# Patient Record
Sex: Female | Born: 1975 | Race: White | Hispanic: No | Smoking: Never smoker
Health system: Southern US, Community
[De-identification: ages and names within clinical notes are randomized; demographics above are authoritative.]

## PROBLEM LIST (undated history)

## (undated) DIAGNOSIS — F988 Other specified behavioral and emotional disorders with onset usually occurring in childhood and adolescence: Secondary | ICD-10-CM

## (undated) DIAGNOSIS — H35711 Central serous chorioretinopathy, right eye: Secondary | ICD-10-CM

## (undated) DIAGNOSIS — S92355A Nondisplaced fracture of fifth metatarsal bone, left foot, initial encounter for closed fracture: Secondary | ICD-10-CM

## (undated) DIAGNOSIS — F4310 Post-traumatic stress disorder, unspecified: Secondary | ICD-10-CM

## (undated) DIAGNOSIS — M779 Enthesopathy, unspecified: Secondary | ICD-10-CM

## (undated) DIAGNOSIS — T7840XA Allergy, unspecified, initial encounter: Secondary | ICD-10-CM

## (undated) DIAGNOSIS — E039 Hypothyroidism, unspecified: Secondary | ICD-10-CM

## (undated) DIAGNOSIS — J342 Deviated nasal septum: Secondary | ICD-10-CM

## (undated) DIAGNOSIS — D649 Anemia, unspecified: Secondary | ICD-10-CM

## (undated) DIAGNOSIS — F909 Attention-deficit hyperactivity disorder, unspecified type: Secondary | ICD-10-CM

## (undated) DIAGNOSIS — F329 Major depressive disorder, single episode, unspecified: Secondary | ICD-10-CM

## (undated) DIAGNOSIS — J45909 Unspecified asthma, uncomplicated: Secondary | ICD-10-CM

## (undated) DIAGNOSIS — F32A Depression, unspecified: Secondary | ICD-10-CM

## (undated) DIAGNOSIS — M069 Rheumatoid arthritis, unspecified: Secondary | ICD-10-CM

## (undated) DIAGNOSIS — J329 Chronic sinusitis, unspecified: Secondary | ICD-10-CM

## (undated) HISTORY — DX: Rheumatoid arthritis, unspecified: M06.9

## (undated) HISTORY — DX: Hypothyroidism, unspecified: E03.9

## (undated) HISTORY — DX: Central serous chorioretinopathy, right eye: H35.711

## (undated) HISTORY — PX: TONSILLECTOMY: SUR1361

## (undated) HISTORY — DX: Allergy, unspecified, initial encounter: T78.40XA

## (undated) HISTORY — DX: Post-traumatic stress disorder, unspecified: F43.10

## (undated) HISTORY — DX: Unspecified asthma, uncomplicated: J45.909

## (undated) HISTORY — DX: Deviated nasal septum: J34.2

## (undated) HISTORY — DX: Depression, unspecified: F32.A

## (undated) HISTORY — PX: BACK SURGERY: SHX140

## (undated) HISTORY — DX: Other specified behavioral and emotional disorders with onset usually occurring in childhood and adolescence: F98.8

## (undated) HISTORY — PX: DILATION AND CURETTAGE OF UTERUS: SHX78

## (undated) HISTORY — PX: JOINT REPLACEMENT: SHX530

## (undated) HISTORY — PX: SPINE SURGERY: SHX786

## (undated) HISTORY — DX: Attention-deficit hyperactivity disorder, unspecified type: F90.9

## (undated) HISTORY — DX: Nondisplaced fracture of fifth metatarsal bone, left foot, initial encounter for closed fracture: S92.355A

## (undated) HISTORY — DX: Anemia, unspecified: D64.9

---
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
## 1998-08-12 ENCOUNTER — Other Ambulatory Visit: Admission: RE | Admit: 1998-08-12 | Discharge: 1998-08-12 | Payer: Self-pay | Admitting: Obstetrics and Gynecology

## 1999-08-23 ENCOUNTER — Other Ambulatory Visit: Admission: RE | Admit: 1999-08-23 | Discharge: 1999-08-23 | Payer: Self-pay | Admitting: Obstetrics and Gynecology

## 1999-11-22 ENCOUNTER — Ambulatory Visit (HOSPITAL_COMMUNITY): Admission: RE | Admit: 1999-11-22 | Discharge: 1999-11-22 | Payer: Self-pay | Admitting: Obstetrics and Gynecology

## 1999-11-22 ENCOUNTER — Encounter (INDEPENDENT_AMBULATORY_CARE_PROVIDER_SITE_OTHER): Payer: Self-pay | Admitting: Specialist

## 2000-03-28 ENCOUNTER — Other Ambulatory Visit: Admission: RE | Admit: 2000-03-28 | Discharge: 2000-03-28 | Payer: Self-pay | Admitting: Obstetrics and Gynecology

## 2000-03-28 ENCOUNTER — Encounter (INDEPENDENT_AMBULATORY_CARE_PROVIDER_SITE_OTHER): Payer: Self-pay | Admitting: Specialist

## 2000-07-13 ENCOUNTER — Other Ambulatory Visit: Admission: RE | Admit: 2000-07-13 | Discharge: 2000-07-13 | Payer: Self-pay | Admitting: Obstetrics and Gynecology

## 2000-12-12 DIAGNOSIS — O1413 Severe pre-eclampsia, third trimester: Secondary | ICD-10-CM

## 2001-01-11 ENCOUNTER — Inpatient Hospital Stay (HOSPITAL_COMMUNITY): Admission: AD | Admit: 2001-01-11 | Discharge: 2001-01-14 | Payer: Self-pay | Admitting: Obstetrics and Gynecology

## 2001-03-26 ENCOUNTER — Other Ambulatory Visit: Admission: RE | Admit: 2001-03-26 | Discharge: 2001-03-26 | Payer: Self-pay | Admitting: Obstetrics and Gynecology

## 2003-01-03 ENCOUNTER — Other Ambulatory Visit: Admission: RE | Admit: 2003-01-03 | Discharge: 2003-01-03 | Payer: Self-pay | Admitting: Obstetrics and Gynecology

## 2003-01-12 ENCOUNTER — Inpatient Hospital Stay (HOSPITAL_COMMUNITY): Admission: AD | Admit: 2003-01-12 | Discharge: 2003-01-12 | Payer: Self-pay | Admitting: Obstetrics & Gynecology

## 2003-01-12 ENCOUNTER — Encounter: Payer: Self-pay | Admitting: Obstetrics & Gynecology

## 2003-06-18 ENCOUNTER — Inpatient Hospital Stay (HOSPITAL_COMMUNITY): Admission: AD | Admit: 2003-06-18 | Discharge: 2003-06-18 | Payer: Self-pay | Admitting: Obstetrics and Gynecology

## 2003-06-19 ENCOUNTER — Inpatient Hospital Stay (HOSPITAL_COMMUNITY): Admission: AD | Admit: 2003-06-19 | Discharge: 2003-06-19 | Payer: Self-pay | Admitting: Obstetrics and Gynecology

## 2003-07-09 ENCOUNTER — Encounter (INDEPENDENT_AMBULATORY_CARE_PROVIDER_SITE_OTHER): Payer: Self-pay | Admitting: Specialist

## 2003-07-09 ENCOUNTER — Inpatient Hospital Stay (HOSPITAL_COMMUNITY): Admission: AD | Admit: 2003-07-09 | Discharge: 2003-07-13 | Payer: Self-pay | Admitting: Obstetrics and Gynecology

## 2003-08-07 ENCOUNTER — Other Ambulatory Visit: Admission: RE | Admit: 2003-08-07 | Discharge: 2003-08-07 | Payer: Self-pay | Admitting: Obstetrics and Gynecology

## 2007-10-26 ENCOUNTER — Ambulatory Visit (HOSPITAL_COMMUNITY): Admission: RE | Admit: 2007-10-26 | Discharge: 2007-10-26 | Payer: Self-pay | Admitting: Obstetrics and Gynecology

## 2007-10-26 ENCOUNTER — Encounter (INDEPENDENT_AMBULATORY_CARE_PROVIDER_SITE_OTHER): Payer: Self-pay | Admitting: Obstetrics and Gynecology

## 2009-04-11 ENCOUNTER — Ambulatory Visit: Payer: Self-pay | Admitting: Oncology

## 2009-05-12 ENCOUNTER — Ambulatory Visit: Payer: Self-pay | Admitting: Oncology

## 2011-02-22 ENCOUNTER — Ambulatory Visit: Payer: Self-pay | Admitting: Oncology

## 2011-03-13 ENCOUNTER — Ambulatory Visit: Payer: Self-pay | Admitting: Oncology

## 2011-04-26 NOTE — Op Note (Signed)
[name], [name]               ACCOUNT NO.:  [id number]   MEDICAL RECORD NO.:  [id number]          PATIENT TYPE:  AMB   LOCATION:  SDC                           FACILITY:  [facility]   PHYSICIAN:  Lenoard Aden, M.D.DATE OF BIRTH:  [birth year]   DATE OF PROCEDURE:  10/26/2007  DATE OF DISCHARGE:                               OPERATIVE REPORT   PREOPERATIVE DIAGNOSIS:  Dysfunctional uterine bleeding with structural  lesion.   POSTOPERATIVE DIAGNOSIS:  Endometrial polyp.   PROCEDURE:  Diagnostic hysteroscopy, dilatation and curettage, resection  of endometrial polyp.   SURGEON:  Lenoard Aden, M.D.   ANESTHESIA:  General and local.   ESTIMATED BLOOD LOSS:  Less than 50 cc.   COMPLICATIONS:  None.   DRAINS:  None.   COUNTS:  Correct.   DISPOSITION:  Patient to recovery in good condition.   FLUID DEFICIT:  55 cc   DESCRIPTION OF PROCEDURE:  After being advised of the risks of  anesthesia, infection, bleeding, uterine perforation with possible  injury to other organs with need for repair, the patient was brought to  the operating room.  The patient was administered a general anesthetic  without complications.  Feet were placed in the [place] stirrups,  prepped and draped in the usual sterile fashion.  Catheter placed and  the bladder is emptied.  Exam under anesthesia reveals an anteflexed  size uterus and no adnexal masses.  The cervix is __________.  Paracervical block placed using dilute __________ solution in standard  fashion, 20 cc total, and a block is placed using Pitressin at 3 and 9  o'clock at the cervicovaginal junction, 16 cc total of dilute Pitressin  solution.  At this time the cervix was easily dilated with a #27 Pratt  dilator.  Hysteroscope placed.  Visualization reveals a thickening along  the posterior wall and a posterior wall polyp which is removed easily  with the right angle loop.  Multiple passes are taken to completely  remove the posterior  wall polyp.  D&C is performed in four-quadrant  method without difficulty.  Bilateral normal tubal ostia are noted.  Normal uterine cavity is approached after this.  At this time all  instruments  are removed under direct visualization.  Fluid deficit of 55 cc is  noted.  Minimal bleeding is noted.  All instruments are removed from the  vagina.  The patient tolerated the procedure well and is transferred to  recovery in good condition.      Lenoard Aden, M.D.  Electronically Signed     RJT/MEDQ  D:  10/26/2007  T:  10/27/2007  Job:  [id number]

## 2011-04-26 NOTE — H&P (Signed)
[name], [name]               ACCOUNT NO.:  [id number]   MEDICAL RECORD NO.:  [id number]          PATIENT TYPE:  AMB   LOCATION:  SDC                           FACILITY:  [facility]   PHYSICIAN:  Lenoard Aden, M.D.DATE OF BIRTH:  09/19/1976   DATE OF ADMISSION:  10/26/2007  DATE OF DISCHARGE:                              HISTORY & PHYSICAL   CHIEF COMPLAINT:  Dysfunctional uterine bleeding.   HISTORY:  She is a 35 year old white female G1 with a history of  irregular bleeding and questionable structural abnormality on  sonohysterography who presents for surgical intervention.   MEDICATIONS:  Her medications include Lexapro.   ALLERGIES:  PENICILLIN.   FAMILY HISTORY:  Of cancer, CVA and rheumatoid arthritis.   PERSONAL HISTORY:  Of cesarean section, LEEP for severe dysplasia.   SOCIAL HISTORY:  She is a nonsmoker and nondrinker.  She denies domestic  or physical violence.   PHYSICAL EXAMINATION:  GENERAL:  On physical examination she is a well-  developed, well-nourished white female in no acute distress.  VITAL SIGNS:  Weight of 187, blood pressure 106/82.  HEENT:  Normal.  LUNGS:  Clear.  HEART:  Regular rate and rhythm.  ABDOMEN:  Soft and nontender.  PELVIC:  Pelvic exam reveals a normal size uterus and no adnexal masses.   IMPRESSION:  Dysfunctional uterine bleeding with questionable structural  lesion, questionable endometrial polyp.   PLAN:  Proceed with diagnostic hysteroscopy, D and C, resectoscope,  risks of anesthesia, infection, bleeding, injury to abdominal organs,  need for repair is discussed.  Delayed versus immediate complications to  include bowel and bladder injury noted.  The patient now wishes to  proceed.      Lenoard Aden, M.D.  Electronically Signed     RJT/MEDQ  D:  10/25/2007  T:  10/26/2007  Job:  [id number]

## 2011-04-29 NOTE — Op Note (Signed)
NAMEJACKLIN, [name]                         ACCOUNT NO.:  [id number]   MEDICAL RECORD NO.:  [id number]                   PATIENT TYPE:  INP   LOCATION:  9126                                 FACILITY:  [facility]   PHYSICIAN:  Lenoard Aden, M.D.             DATE OF BIRTH:  [birth year]   DATE OF PROCEDURE:  07/09/2003  DATE OF DISCHARGE:                                 OPERATIVE REPORT   PREOPERATIVE DIAGNOSES:  1. Symptomatic placenta previa with bleeding.  2. A 36-week intrauterine pregnancy with mature L/S:CD ratio.   PROCEDURE:  Emergent low segment transverse cesarean section.   SURGEON:  Lenoard Aden, M.D.   ASSESSMENT:  Maxie Better, M.D.   ANESTHESIA:  Spinal by Octaviano Glow. Pamalee Leyden, M.D.   ESTIMATED BLOOD LOSS:  1500 mL.   COMPLICATIONS:  None.   DRAINS:  Foley.   COUNTS:  Correct.   PATHOLOGY:  Placenta.   Patient to recovery in good condition.   FINDINGS:  A full-term living female, 6 pounds 5 ounces, cord pH of 7.3, to  the newborn nursery.  Anterior complete placenta previa noted, normal tubes  and ovaries noted.   BRIEF OPERATIVE NOTE:  The patient was scheduled for a C-section today at  approximately 1:30.  At approximately 10:15 I received a phone call  regarding heavy bleeding from the patient.  The patient was moved to the  operating room urgently, whereby she was administered a quick spinal  anesthetic after documenting normal fetal heart tones in the 129 beat per  minute range.  After achieving adequate anesthesia, dilute Marcaine placed,  a Pfannenstiel skin incision made with a scalpel, carried down to fascia,  which was nicked and opened transversely using Mayo scissors.  Rectus  muscles dissected sharply off the fascial edge.  At this time the peritoneum  was entered bluntly, the bladder blade placed, visceral peritoneum scored in  a smiling fashion.  No evidence of placenta increta at this time.  The lower  uterine segment appears  well-developed.  The bladder flap is sharply  dissected off the lower uterine segment.  A Kerr hysterotomy incision is  made, revealing active bleeding from transplacental entry.  Transplacental  entry is made off the anterior abdominal wall into amniotic sac, which  reveals clear fluid, transverse positioning is noted.  The baby is converted  to vertex presentation and delivered atraumatically with one pull using a  Mityvac suction.  At this time the pediatricians are in attendance, Apgars  of 9 and 10 are assigned.  Cord blood is collected, cord pH is collected as  previously noted.  The uterus is exteriorized, curetted using a dry lap  pack, and closed in two layers using a 0 Monocryl suture.  Bleeding from the  right lateral aspect of the incision and the middle aspects of the incision  is controlled using interrupted sutures.  Bladder flap is inspected and  found to be hemostatic.  Irrigation is accomplished.  The paracolic gutters  are irrigated, all blood clots are subsequently removed.  Normal tubes and  ovaries are noted.  The uterus has been replaced in the abdominal cavity and  reveals good hemostasis at this time.  Fascia is then closed using a 0  Monocryl in continuous running fashion, skin closed using staples.  The  patient tolerates the procedure well and is transferred to recovery in good  condition.                                                Lenoard Aden, M.D.    RJT/MEDQ  D:  07/09/2003  T:  07/09/2003  Job:  [id number]
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
## 2011-04-29 NOTE — Discharge Summary (Signed)
   [name], [name]                         ACCOUNT NO.:  [id number]   MEDICAL RECORD NO.:  [id number]                   PATIENT TYPE:  INP   LOCATION:  9126                                 FACILITY:  [facility]   PHYSICIAN:  Lenoard Aden, M.D.             DATE OF BIRTH:  09-28-1976   DATE OF ADMISSION:  07/09/2003  DATE OF DISCHARGE:  07/13/2003                                 DISCHARGE SUMMARY   HOSPITAL COURSE:  The patient underwent an uncomplicated primary C-section  for placenta previa on July 09, 2003.  Postoperative anemia noted with a  hemoglobin of 8.1.  The patient was symptomatic, started on iron b.i.d.,  observed.  Bleeding stabilized.  Low-grade temperature normalized.  She was  discharged to home on day #4.  Macrobid, Tylox, iron sulfate b.i.d. given.  Discharge teaching done.  Follow up in the office in four weeks.                                               Lenoard Aden, M.D.    RJT/MEDQ  D:  07/28/2003  T:  07/28/2003  Job:  [id number]

## 2011-04-29 NOTE — H&P (Signed)
[facility] [place]  Patient:    [name]                       MRN: [id number] Adm. Date:  [id number] Attending:  Lenoard Aden                         History and Physical  CHIEF COMPLAINT:              Missed abortion.  HISTORY OF PRESENT ILLNESS:   The patient is a 35 year old white female, G1, P0 at 6 weeks with missed AB.  ALLERGIES:                    Remarkable for allergies to OXACILLINS, PENICILLIN, AMOXICILLIN.  MEDICATIONS:                  Prenatal vitamins.  SOCIAL HISTORY:               Noncontributory.  FAMILY HISTORY:               Noncontributory.  PAST MEDICAL HISTORY:         The patient had had medical hospitalization only or a tonsillectomy.  REVIEW OF SYSTEMS:            GYN review of systems negative.  PHYSICAL EXAMINATION:  GENERAL:                      The patient is a well-developed, well-nourished white female in no apparent distress.  HEENT:                        Normal.  LUNGS:                        Clear.  HEART:                        Regular rate and rhythm.  ABDOMEN:                      Soft, nontender.  PELVIC:                       Examination reveals a 6-8 week size uterus.  No adnexal masses.  EXTREMITIES:                  Reveal no cords.  NEUROLOGICAL:                 Nonfocal.  IMPRESSION:                   A six-week intrauterine pregnancy with fetal demise documented by ultrasound.  PLAN:                          Suction D&E.  Risk of anesthesia, infection, bleeding, uterine perforation, need for repair is discussed and the patient acknowledges and desires to proceed. DD:  11/22/99 TD:  11/22/99 Job: [id number]

## 2011-04-29 NOTE — H&P (Signed)
   [name], [name]                         ACCOUNT NO.:  [id number]   MEDICAL RECORD NO.:  [id number]                   PATIENT TYPE:  INP   LOCATION:  NA                                   [facility]ACILITY:  [facility]   PHYSICIAN:  Lenoard Aden, M.D.             DATE OF BIRTH:  06-04-1976   DATE OF ADMISSION:  07/08/2003  DATE OF DISCHARGE:                                HISTORY & PHYSICAL   CHIEF COMPLAINT:  Placenta previa.   HISTORY OF PRESENT ILLNESS:  A 35 year old white female G3, P1, EDD of  August 02, 2003 at 36 weeks status post amniocentesis with LS PG ratio of  1.4 and PT positive who presents for primary C-section to persistent  complete placenta previa at term.   ALLERGIES:  PENICILLIN.   MEDICATIONS:  Prenatal vitamins.   PAST MEDICAL HISTORY:  History of D&E for missed AB in 2000. History of  spontaneous vaginal delivery in 2002. She has no other medical or surgical  hospitalizations.   FAMILY HISTORY:  History of stroke, emphysema, hypertension, and diabetes.   LABORATORY DATA:  Prenatal lab data reveals a blood type of A positive, Rh  antibody negative, Rubella non-immune, hepatitis and HIV non-reactive. GBS  is negative.   PHYSICAL EXAMINATION:  GENERAL: She is a well developed, well nourished  white female in no acute distress.  HEENT: Normal.  LUNGS: Clear.  HEART: Regular rhythm.  ABDOMEN: Soft, gravid, and nontender.  PELVIC: Cervix is visually closed.  RECTAL: Digital examination is deferred.  EXTREMITIES: Show no cords.  NEUROLOGIC: Examination is non-focal.   IMPRESSION:  1. A 36 week intrauterine pregnancy.  2. Complete placenta previa with mature LS/PG ratio, for primary Cesarean     section.   PLAN:  The plan is to proceed with primary C-section.  The risks of  anesthesia, infection, bleeding, injury to abdominal organs, and need for  repair were discussed and postoperative immediate complications to include  bowel and bladder injury,  possible need for hysterectomy due to persistent  bleeding is discussed. The patient has already  acquired 2 units of blood from donor directed through the [facility] for the  possible need for transfusion. Transfusion risks and benefits are discussed  at this time. Risks of hepatitis and HIV are noted. The patient acknowledges  and wishes to proceed.                                               Lenoard Aden, M.D.    RJT/MEDQ  D:  07/08/2003  T:  07/08/2003  Job:  [id number]   cc:   [facility]

## 2011-09-20 LAB — CBC
HCT: 40.4
Hemoglobin: 14.3
MCHC: 35.3
MCV: 94.4
Platelets: 258
RBC: 4.28
RDW: 12.3
WBC: 7.2

## 2011-09-20 LAB — HCG, SERUM, QUALITATIVE: Preg, Serum: NEGATIVE

## 2011-10-08 ENCOUNTER — Ambulatory Visit: Payer: Self-pay | Admitting: Emergency Medicine

## 2012-01-24 ENCOUNTER — Ambulatory Visit: Payer: Self-pay | Admitting: General Practice

## 2012-05-17 ENCOUNTER — Ambulatory Visit: Payer: Self-pay | Admitting: Oncology

## 2013-12-03 ENCOUNTER — Emergency Department: Payer: Self-pay | Admitting: Emergency Medicine

## 2013-12-06 ENCOUNTER — Emergency Department: Payer: Self-pay | Admitting: Emergency Medicine

## 2014-12-12 DIAGNOSIS — M069 Rheumatoid arthritis, unspecified: Secondary | ICD-10-CM

## 2014-12-12 HISTORY — DX: Rheumatoid arthritis, unspecified: M06.9

## 2015-05-19 ENCOUNTER — Ambulatory Visit
Admission: RE | Admit: 2015-05-19 | Discharge: 2015-05-19 | Disposition: A | Discharge status: Home / Self Care | Payer: No Typology Code available for payment source | Source: Ambulatory Visit | Attending: Family Medicine | Admitting: Family Medicine

## 2015-05-19 ENCOUNTER — Ambulatory Visit
Admission: RE | Admit: 2015-05-19 | Discharge: 2015-05-19 | Disposition: A | Discharge status: Home / Self Care | Payer: No Typology Code available for payment source | Source: Ambulatory Visit | Attending: Physician Assistant | Admitting: Physician Assistant

## 2015-05-19 ENCOUNTER — Other Ambulatory Visit: Payer: Self-pay | Admitting: Physician Assistant

## 2015-05-19 DIAGNOSIS — S8990XA Unspecified injury of unspecified lower leg, initial encounter: Secondary | ICD-10-CM | POA: Insufficient documentation

## 2015-05-19 DIAGNOSIS — R52 Pain, unspecified: Secondary | ICD-10-CM

## 2015-05-19 DIAGNOSIS — M25569 Pain in unspecified knee: Secondary | ICD-10-CM | POA: Diagnosis present

## 2015-05-19 DIAGNOSIS — M25562 Pain in left knee: Secondary | ICD-10-CM | POA: Insufficient documentation

## 2015-06-12 DIAGNOSIS — G959 Disease of spinal cord, unspecified: Secondary | ICD-10-CM | POA: Insufficient documentation

## 2015-06-12 DIAGNOSIS — M4712 Other spondylosis with myelopathy, cervical region: Secondary | ICD-10-CM | POA: Insufficient documentation

## 2015-10-12 ENCOUNTER — Encounter: Payer: Self-pay | Admitting: Physician Assistant

## 2015-10-12 ENCOUNTER — Ambulatory Visit: Payer: Self-pay | Admitting: Physician Assistant

## 2015-10-12 VITALS — BP 120/70 | HR 95 | Temp 98.8°F

## 2015-10-12 DIAGNOSIS — J019 Acute sinusitis, unspecified: Secondary | ICD-10-CM

## 2015-10-12 MED ORDER — CEFDINIR 300 MG PO CAPS: 300.0000 mg | ORAL_CAPSULE | Freq: Two times a day (BID) | ORAL | Status: DC

## 2015-10-12 MED ORDER — FLUCONAZOLE 150 MG PO TABS: 150.0000 mg | ORAL_TABLET | Freq: Once | ORAL | Status: DC

## 2015-10-12 NOTE — Progress Notes (Signed)
S: C/o runny nose and congestion for 8-9 days, no fever, chills, cp/sob, v/d; mucus is green and thick, c/o of facial and dental pain. Scheduled for sinus surgery before xmas  Using otc meds:   O: PE: vitals wnl, nad,  perrl eomi, normocephalic, tms dull, nasal mucosa red and swollen, throat injected, neck supple no lymph, lungs c t a, cv rrr, neuro intact  A:  Acute sinusitis   P: omnicef 300mg  bid x 10d, diflucan 150mg  1 now and 1 in 1 week, repeat if needed drink fluids, continue regular meds , use otc meds of choice, return if not improving in 5 days, return earlier if worsening

## 2015-11-16 ENCOUNTER — Encounter: Payer: Self-pay | Admitting: *Deleted

## 2015-11-18 NOTE — Discharge Instructions (Signed)
[facility] °[facility] °ENDOSCOPIC SINUS SURGERY ° [facility] ° °What is Functional Endoscopic Sinus Surgery? ° The Surgery involves making the natural openings of the sinuses larger by removing the bony partitions that separate the sinuses from the nasal cavity.  The natural sinus lining is preserved as much as possible to allow the sinuses to resume normal function after the surgery.  In some patients nasal polyps (excessively swollen lining of the sinuses) may be removed to relieve obstruction of the sinus openings.  The surgery is performed through the nose using lighted scopes, which eliminates the need for incisions on the face.  A septoplasty is a different procedure which is sometimes performed with sinus surgery.  It involves straightening the boy partition that separates the two sides of your nose.  A crooked or deviated septum may need repair if is obstructing the sinuses or nasal airflow.  Turbinate reduction is also often performed during sinus surgery.  The turbinates are bony proturberances from the side walls of the nose which swell and can obstruct the nose in patients with sinus and allergy problems.  Their size can be surgically reduced to help relieve nasal obstruction. ° °What Can Sinus Surgery Do For Me? ° Sinus surgery can reduce the frequency of sinus infections requiring antibiotic treatment.  This can provide improvement in nasal congestion, post-nasal drainage, facial pressure and nasal obstruction.  Surgery will NOT prevent you from ever having an infection again, so it usually only for patients who get infections 4 or more times yearly requiring antibiotics, or for infections that do not clear with antibiotics.  It will not cure nasal allergies, so patients with allergies may still require medication to treat their allergies after surgery. Surgery may improve headaches related to sinusitis, however, some people will continue to  require medication to control sinus headaches related to allergies.  Surgery will do nothing for other forms of headache (migraine, tension or cluster). ° °What Are the Risks of Endoscopic Sinus Surgery? ° Current techniques allow surgery to be performed safely with little risk, however, there are rare complications that patients should be aware of.  Because the sinuses are located around the eyes, there is risk of eye injury, including blindness, though again, this would be quite rare. This is usually a result of bleeding behind the eye during surgery, which puts the vision oat risk, though there are treatments to protect the vision and prevent permanent disrupted by surgery causing a leak of the spinal fluid that surrounds the brain.  More serious complications would include bleeding inside the brain cavity or damage to the brain.  Again, all of these complications are uncommon, and spinal fluid leaks can be safely managed surgically if they occur.  The most common complication of sinus surgery is bleeding from the nose, which may require packing or cauterization of the nose.  Continued sinus have polyps may experience recurrence of the polyps requiring revision surgery.  Alterations of sense of smell or injury to the tear ducts are also rare complications.  ° °What is the Surgery Like, and what is the Recovery? ° The Surgery usually takes a couple of hours to perform, and is usually performed under a general anesthetic (completely asleep).  Patients are usually discharged home after a couple of hours.  Sometimes during surgery it is necessary to pack the nose to control bleeding, and the packing is left in place for 24 - 48 hours, and removed by your surgeon.    If a septoplasty was performed during the procedure, there is often a splint placed which must be removed after 5-7 days.   °Discomfort: Pain is usually mild to moderate, and can be controlled by prescription pain medication or acetaminophen (Tylenol).   Aspirin, Ibuprofen (Advil, Motrin), or Naprosyn (Aleve) should be avoided, as they can cause increased bleeding.  Most patients feel sinus pressure like they have a bad head cold for several days.  Sleeping with your head elevated can help reduce swelling and facial pressure, as can ice packs over the face.  A humidifier may be helpful to keep the mucous and blood from drying in the nose.  ° °Diet: There are no specific diet restrictions, however, you should generally start with clear liquids and a light diet of bland foods because the anesthetic can cause some nausea.  Advance your diet depending on how your stomach feels.  Taking your pain medication with food will often help reduce stomach upset which pain medications can cause. ° °Nasal Saline Irrigation: It is important to remove blood clots and dried mucous from the nose as it is healing.  This is done by having you irrigate the nose at least 3 - 4 times daily with a salt water solution.  We recommend using NeilMed Sinus Rinse (available at the drug store).  Fill the squeeze bottle with the solution, bend over a sink, and insert the tip of the squeeze bottle into the nose ½ of an inch.  Point the tip of the squeeze bottle towards the inside corner of the eye on the same side your irrigating.  Squeeze the bottle and gently irrigate the nose.  If you bend forward as you do this, most of the fluid will flow back out of the nose, instead of down your throat.   The solution should be warm, near body temperature, when you irrigate.   Each time you irrigate, you should use a full squeeze bottle.  ° °Note that if you are instructed to use Nasal Steroid Sprays at any time after your surgery, irrigate with saline BEFORE using the steroid spray, so you do not wash it all out of the nose. °Another product, Nasal Saline Gel (such as AYR Nasal Saline Gel) can be applied in each nostril 3 - 4 times daily to moisture the nose and reduce scabbing or crusting. ° °Bleeding:   Bloody drainage from the nose can be expected for several days, and patients are instructed to irrigate their nose frequently with salt water to help remove mucous and blood clots.  The drainage may be dark red or brown, though some fresh blood may be seen intermittently, especially after irrigation.  Do not blow you nose, as bleeding may occur. If you must sneeze, keep your mouth open to allow air to escape through your mouth. ° °If heavy bleeding occurs: Irrigate the nose with saline to rinse out clots, then spray the nose 3 - 4 times with Afrin Nasal Decongestant Spray.  The spray will constrict the blood vessels to slow bleeding.  Pinch the lower half of your nose shut to apply pressure, and lay down with your head elevated.  Ice packs over the nose may help as well. If bleeding persists despite these measures, you should notify your doctor.  Do not use the Afrin routinely to control nasal congestion after surgery, as it can result in worsening congestion and may affect healing.  ° ° ° °Activity: Return to work varies among patients. Most patients will be   out of work at least 5 - 7 days to recover.  Patient may return to work after they are off of narcotic pain medication, and feeling well enough to perform the functions of their job.  Patients must avoid heavy lifting (over 10 pounds) or strenuous physical for 2 weeks after surgery, so your employer may need to assign you to light duty, or keep you out of work longer if light duty is not possible.  NOTE: you should not drive, operate dangerous machinery, do any mentally demanding tasks or make any important legal or financial decisions while on narcotic pain medication and recovering from the general anesthetic.  °  °Call Your Doctor Immediately if You Have Any of the Following: °1. Bleeding that you cannot control with the above measures °2. Loss of vision, double vision, bulging of the eye or black eyes. °3. Fever over 101 degrees °4. Neck stiffness with  severe headache, fever, nausea and change in mental state. °You are always encourage to call anytime with concerns, however, please call with requests for pain medication refills during office hours. ° °Office Endoscopy: During follow-up visits your doctor will remove any packing or splints that may have been placed and evaluate and clean your sinuses endoscopically.  Topical anesthetic will be used to make this as comfortable as possible, though you may want to take your pain medication prior to the visit.  How often this will need to be done varies from patient to patient.  After complete recovery from the surgery, you may need follow-up endoscopy from time to time, particularly if there is concern of recurrent infection or nasal polyps. ° °General Anesthesia, Adult, Care After °Refer to this sheet in the next few weeks. These instructions provide you with information on caring for yourself after your procedure. Your health care provider may also give you more specific instructions. Your treatment has been planned according to current medical practices, but problems sometimes occur. Call your health care provider if you have any problems or questions after your procedure. °WHAT TO EXPECT AFTER THE PROCEDURE °After the procedure, it is typical to experience: °· Sleepiness. °· Nausea and vomiting. °HOME CARE INSTRUCTIONS °· For the first 24 hours after general anesthesia: °¨ Have a responsible person with you. °¨ Do not drive a car. If you are alone, do not take public transportation. °¨ Do not drink alcohol. °¨ Do not take medicine that has not been prescribed by your health care provider. °¨ Do not sign important papers or make important decisions. °¨ You may resume a normal diet and activities as directed by your health care provider. °· Change bandages (dressings) as directed. °· If you have questions or problems that seem related to general anesthesia, call the hospital and ask for the anesthetist or  anesthesiologist on call. °SEEK MEDICAL CARE IF: °· You have nausea and vomiting that continue the day after anesthesia. °· You develop a rash. °SEEK IMMEDIATE MEDICAL CARE IF:  °· You have difficulty breathing. °· You have chest pain. °· You have any allergic problems. °  °This information is not intended to replace advice given to you by your health care provider. Make sure you discuss any questions you have with your health care provider. °  °Document Released: 03/06/2001 Document Revised: 12/19/2014 Document Reviewed: 03/28/2012 °Elsevier Interactive Patient Education ©2016 [employer] ° °

## 2015-11-19 ENCOUNTER — Ambulatory Visit: Payer: PRIVATE HEALTH INSURANCE | Admitting: Anesthesiology

## 2015-11-19 ENCOUNTER — Ambulatory Visit
Admission: RE | Admit: 2015-11-19 | Discharge: 2015-11-19 | Disposition: A | Discharge status: Home / Self Care | Payer: PRIVATE HEALTH INSURANCE | Source: Ambulatory Visit | Attending: Otolaryngology | Admitting: Otolaryngology

## 2015-11-19 ENCOUNTER — Encounter
Admission: RE | Disposition: A | Discharge status: Home / Self Care | Payer: Self-pay | Source: Ambulatory Visit | Attending: Otolaryngology

## 2015-11-19 ENCOUNTER — Encounter: Payer: Self-pay | Admitting: *Deleted

## 2015-11-19 DIAGNOSIS — J342 Deviated nasal septum: Secondary | ICD-10-CM | POA: Diagnosis not present

## 2015-11-19 DIAGNOSIS — J320 Chronic maxillary sinusitis: Secondary | ICD-10-CM | POA: Diagnosis not present

## 2015-11-19 DIAGNOSIS — J323 Chronic sphenoidal sinusitis: Secondary | ICD-10-CM | POA: Insufficient documentation

## 2015-11-19 DIAGNOSIS — Z880 Allergy status to penicillin: Secondary | ICD-10-CM | POA: Insufficient documentation

## 2015-11-19 DIAGNOSIS — J321 Chronic frontal sinusitis: Secondary | ICD-10-CM | POA: Insufficient documentation

## 2015-11-19 DIAGNOSIS — J82 Pulmonary eosinophilia, not elsewhere classified: Secondary | ICD-10-CM | POA: Insufficient documentation

## 2015-11-19 HISTORY — PX: FRONTAL SINUS EXPLORATION: SHX6591

## 2015-11-19 HISTORY — PX: MAXILLARY ANTROSTOMY: SHX2003

## 2015-11-19 HISTORY — DX: Chronic sinusitis, unspecified: J32.9

## 2015-11-19 HISTORY — PX: IMAGE GUIDED SINUS SURGERY: SHX6570

## 2015-11-19 HISTORY — DX: Enthesopathy, unspecified: M77.9

## 2015-11-19 HISTORY — PX: SEPTOPLASTY: SHX2393

## 2015-11-19 HISTORY — PX: SPHENOIDECTOMY: SHX2421

## 2015-11-19 SURGERY — SINUS SURGERY, WITH IMAGING GUIDANCE
Anesthesia: General | Site: Nose | Wound class: Clean Contaminated

## 2015-11-19 MED ORDER — FENTANYL CITRATE (PF) 100 MCG/2ML IJ SOLN
INTRAMUSCULAR | Status: DC | PRN
  Administered 2015-11-19: 100 ug via INTRAVENOUS
  Administered 2015-11-19 (×3): 50 ug via INTRAVENOUS

## 2015-11-19 MED ORDER — MIDAZOLAM HCL 5 MG/5ML IJ SOLN
INTRAMUSCULAR | Status: DC | PRN
  Administered 2015-11-19: 2 mg via INTRAVENOUS

## 2015-11-19 MED ORDER — LACTATED RINGERS IV SOLN
INTRAVENOUS | Status: DC
  Administered 2015-11-19: 09:00:00 via INTRAVENOUS

## 2015-11-19 MED ORDER — ACETAMINOPHEN 10 MG/ML IV SOLN
1000.0000 mg | Freq: Once | INTRAVENOUS | Status: AC
  Administered 2015-11-19: 1000 mg via INTRAVENOUS

## 2015-11-19 MED ORDER — OXYMETAZOLINE HCL 0.05 % NA SOLN
2.0000 | Freq: Once | NASAL | Status: AC
  Administered 2015-11-19: 2 via NASAL

## 2015-11-19 MED ORDER — LIDOCAINE HCL (CARDIAC) 20 MG/ML IV SOLN
INTRAVENOUS | Status: DC | PRN
  Administered 2015-11-19: 40 mg via INTRAVENOUS

## 2015-11-19 MED ORDER — PROPOFOL 10 MG/ML IV BOLUS
INTRAVENOUS | Status: DC | PRN
  Administered 2015-11-19: 150 mg via INTRAVENOUS

## 2015-11-19 MED ORDER — OXYCODONE HCL 5 MG PO TABS
5.0000 mg | ORAL_TABLET | ORAL | Status: DC | PRN
  Administered 2015-11-19: 5 mg via ORAL

## 2015-11-19 MED ORDER — CEFAZOLIN SODIUM 1-5 GM-% IV SOLN
1.0000 g | Freq: Once | INTRAVENOUS | Status: AC
  Administered 2015-11-19: 1 g via INTRAVENOUS

## 2015-11-19 MED ORDER — ONDANSETRON HCL 4 MG/2ML IJ SOLN: 4.0000 mg | Freq: Once | INTRAMUSCULAR | Status: DC | PRN

## 2015-11-19 MED ORDER — ROCURONIUM BROMIDE 100 MG/10ML IV SOLN
INTRAVENOUS | Status: DC | PRN
  Administered 2015-11-19: 25 mg via INTRAVENOUS

## 2015-11-19 MED ORDER — PHENYLEPHRINE HCL 0.5 % NA SOLN
NASAL | Status: DC | PRN
  Administered 2015-11-19: 30 mL via TOPICAL

## 2015-11-19 MED ORDER — GLYCOPYRROLATE 0.2 MG/ML IJ SOLN
INTRAMUSCULAR | Status: DC | PRN
  Administered 2015-11-19: 0.1 mg via INTRAVENOUS

## 2015-11-19 MED ORDER — FENTANYL CITRATE (PF) 100 MCG/2ML IJ SOLN: 25.0000 ug | INTRAMUSCULAR | Status: DC | PRN

## 2015-11-19 MED ORDER — DEXAMETHASONE SODIUM PHOSPHATE 4 MG/ML IJ SOLN
INTRAMUSCULAR | Status: DC | PRN
  Administered 2015-11-19: 10 mg via INTRAVENOUS

## 2015-11-19 MED ORDER — LIDOCAINE-EPINEPHRINE 1 %-1:100000 IJ SOLN
INTRAMUSCULAR | Status: DC | PRN
  Administered 2015-11-19: 6 mL
  Administered 2015-11-19: 5 mL

## 2015-11-19 SURGICAL SUPPLY — 47 items
BALLOON SINUPLASTY SYSTEM (BALLOONS) ×1 IMPLANT
BATTERY INSTRU NAVIGATION (MISCELLANEOUS) ×16 IMPLANT
BLADE SURG 15 STRL LF DISP TIS (BLADE) IMPLANT
BLADE SURG 15 STRL SS (BLADE)
BTRY SRG DRVR LF (MISCELLANEOUS) ×12
CANISTER SUCT 1200ML W/VALVE (MISCELLANEOUS) ×4 IMPLANT
CATH IV 18X1 1/4 SAFELET (CATHETERS) ×4 IMPLANT
COAG SUCT 10F 3.5MM HAND CTRL (MISCELLANEOUS) ×3 IMPLANT
COAGULATOR SUCT 8FR VV (MISCELLANEOUS) ×1 IMPLANT
DEVICE INFLATION SEID (MISCELLANEOUS) ×1 IMPLANT
DRAPE HEAD BAR (DRAPES) ×4 IMPLANT
DRESSING NASL FOAM PST OP SINU (MISCELLANEOUS) IMPLANT
DRSG NASAL 4CM NASOPORE (MISCELLANEOUS) IMPLANT
DRSG NASAL FOAM POST OP SINU (MISCELLANEOUS)
GLOVE PI ULTRA LF STRL 7.5 (GLOVE) ×6 IMPLANT
GLOVE PI ULTRA NON LATEX 7.5 (GLOVE) ×3
IRRIGATOR 4MM STR (IRRIGATION / IRRIGATOR) ×4 IMPLANT
IV CATH 18X1 1/4 SAFELET (CATHETERS) ×3
IV NS 500ML (IV SOLUTION) ×4
IV NS 500ML BAXH (IV SOLUTION) ×3 IMPLANT
KIT ROOM TURNOVER OR (KITS) ×4 IMPLANT
NAVIGATION MASK REG  ST (MISCELLANEOUS) ×4 IMPLANT
NDL HYPO 25GX1X1/2 BEV (NEEDLE) ×3 IMPLANT
NDL SPNL 25GX3.5 QUINCKE BL (NEEDLE) IMPLANT
NEEDLE HYPO 25GX1X1/2 BEV (NEEDLE) ×4 IMPLANT
NEEDLE SPNL 25GX3.5 QUINCKE BL (NEEDLE) IMPLANT
NS IRRIG 500ML POUR BTL (IV SOLUTION) ×4 IMPLANT
PACK DRAPE NASAL/ENT (PACKS) ×4 IMPLANT
PACKING NASAL EPIS 4X2.4 XEROG (MISCELLANEOUS) ×2 IMPLANT
PAD GROUND ADULT SPLIT (MISCELLANEOUS) ×4 IMPLANT
PATTIES SURGICAL .5 X3 (DISPOSABLE) ×4 IMPLANT
SET HANDPIECE IRR DIEGO (MISCELLANEOUS) ×4 IMPLANT
SOL ANTI-FOG 6CC FOG-OUT (MISCELLANEOUS) ×3 IMPLANT
SOL FOG-OUT ANTI-FOG 6CC (MISCELLANEOUS) ×1
SPLINT NASAL SEPTAL BLV .50 ST (MISCELLANEOUS) ×3 IMPLANT
STRAP BODY AND KNEE 60X3 (MISCELLANEOUS) ×5 IMPLANT
SUT CHROMIC 3-0 (SUTURE) ×4
SUT CHROMIC 3-0 KS 27XMFL CR (SUTURE) ×3
SUT ETHILON 3-0 KS 30 BLK (SUTURE) ×4 IMPLANT
SUT ETHILON 4-0 (SUTURE)
SUT ETHILON 4-0 FS2 18XMFL BLK (SUTURE)
SUT PLAIN GUT 4-0 (SUTURE) ×4 IMPLANT
SUTURE CHRMC 3-0 KS 27XMFL CR (SUTURE) ×3 IMPLANT
SUTURE ETHLN 4-0 FS2 18XMF BLK (SUTURE) IMPLANT
SYR 3ML LL SCALE MARK (SYRINGE) ×4 IMPLANT
TOWEL OR 17X26 4PK STRL BLUE (TOWEL DISPOSABLE) ×4 IMPLANT
WATER STERILE IRR 500ML POUR (IV SOLUTION) ×4 IMPLANT

## 2015-11-19 NOTE — H&P (Signed)
  H&P has been reviewed and no changes necessary. To be downloaded later. 

## 2015-11-19 NOTE — Op Note (Signed)
11/19/2015  12:51 PM    Harper, Theresa Bamberg  YN:[id number]   Pre-Op Dx:  Septal deviation, chronic bilateral frontal sinusitis, bilateral maxillary sinusitis, bilateral sphenoid sinusitis.  Post-op Dx: Same  Proc: Septoplasty, bilateral endoscopic frontal sinusotomies, bilateral endoscopic sphenoid sinusotomies, bilateral endoscopic maxillary antrostomies, outfracture inferior turbinates, use of image guided system   Surg:  , H  Anes:  GOT  EBL:  100 mL  Comp:  None  Findings:  Inflamed tissues in the middle meatus and around the opening as the maxillary frontals and sphenoid sinuses. Extremely deviated septum to the right side were was buckled upon itself.  Procedure: Patient was given general anesthesia by oral endotracheal intubation. Her nose was prepped using 4-1/2 mL 1% Xylocaine with epi 100,000 for infiltration of the septum and lateral nasal walls. Adenoid pledgets were then placed in the nostrils on both sides after soaking them with 1% Xylocaine with epi 1:100,000. The image guided system was then brought in and the CT scan was downloaded from the disc. The template was applied the face and registered to the system as well. There is 0.6 mm of variance. The suction attachments were then registered and checked there was good alignment bilaterally. She was prepped and draped sterile fashion.  The 0 scope was used first to visualize the nose and passages on both sides septum was markedly deviated to the right side with narrowed airway there. There is big for the maxillary crest into the left side. The septum was addressed first. A left Killian incision was created with elevation of mucoperichondrium on the left side of the quadrangular plate cartilage was buckled over on itself in a J-shaped. The mucoperiosteum was then elevated over the maxillary crest and vomer. The bony cartilaginous junction was split and the mucoperiosteum was elevated over the right side of the vomer and  ethmoid plate. Some of the crooked ethmoid plate was removed to allow the regular plate to swing back toward towards normal position. Some of the posterior inferior quadrangular plate was trimmed as well so was overhanging the maxillary crest. The mucoperiosteum was elevated over the maxillary crest so that some the crooked crest was fractured and removed.  the very deviated vomer was removed as well. The mucosal flaps are then placed back in anatomic position and were sitting in the midline. There is a tear along the spur posteriorly and inferiorly on both sides.  The septum was sutured used a 4 plain gut suture in a through and through whip stitch fashion. This was used to close the [place] incision as well. This also was used to close the tears mucosa inferiorly and posteriorly. The 0 scope was then used to visualize both sides the nose in the airway was clear to the back of the turbinates were slightly enlarged. The inferior turbinates were outfractured to create more visualization room in the nose.  With the 0 scope and the left side the middle turbinate was infractured to evaluate the middle meatus better. The uncinate process was incised with a side biter and then removed with through biting forceps. The 30 scope was used to visualize the axillary antrum and this was widened using through biting forceps and the [facility] microdebrider. While was opening this the bulla of the ethmoid sinus was opened into and this was widened to make sure it would not scar over. The 30 scope was used to visualize anteriorly where the anterior portion of the uncinate process was removed. The a Clarence balloon system was used for  tracking into the frontal sinus duct. The balloon was threaded over the thin lighted wire and then dilated to 12 cm of pressure. When the balloon was removed all forceps were used to remove some tissue around the opening to the frontal sinus duct make sure that this remained clear. The middle  turbinate was then infractured to visualize the ureter turbinate. Inferior and posterior to the edge to the and superior turbinate the opening to the sphenoid sinus was found. This was extremely small. The a Clarence balloon system wire was placed into it and the balloon was then threaded over it dilated to 8 cm of pressure. This opened up the sinus opening further and opening was widened with a small through cutting forceps. The middle turbinate was then infractured again and a cottonoid pledget was placed on the left side to help with vasoconstriction.  The 0 scope was then used to visualize the right side and the procedure was repeated. The middle turbinate was infractured and the uncinate process was incised. The uncinate process was removed with through biting forceps and the [facility] microdebrider. The maxillary antrum was widened with the debrider as well as through biting forceps. The frontal sinus duct was located with the a Clarence balloon system and the balloon was threaded over the wire. It was dilated to 12 cm pressure and then the infant was so widened and bone chips removed using through biting forceps. Again the ethmoid bulla was opened up and cleaned out but the rest of the ethmoid sinuses were not addressed. The middle turbinate was infractured to again visualize the superior turbinate and the opening to the sphenoid sinus. He'll be was hidden but the wire was able to thread through this and then the balloon was threaded over it to widen the opening to the sphenoid sinus. It was dilated to 8 cm. The opening was then widened with through biting forceps was well. The middle turbinate was infractured and a cottonoid pledget was placed in the meatus for vasoconstriction.  The left side was revisited and is good opening into the maxillary antrum, frontal sinus, and the sphenoid sinus. The ethmoid was only partially opened at the ethmoid bulla but the rest was not addressed. Xerogel was placed at the  opening to the frontal sinus duct as well as the maxillary sinus area and help keep the middle turbinate medialized. This was liquefied. The right side was revisualized and again the maxillary frontal and sphenoid sinus openings were clear. The middle turbinate was only partially opened at the ethmoid bulla but the rest was not addressed. Xerogel was again placed the opening to the frontal maxillary sinuses.  Xomed 0.5 mm regular sized splints were then trimmed and placed on both sides nasal septum to help hold it in the midline. These were sewn together with a through and through suture of 3-0 nylon. The patient was awakened and taken to the recovery room in satisfactory condition. There were no operative complications. The 0 scope was placed through the nose both sides there is good airway inferiorly bilaterally.  Dispo:   To PACU to be discharged home  Plan:  To be followed up in the office in 6 days for splint removal. She'll rest at home with her head elevated. She'll use antibodies prednisone and help keep swelling down and prevent infection. She'll use pain medication as necessary. She'll start saline flushes tomorrow and use those frequently until I see her back in 6 days. Call if problems  , H  11/19/2015 12:51 PM

## 2015-11-19 NOTE — Anesthesia Preprocedure Evaluation (Addendum)
Anesthesia Evaluation  Patient identified by MRN, date of birth, ID band  Reviewed: Allergy & Precautions, H&P , NPO status , Patient's Chart, lab work & pertinent test results  Airway Mallampati: II  TM Distance: >3 FB Neck ROM: full    Dental no notable dental hx.    Pulmonary    Pulmonary exam normal        Cardiovascular  Rhythm:regular Rate:Normal     Neuro/Psych    GI/Hepatic   Endo/Other    Renal/GU      Musculoskeletal   Abdominal   Peds  Hematology   Anesthesia Other Findings   Reproductive/Obstetrics                             Anesthesia Physical Anesthesia Plan  ASA: II  Anesthesia Plan: General ETT   Post-op Pain Management:    Induction:   Airway Management Planned:   Additional Equipment:   Intra-op Plan:   Post-operative Plan:   Informed Consent: I have reviewed the patients History and Physical, chart, labs and discussed the procedure including the risks, benefits and alternatives for the proposed anesthesia with the patient or authorized representative who has indicated his/her understanding and acceptance.     Plan Discussed with: CRNA  Anesthesia Plan Comments:         Anesthesia Quick Evaluation  

## 2015-11-19 NOTE — Anesthesia Procedure Notes (Signed)
Procedure Name: Intubation Date/Time: 11/19/2015 10:00 AM Performed by: Mayme Genta Pre-anesthesia Checklist: Patient identified, Emergency Drugs available, Suction available, Patient being monitored and Timeout performed Patient Re-evaluated:Patient Re-evaluated prior to inductionOxygen Delivery Method: Circle system utilized Preoxygenation: Pre-oxygenation with 100% oxygen Intubation Type: IV induction Ventilation: Mask ventilation without difficulty Laryngoscope Size: Miller and 2 Grade View: Grade I Tube type: Oral Rae Tube size: 7.0 mm Number of attempts: 1 Placement Confirmation: ETT inserted through vocal cords under direct vision,  positive ETCO2 and breath sounds checked- equal and bilateral Tube secured with: Tape Dental Injury: Teeth and Oropharynx as per pre-operative assessment

## 2015-11-19 NOTE — Transfer of Care (Signed)
Immediate Anesthesia Transfer of Care Note  Patient: [name]  Procedure(s) Performed: Procedure(s) with comments: IMAGE GUIDED SINUS SURGERY (N/A) - GAVE DISK TO CE CE 09/22 SEPTOPLASTY (N/A) MAXILLARY ANTROSTOMY (Bilateral) FRONTAL SINUS EXPLORATION (Bilateral) SPHENOIDECTOMY (Bilateral)  Patient Location: PACU  Anesthesia Type: General ETT  Level of Consciousness: awake, alert  and patient cooperative  Airway and Oxygen Therapy: Patient Spontanous Breathing and Patient connected to supplemental oxygen  Post-op Assessment: Post-op Vital signs reviewed, Patient's Cardiovascular Status Stable, Respiratory Function Stable, Patent Airway and No signs of Nausea or vomiting  Post-op Vital Signs: Reviewed and stable  Complications: No apparent anesthesia complications

## 2015-11-19 NOTE — Anesthesia Postprocedure Evaluation (Signed)
Anesthesia Post Note  Patient: Maudena Bontemps Bressi  Procedure(s) Performed: Procedure(s) (LRB): IMAGE GUIDED SINUS SURGERY (N/A) SEPTOPLASTY (N/A) MAXILLARY ANTROSTOMY (Bilateral) FRONTAL SINUS EXPLORATION (Bilateral) SPHENOIDECTOMY (Bilateral)  Patient location during evaluation: PACU Anesthesia Type: General Level of consciousness: awake and alert and oriented Pain management: satisfactory to patient Vital Signs Assessment: post-procedure vital signs reviewed and stable Respiratory status: spontaneous breathing, nonlabored ventilation and respiratory function stable Cardiovascular status: blood pressure returned to baseline and stable Postop Assessment: Adequate PO intake and No signs of nausea or vomiting Anesthetic complications: no    Raliegh Ip

## 2015-11-20 ENCOUNTER — Encounter: Payer: Self-pay | Admitting: Otolaryngology

## 2015-11-23 LAB — SURGICAL PATHOLOGY

## 2015-12-31 ENCOUNTER — Ambulatory Visit: Payer: Self-pay | Admitting: Physician Assistant

## 2015-12-31 ENCOUNTER — Encounter: Payer: Self-pay | Admitting: Physician Assistant

## 2015-12-31 VITALS — BP 110/62 | HR 72 | Temp 97.7°F

## 2015-12-31 DIAGNOSIS — J209 Acute bronchitis, unspecified: Secondary | ICD-10-CM

## 2015-12-31 DIAGNOSIS — G4700 Insomnia, unspecified: Secondary | ICD-10-CM

## 2015-12-31 MED ORDER — FLUCONAZOLE 150 MG PO TABS: 150.0000 mg | ORAL_TABLET | Freq: Once | ORAL | Status: DC

## 2015-12-31 MED ORDER — IPRATROPIUM-ALBUTEROL 0.5-2.5 (3) MG/3ML IN SOLN: 3.0000 mL | RESPIRATORY_TRACT | Status: DC | PRN

## 2015-12-31 MED ORDER — AZITHROMYCIN 250 MG PO TABS: ORAL_TABLET | ORAL | Status: DC

## 2015-12-31 MED ORDER — IPRATROPIUM-ALBUTEROL 0.5-2.5 (3) MG/3ML IN SOLN
3.0000 mL | Freq: Once | RESPIRATORY_TRACT | Status: AC
  Administered 2015-12-31: 3 mL via RESPIRATORY_TRACT

## 2015-12-31 MED ORDER — METHYLPREDNISOLONE 4 MG PO TBPK
ORAL_TABLET | ORAL | Status: DC
Start: 2015-12-31 — End: 2016-02-19

## 2015-12-31 MED ORDER — TRAZODONE HCL 150 MG PO TABS: 150.0000 mg | ORAL_TABLET | Freq: Every day | ORAL | Status: DC

## 2015-12-31 NOTE — Progress Notes (Signed)
S: C/o cough and congestion with wheezing and chest pain, chest is sore from coughing, denies fever, chills: mucus is green, cough is  keeping pt awake at night;  denies cardiac type chest pain or sob, v/d, abd pain Remainder ros neg  O: vitals wnl, nad, tms clear, throat injected, neck supple no lymph, lungs with decreased bs, cv rrr, neuro intact, duoneb given, pt states feels better, lungs c t a,   A:  Acute bronchitis   P:  rx medication:  Zpack, medrol dose pack, duoneb nebules (pt has machine at home); refill on trazadone;  use otc meds, tylenol or motrin as needed for fever/chills, return if not better in 3 -5 days, return earlier if worsening

## 2016-01-11 ENCOUNTER — Encounter: Payer: Self-pay | Admitting: Physician Assistant

## 2016-01-11 ENCOUNTER — Ambulatory Visit: Payer: Self-pay | Admitting: Physician Assistant

## 2016-01-11 VITALS — BP 110/70 | HR 80 | Temp 98.4°F

## 2016-01-11 DIAGNOSIS — J069 Acute upper respiratory infection, unspecified: Secondary | ICD-10-CM

## 2016-01-11 MED ORDER — FLUCONAZOLE 150 MG PO TABS: 150.0000 mg | ORAL_TABLET | Freq: Once | ORAL | Status: DC

## 2016-01-11 MED ORDER — LEVOFLOXACIN 500 MG PO TABS: 500.0000 mg | ORAL_TABLET | Freq: Every day | ORAL | Status: DC

## 2016-01-11 NOTE — Progress Notes (Signed)
S: not feeling better, got just a little better while on zpack and steroid pack but 2 days later the sx returned, no fever/chills, cough is dry, has some wheezing, using nebulizer at home  O: vitals wnl, nad, lungs c t a, cv rrr  A: acute uri  P: levaquin, diflucan

## 2016-02-19 ENCOUNTER — Encounter: Payer: Self-pay | Admitting: Physician Assistant

## 2016-02-19 ENCOUNTER — Ambulatory Visit: Payer: Self-pay | Admitting: Physician Assistant

## 2016-02-19 VITALS — BP 123/83 | HR 96 | Temp 98.3°F

## 2016-02-19 DIAGNOSIS — Z299 Encounter for prophylactic measures, unspecified: Secondary | ICD-10-CM

## 2016-02-19 DIAGNOSIS — M79643 Pain in unspecified hand: Secondary | ICD-10-CM

## 2016-02-19 DIAGNOSIS — J018 Other acute sinusitis: Secondary | ICD-10-CM

## 2016-02-19 DIAGNOSIS — J069 Acute upper respiratory infection, unspecified: Secondary | ICD-10-CM

## 2016-02-19 MED ORDER — CEFDINIR 300 MG PO CAPS: 300.0000 mg | ORAL_CAPSULE | Freq: Two times a day (BID) | ORAL | Status: DC

## 2016-02-19 MED ORDER — FLUCONAZOLE 150 MG PO TABS: ORAL_TABLET | ORAL | Status: DC

## 2016-02-19 NOTE — Progress Notes (Signed)
S: C/o runny nose and congestion for 3 days, no fever, chills, cp/sob, v/d; mucus is green and thick, cough is sporadic, c/o of facial and dental pain at left max sinus, also joint pain in hands at thumbs, mother and grandmother both have RA, ? If she has the same, pain when pressure applied to both areas, also ?if could get yearly labs today  O: PE: vitals wnl, nad, perrl eomi, normocephalic, tms dull, nasal mucosa red and swollen, throat injected, neck supple no lymph, lungs c t a, cv rrr, neuro intact, both hands with swollen joints at pip of thumbs, no redness noted  A:  Acute sinusitis, joint pain with family hx of RA   P: omnicef 300mg  bid, diflucan, drink fluids, continue regular meds , use otc meds of choice, return if not improving in 5 days, return earlier if worsening , llabs drawn today, exec panel, vit d, sed rate, RA, ANA, b12 and folate, will refer to rheumatology due to family hx and sx

## 2016-02-20 LAB — VITAMIN D 25 HYDROXY (VIT D DEFICIENCY, FRACTURES): Vit D, 25-Hydroxy: 40.5 ng/mL (ref 30.0–100.0)

## 2016-02-20 LAB — CMP12+LP+TP+TSH+6AC+CBC/D/PLT
ALT: 20 IU/L (ref 0–32)
AST: 24 IU/L (ref 0–40)
Albumin/Globulin Ratio: 2 (ref 1.1–2.5)
Albumin: 4.5 g/dL (ref 3.5–5.5)
Alkaline Phosphatase: 38 IU/L — ABNORMAL LOW (ref 39–117)
BUN/Creatinine Ratio: 11 (ref 9–23)
BUN: 13 mg/dL (ref 6–24)
Basophils Absolute: 0 10*3/uL (ref 0.0–0.2)
Basos: 1 %
Bilirubin Total: 0.7 mg/dL (ref 0.0–1.2)
Calcium: 9.3 mg/dL (ref 8.7–10.2)
Chloride: 101 mmol/L (ref 96–106)
Chol/HDL Ratio: 2.8 ratio units (ref 0.0–4.4)
Cholesterol, Total: 205 mg/dL — ABNORMAL HIGH (ref 100–199)
Creatinine, Ser: 1.19 mg/dL — ABNORMAL HIGH (ref 0.57–1.00)
EOS (ABSOLUTE): 0 10*3/uL (ref 0.0–0.4)
Eos: 0 %
Estimated CHD Risk: <0.5 times avg. (ref 0.0–1.0)
Free Thyroxine Index: 3 (ref 1.2–4.9)
GFR calc Af Amer: 66 mL/min/{1.73_m2} (ref >59)
GFR calc non Af Amer: 57 mL/min/{1.73_m2} — ABNORMAL LOW (ref >59)
GGT: 13 IU/L (ref 0–60)
Globulin, Total: 2.2 g/dL (ref 1.5–4.5)
Glucose: 87 mg/dL (ref 65–99)
HDL: 72 mg/dL (ref >39)
Hematocrit: 38.7 % (ref 34.0–46.6)
Hemoglobin: 12.7 g/dL (ref 11.1–15.9)
Immature Grans (Abs): 0 10*3/uL (ref 0.0–0.1)
Immature Granulocytes: 0 %
Iron: 137 ug/dL (ref 27–159)
LDH: 153 IU/L (ref 119–226)
LDL Calculated: 122 mg/dL — ABNORMAL HIGH (ref 0–99)
Lymphocytes Absolute: 2 10*3/uL (ref 0.7–3.1)
Lymphs: 44 %
MCH: 32 pg (ref 26.6–33.0)
MCHC: 32.8 g/dL (ref 31.5–35.7)
MCV: 98 fL — ABNORMAL HIGH (ref 79–97)
Monocytes Absolute: 0.2 10*3/uL (ref 0.1–0.9)
Monocytes: 5 %
Neutrophils Absolute: 2.3 10*3/uL (ref 1.4–7.0)
Neutrophils: 50 %
Phosphorus: 2.9 mg/dL (ref 2.5–4.5)
Platelets: 290 10*3/uL (ref 150–379)
Potassium: 4 mmol/L (ref 3.5–5.2)
RBC: 3.97 x10E6/uL (ref 3.77–5.28)
RDW: 12.5 % (ref 12.3–15.4)
Sodium: 139 mmol/L (ref 134–144)
T3 Uptake Ratio: 26 % (ref 24–39)
T4, Total: 11.4 ug/dL (ref 4.5–12.0)
TSH: 1.5 u[IU]/mL (ref 0.450–4.500)
Total Protein: 6.7 g/dL (ref 6.0–8.5)
Triglycerides: 57 mg/dL (ref 0–149)
Uric Acid: 4.5 mg/dL (ref 2.5–7.1)
VLDL Cholesterol Cal: 11 mg/dL (ref 5–40)
WBC: 4.6 10*3/uL (ref 3.4–10.8)

## 2016-02-20 LAB — ANA W/REFLEX IF POSITIVE: Anti Nuclear Antibody(ANA): NEGATIVE

## 2016-02-20 LAB — SEDIMENTATION RATE: Sed Rate: 2 mm/hr (ref 0–32)

## 2016-02-20 LAB — B12 AND FOLATE PANEL
Folate: >20 ng/mL (ref >3.0)
Vitamin B-12: 904 pg/mL (ref 211–946)

## 2016-02-20 LAB — RHEUMATOID FACTOR: Rhuematoid fact SerPl-aCnc: <10 IU/mL (ref 0.0–13.9)

## 2016-04-06 ENCOUNTER — Encounter: Payer: Self-pay | Admitting: Physician Assistant

## 2016-04-06 ENCOUNTER — Other Ambulatory Visit: Payer: Self-pay | Admitting: Physician Assistant

## 2016-04-06 ENCOUNTER — Ambulatory Visit: Payer: Self-pay | Admitting: Physician Assistant

## 2016-04-06 VITALS — BP 110/79 | HR 82 | Temp 97.9°F

## 2016-04-06 DIAGNOSIS — J069 Acute upper respiratory infection, unspecified: Secondary | ICD-10-CM

## 2016-04-06 MED ORDER — DOXYCYCLINE HYCLATE 100 MG PO TABS: 100.0000 mg | ORAL_TABLET | Freq: Two times a day (BID) | ORAL | Status: DC

## 2016-04-06 MED ORDER — FLUCONAZOLE 150 MG PO TABS: ORAL_TABLET | ORAL | Status: DC

## 2016-04-06 NOTE — Progress Notes (Signed)
S: c/o hoarse voice and bronchial irritation, cough is dry and harsh, friend has same sx and was eating from same salsa bowl, no fever/chills body aches, did have some night sweats last 2 nights and has been really tired  O: vitals wnl, nad, tms clear, nasal mucosa pink , throat wnl, neck supple no lymph, voice is hoarse, cough is dry, lungs c t a, cv rrr  A: acute uri  P: doxy 100mg  bid, diflucan

## 2016-04-29 ENCOUNTER — Other Ambulatory Visit: Payer: Self-pay | Admitting: Physician Assistant

## 2016-05-02 NOTE — Telephone Encounter (Signed)
Med refill approved, pt uses medication for insomnia

## 2016-05-25 ENCOUNTER — Encounter: Payer: Self-pay | Admitting: Physician Assistant

## 2016-05-25 ENCOUNTER — Ambulatory Visit: Payer: Self-pay | Admitting: Physician Assistant

## 2016-05-25 VITALS — BP 120/70 | HR 81 | Temp 97.6°F

## 2016-05-25 DIAGNOSIS — J4520 Mild intermittent asthma, uncomplicated: Secondary | ICD-10-CM

## 2016-05-25 MED ORDER — MONTELUKAST SODIUM 10 MG PO TABS: 10.0000 mg | ORAL_TABLET | Freq: Every day | ORAL | Status: DC

## 2016-05-25 MED ORDER — FLUTICASONE-SALMETEROL 115-21 MCG/ACT IN AERO: 2.0000 | INHALATION_SPRAY | Freq: Two times a day (BID) | RESPIRATORY_TRACT | Status: DC

## 2016-05-25 MED ORDER — ALBUTEROL SULFATE HFA 108 (90 BASE) MCG/ACT IN AERS: 2.0000 | INHALATION_SPRAY | Freq: Four times a day (QID) | RESPIRATORY_TRACT | Status: DC | PRN

## 2016-05-25 NOTE — Progress Notes (Signed)
S: c/o increased asthma type problems, more wheezing and chest tightness while working out, starts coughing when she laughs, having to use inhaler more, ? If she should be on a longer acting inhaler  O: vitals wnl, nad, ent wnl, neck supple no lymph, lungs c t a, cv rrr, when pt laughs she starts coughing  A: asthma symptoms  P: advair hfa 115/21 bid, singulair 10mg  , albuterol inhaler, f/u with pulmonology

## 2016-05-26 ENCOUNTER — Ambulatory Visit: Payer: Self-pay | Admitting: Physician Assistant

## 2016-06-07 NOTE — Progress Notes (Signed)
Patient ID: [name], female   DOB: [birth year], 40 y.o.   MRN: [id number] Patient has been referred to Dr. Stevenson Clinch at [facility].  Appt is 7/12 @ 9:30.  Patient has been notified and has accepted the appointment.

## 2016-06-07 NOTE — Addendum Note (Signed)
Addended by: Rudene Anda T on: 06/07/2016 10:17 AM   Modules accepted: Orders

## 2016-06-09 ENCOUNTER — Encounter: Payer: Self-pay | Admitting: Physician Assistant

## 2016-06-09 ENCOUNTER — Ambulatory Visit: Payer: Self-pay | Admitting: Physician Assistant

## 2016-06-09 VITALS — BP 120/70 | HR 75 | Temp 98.2°F

## 2016-06-09 DIAGNOSIS — J069 Acute upper respiratory infection, unspecified: Secondary | ICD-10-CM

## 2016-06-09 DIAGNOSIS — J0181 Other acute recurrent sinusitis: Secondary | ICD-10-CM

## 2016-06-09 MED ORDER — LEVOFLOXACIN 500 MG PO TABS: 500.0000 mg | ORAL_TABLET | Freq: Every day | ORAL | Status: DC

## 2016-06-09 MED ORDER — FLUCONAZOLE 150 MG PO TABS: ORAL_TABLET | ORAL | Status: DC

## 2016-06-09 NOTE — Progress Notes (Signed)
S: C/o runny nose and congestion for 5  days, no fever, chills, cp/sob, v/d; mucus is green and thick, cough is sporadic, c/o of facial and dental pain. statesis having a lot of drainage down her throat and its making her voice hoarse  Using otc meds:   O: PE: vitals wnl, nad, perrl eomi, normocephalic, tms dull, nasal mucosa red and swollen, throat injected, neck supple no lymph, lungs c t a, cv rrr, neuro intact  A:  Acute sinusitis   P: levaquin 500mg , diflucan, saline nasal rinse bid, drink fluids, continue regular meds , use otc meds of choice, return if not improving in 5 days, return earlier if worsening

## 2016-06-22 ENCOUNTER — Institutional Professional Consult (permissible substitution): Payer: Self-pay | Admitting: Internal Medicine

## 2016-06-22 ENCOUNTER — Encounter: Payer: Self-pay | Admitting: *Deleted

## 2016-07-08 ENCOUNTER — Ambulatory Visit: Payer: Self-pay | Admitting: Physician Assistant

## 2016-07-08 ENCOUNTER — Encounter: Payer: Self-pay | Admitting: Physician Assistant

## 2016-07-08 DIAGNOSIS — J0181 Other acute recurrent sinusitis: Secondary | ICD-10-CM

## 2016-07-08 MED ORDER — FLUCONAZOLE 150 MG PO TABS: ORAL_TABLET | ORAL | 0 refills | Status: DC

## 2016-07-08 MED ORDER — CEFDINIR 300 MG PO CAPS: 300.0000 mg | ORAL_CAPSULE | Freq: Two times a day (BID) | ORAL | 0 refills | Status: DC

## 2016-07-08 NOTE — Progress Notes (Signed)
S: C/o continued sinus pain and congestion, sx have been ongoing for about a month, had septum fixed few months ago and its not helping, no fever, chills, cp/sob, v/d; mucus is green and thick, c/o of facial  Pain at forehead mainly  Also could have rx for xanax for air flight, has never flown before and is really nervous   Using otc meds:   O: PE: perrl eomi, normocephalic, tms dull, nasal mucosa red and swollen, throat injected, neck supple no lymph, lungs c t a, cv rrr, neuro intact  A:  Acute sinusitis   P: omnicef 300mg  diflucan, xanax 0.5mg  #4 nr; drink fluids, continue regular meds , use otc meds of choice, return if not improving in 5 days, return earlier if worsening , explained to patient that it is imperative that she see ENT, has been on several antibiotics since  Her surgery and feel that she should see them at this time

## 2016-07-13 NOTE — Addendum Note (Signed)
Addended by: Rudene Anda T on: 07/13/2016 03:10 PM   Modules accepted: Orders

## 2016-07-22 ENCOUNTER — Telehealth: Payer: Self-pay | Admitting: Emergency Medicine

## 2016-07-22 NOTE — Telephone Encounter (Signed)
I called the patient to follow-up on the referral that was sent to [facility] clinic on her behave.  Patient expressed that the clinic has called her to set up an appointment. She wasn't able to set up the appointment at the time but will call them back and schedule it.

## 2016-08-19 ENCOUNTER — Encounter: Payer: Self-pay | Admitting: Physician Assistant

## 2016-08-19 ENCOUNTER — Ambulatory Visit: Payer: Self-pay | Admitting: Physician Assistant

## 2016-08-19 VITALS — BP 119/64 | HR 79 | Temp 97.8°F

## 2016-08-19 DIAGNOSIS — M25511 Pain in right shoulder: Secondary | ICD-10-CM

## 2016-08-19 DIAGNOSIS — N926 Irregular menstruation, unspecified: Secondary | ICD-10-CM

## 2016-08-19 DIAGNOSIS — L689 Hypertrichosis, unspecified: Secondary | ICD-10-CM

## 2016-08-19 DIAGNOSIS — R635 Abnormal weight gain: Secondary | ICD-10-CM

## 2016-08-19 MED ORDER — DICLOFENAC SODIUM 1 % TD GEL: 2.0000 g | Freq: Four times a day (QID) | TRANSDERMAL | 6 refills | Status: DC

## 2016-08-19 MED ORDER — HYDROCODONE-ACETAMINOPHEN 5-325 MG PO TABS: 1.0000 | ORAL_TABLET | Freq: Four times a day (QID) | ORAL | 0 refills | Status: DC | PRN

## 2016-08-19 MED ORDER — MELOXICAM 15 MG PO TABS: 15.0000 mg | ORAL_TABLET | Freq: Every day | ORAL | 6 refills | Status: DC

## 2016-08-19 NOTE — Progress Notes (Signed)
S: c/o r clavicular pain, old injury where it was dislocated, having a lot of pain and swelling, feels like its out of place, hurts to do her hair, put on shirt, reach to side etc, also ? What to do , ?endocrinology or other doctor to check her hormones, states she has weight gain, facial hair, irregular periods, states her tsh was borderline last time  O: vitals wnl, nad, r clavicle is swollen and medial aspect is protruding, pt becomes tearful with movement of shoulder, neck supple , lungs c t a, cv rrr  A: clavicular pain, weight gain and facial hair  P: mobic, voltaren gel, vicodin 5/325 #20 nr, pt to f/u with [facility] to have hormones checked

## 2016-09-15 ENCOUNTER — Encounter: Payer: Self-pay | Admitting: Physician Assistant

## 2016-09-15 ENCOUNTER — Ambulatory Visit: Payer: Self-pay | Admitting: Physician Assistant

## 2016-09-15 VITALS — BP 136/75 | HR 80 | Temp 98.3°F

## 2016-09-15 DIAGNOSIS — R531 Weakness: Secondary | ICD-10-CM

## 2016-09-15 LAB — POCT INFLUENZA A/B
Influenza A, POC: NEGATIVE
Influenza B, POC: NEGATIVE

## 2016-09-15 NOTE — Progress Notes (Signed)
   Subjective: Flu symptoms    Patient ID: [name], female    DOB: 1975/12/13, 40 y.o.   MRN: [id number]  HPI Patient c/o 2 days of nasal congestion, ear pressure, sore throat, cough, and Body ache. States intermitting fever//chills. Denies vomiting or diarrhea. Increasing myalgia and arthralgia. Exposed to Flu via husband. 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   Review of Systems Hyperthyroidism, Arthritis and Asthma.    Objective:   Physical Exam HEENT for bilateral maxillary guarding, edematous nasal turbinates, clear rhinorrhea, and post nasal drainage. Neck supple without adenopathy. Lungs CTA with non-productive cough. Heart RRR.  Rapid Flu negative.       Assessment & Plan:Viral illness  Tamiflu, Allergra-D, Tessalon, and Naproxen. Follow up with PCP.

## 2016-11-21 ENCOUNTER — Encounter: Payer: Self-pay | Admitting: Physician Assistant

## 2016-11-21 ENCOUNTER — Ambulatory Visit: Payer: Self-pay | Admitting: Physician Assistant

## 2016-11-21 VITALS — BP 120/70 | HR 79 | Temp 97.8°F

## 2016-11-21 DIAGNOSIS — J0120 Acute ethmoidal sinusitis, unspecified: Secondary | ICD-10-CM

## 2016-11-21 MED ORDER — METHYLPREDNISOLONE 4 MG PO TBPK: ORAL_TABLET | ORAL | 0 refills | Status: DC

## 2016-11-21 MED ORDER — LEVOFLOXACIN 500 MG PO TABS: 500.0000 mg | ORAL_TABLET | Freq: Every day | ORAL | 0 refills | Status: DC

## 2016-11-21 MED ORDER — FLUCONAZOLE 150 MG PO TABS: ORAL_TABLET | ORAL | 0 refills | Status: DC

## 2016-11-21 NOTE — Progress Notes (Signed)
S: C/o runny nose and congestion with a lot of sinus pain /pressure for 5-6 days, no fever, chills, cp/sob, v/d; mucus is green and thick, cough is sporadic, c/o of facial and dental pain. More pain in t zone  Using otc meds:   O: PE: vitals wnl, nad, perrl eomi, normocephalic, tms dull, nasal mucosa red and swollen, throat injected, neck supple no lymph, lungs c t a, cv rrr, neuro intact  A:  Acute sinusitis   P: drink fluids, continue regular meds , use otc meds of choice, return if not improving in 5 days, return earlier if worsening, levaquin, medrol dose, diflucan

## 2016-12-09 ENCOUNTER — Ambulatory Visit (INDEPENDENT_AMBULATORY_CARE_PROVIDER_SITE_OTHER): Payer: Managed Care, Other (non HMO) | Admitting: Certified Nurse Midwife

## 2016-12-09 ENCOUNTER — Encounter: Payer: Self-pay | Admitting: Certified Nurse Midwife

## 2016-12-09 VITALS — BP 110/77 | HR 87 | Ht 67.0 in | Wt 159.4 lb

## 2016-12-09 DIAGNOSIS — M069 Rheumatoid arthritis, unspecified: Secondary | ICD-10-CM

## 2016-12-09 DIAGNOSIS — Z01419 Encounter for gynecological examination (general) (routine) without abnormal findings: Secondary | ICD-10-CM | POA: Diagnosis not present

## 2016-12-09 DIAGNOSIS — M2550 Pain in unspecified joint: Secondary | ICD-10-CM | POA: Insufficient documentation

## 2016-12-09 HISTORY — DX: Rheumatoid arthritis, unspecified: M06.9

## 2016-12-09 MED ORDER — NORETHINDRONE ACET-ETHINYL EST 1-20 MG-MCG PO TABS: 1.0000 | ORAL_TABLET | Freq: Every day | ORAL | 11 refills | Status: DC

## 2016-12-09 NOTE — Progress Notes (Signed)
Pt is here for a regular checkup. States she has a PCP that does her labs. Was recently dx with hypothyroidism. Meds are still being adjusted. Her last pap smear was 10/2015. Last mammogram was 12/2015 and both had neg results.

## 2016-12-09 NOTE — Progress Notes (Signed)
ANNUAL PREVENTATIVE CARE GYN  ENCOUNTER NOTE  Subjective:       [name] is a 40 y.o. G64P1101 female here for a routine annual gynecologic exam.    Pt was previously seen at [facility] in [place].   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 Lives with her 5 y.o. Son on and 61 y.o. Daughter. She has been a single parent since they were [age] and [age] years of age.   She has been in a relationship with the same boyfriend for the last 2 (two) years. He resides in [place], so she travels frequently to see him.    [name] was recently diagnosed with hypothyroidism, which is being managed by her PCP. She is starting to feel better and is in the process of "getting back to her normal self'.   Gynecologic History Patient's last menstrual period was 12/07/2016 (exact date). Contraception: OCP (estrogen/progesterone) Last Pap: 10/2015. Results were: normal Last mammogram: 12/2015. Results were: normal  Obstetric History OB History  Gravida Para Term Preterm AB Living  3 2 1 1   1   SAB TAB Ectopic Multiple Live Births          1    # Outcome Date GA Lbr Len/2nd Weight Sex Delivery Anes PTL Lv  3 Preterm 2004   6 lb 5 oz (2.863 kg) F CS-Unspec  Y LIV     Complications: Placenta Previa  2 Term 2002   7 lb 9 oz (3.43 kg) M Vag-Spont  N      Complications: Preeclampsia, severe, third trimester  1 Gravida               Past Medical History:  Diagnosis Date  . Bone spur    "front of spine" - effects right shoulder (sometimes)  . Deviated nasal septum   . RA (rheumatoid arthritis) ([place]) 2016  . Sinusitis   . Thyroid disease     Past Surgical History:  Procedure Laterality Date  . CESAREAN SECTION    . DILATION AND CURETTAGE OF UTERUS     (x2)  . FRONTAL SINUS EXPLORATION Bilateral 11/19/2015   Procedure: FRONTAL SINUS EXPLORATION;  Surgeon: Margaretha Sheffield, MD;  Location: [place];  Service: ENT;  Laterality: Bilateral;  . IMAGE GUIDED SINUS SURGERY N/A 11/19/2015    Procedure: IMAGE GUIDED SINUS SURGERY;  Surgeon: Margaretha Sheffield, MD;  Location: [place];  Service: ENT;  Laterality: N/A;  GAVE DISK TO CE CE 09/22  . MAXILLARY ANTROSTOMY Bilateral 11/19/2015   Procedure: MAXILLARY ANTROSTOMY;  Surgeon: Margaretha Sheffield, MD;  Location: [place];  Service: ENT;  Laterality: Bilateral;  . SEPTOPLASTY N/A 11/19/2015   Procedure: SEPTOPLASTY;  Surgeon: Margaretha Sheffield, MD;  Location: [place];  Service: ENT;  Laterality: N/A;  . SPHENOIDECTOMY Bilateral 11/19/2015   Procedure: [name];  Surgeon: Margaretha Sheffield, MD;  Location: [place];  Service: ENT;  Laterality: Bilateral;  . TONSILLECTOMY      Current Outpatient Prescriptions on File Prior to Visit  Medication Sig Dispense Refill  . albuterol (PROVENTIL HFA;VENTOLIN HFA) 108 (90 Base) MCG/ACT inhaler Inhale 2 puffs into the lungs every 6 (six) hours as needed for wheezing or shortness of breath. 1 Inhaler 12  . diclofenac sodium (VOLTAREN) 1 % GEL Apply 2 g topically 4 (four) times daily. 100 g 6  . fluconazole (DIFLUCAN) 150 MG tablet Take one now and one in a week 2 tablet 0  . ipratropium-albuterol (DUONEB) 0.5-2.5 (3)  MG/3ML SOLN Take 3 mLs by nebulization every 4 (four) hours as needed. 360 mL 3  . liothyronine (CYTOMEL) 5 MCG tablet Take 20 mcg by mouth daily.     Marland Kitchen MAGNESIUM PO Take by mouth.    . Multiple Vitamin (MULTIVITAMIN) tablet Take 1 tablet by mouth daily.    . Omega-3 Fatty Acids (FISH OIL PO) Take by mouth.    Marland Kitchen POTASSIUM PO Take by mouth.    . traZODone (DESYREL) 150 MG tablet TAKE 1/2 TO 1 TABLET AT BEDTIME 30 tablet 6   No current facility-administered medications on file prior to visit.     Allergies  Allergen Reactions  . Penicillins Swelling    Pt states caused throat to swell   . Cefdinir Palpitations  . Sulfa Antibiotics Other (See Comments)    Lips tingled    Social History   Social History  . Marital status: Divorced    Spouse  name: N/A  . Number of children: N/A  . Years of education: N/A   Occupational History  . Not on file.   Social History Main Topics  . Smoking status: Never Smoker  . Smokeless tobacco: Never Used  . Alcohol use 0.0 oz/week     Comment: rare  . Drug use: No  . Sexual activity: Yes    Birth control/ protection: Pill   Other Topics Concern  . Not on file   Social History Narrative  . No narrative on file    Family History  Problem Relation Age of Onset  . Rheum arthritis Mother     The following portions of the patient's history were reviewed and updated as appropriate: allergies, current medications, past family history, past medical history, past social history, past surgical history and problem list.  Review of Systems ROS Review of Systems -   General ROS: negative for - chills, fatigue, fever, hot flashes, night sweats, weight gain or weight loss  Psychological ROS: negative for - anxiety, decreased libido, depression, mood swings, physical abuse or sexual abuse  ENT ROS: negative for - headaches, hearing change, visual changes or vocal changes  Endocrine ROS: negative for - galactorrhea, hair pattern changes, hot flashes, malaise/lethargy, mood swings, palpitations, polydipsia/polyuria, skin changes, temperature intolerance or unexpected weight changes  Breast ROS: negative for - new or changing breast lumps or nipple discharge  Respiratory ROS: negative for - cough or shortness of breath  Cardiovascular ROS: negative for - chest pain, irregular heartbeat, palpitations or shortness of breath  Gastrointestinal ROS: no abdominal pain, change in bowel habits, or black or bloody stools  Genito-Urinary ROS: no dysuria, trouble voiding, or hematuria   Dermatological ROS: negative for rash and skin lesion changes   Objective:   BP 110/77   Pulse 87   Ht 5\' 7"  (1.702 m)   Wt 159 lb 6 oz (72.3 kg)   LMP 12/07/2016 (Exact Date)   BMI 24.96 kg/m   CONSTITUTIONAL: Well groomed female in no acute distress.   PSYCHIATRIC: Normal mood and affect. Normal behavior.   [place]: Alert and oriented to person, place, and time. Normal muscle tone coordination.  HENT:  Normocephalic, atraumatic   NECK: Normal range of motion, supple, no masses.  Normal thyroid.   SKIN: Skin is warm and dry. No rash noted. Not diaphoretic. No erythema. No pallor. Seven professional tattoos.   CARDIOVASCULAR: Normal heart rate noted, regular rhythm, no murmur.  RESPIRATORY: Clear to auscultation bilaterally.   BREASTS: Symmetric in size. No masses, skin changes,  nipple drainage, or lymphadenopathy.  ABDOMEN: Soft, normal bowel sounds, no distention noted.  No tenderness, rebound or guarding.   PELVIC:  External Genitalia: Normal  Vagina: Normal  Cervix: Normal  Uterus: Normal  Adnexa: Normal  RV: Not examined   MUSCULOSKELETAL: Normal range of motion. No tenderness.     Assessment:   Annual gynecologic examination 40 y.o. Contraception: OCP (estrogen/progesterone) Normal BMI Problem List Items Addressed This Visit    None      Plan:  Pap: Not needed  Mammogram: 1-2 years   Birth control refill: Microgestin 1/20 per orders  Routine preventative health maintenance measures emphasized: skin exams and use of birth control  Return to [facility] or sooner if needed.   Diona Fanti, CNM

## 2016-12-09 NOTE — Patient Instructions (Signed)
Mammogram A mammogram is an X-ray of the breasts that is done to check for abnormal changes. This procedure can screen for and detect any changes that may suggest breast cancer. A mammogram can also identify other changes and variations in the breast, such as:  Inflammation of the breast tissue (mastitis).  An infected area that contains a collection of pus (abscess).  A fluid-filled sac (cyst).  Fibrocystic changes. This is when breast tissue becomes denser, which can make the tissue feel rope-like or uneven under the skin.  Tumors that are not cancerous (benign).  Tell a health care provider about:  Any allergies you have.  If you have breast implants.  If you have had previous breast disease, biopsy, or surgery.  If you are breastfeeding.  Any possibility that you could be pregnant, if this applies.  If you are younger than age 25.  If you have a family history of breast cancer. What are the risks? Generally, this is a safe procedure. However, problems may occur, including:  Exposure to radiation. Radiation levels are very low with this test.  The results being misinterpreted.  The need for further tests.  The inability of the mammogram to detect certain cancers.  What happens before the procedure?  Schedule your test about 1-2 weeks after your menstrual period. This is usually when your breasts are the least tender.  If you have had a mammogram done at a different facility in the past, get the mammogram X-rays or have them sent to your current exam facility in order to compare them.  Wash your breasts and under your arms the day of the test.  Do not wear deodorants, perfumes, lotions, or powders anywhere on your body on the day of the test.  Remove any jewelry from your neck.  Wear clothes that you can change into and out of easily. What happens during the procedure?  You will undress from the waist up and put on a gown.  You will stand in front of the  X-ray machine.  Each breast will be placed between two plastic or glass plates. The plates will compress your breast for a few seconds. Try to stay as relaxed as possible during the procedure. This does not cause any harm to your breasts and any discomfort you feel will be very brief.  X-rays will be taken from different angles of each breast. The procedure may vary among health care providers and hospitals. What happens after the procedure?  The mammogram will be examined by a specialist (radiologist).  You may need to repeat certain parts of the test, depending on the quality of the images. This is commonly done if the radiologist needs a better view of the breast tissue.  Ask when your test results will be ready. Make sure you get your test results.  You may resume your normal activities. This information is not intended to replace advice given to you by your health care provider. Make sure you discuss any questions you have with your health care provider. Document Released: 11/25/2000 Document Revised: 05/02/2016 Document Reviewed: 02/06/2015 Elsevier Interactive Patient Education  2017 Elsevier Inc.  

## 2017-01-02 ENCOUNTER — Ambulatory Visit: Payer: Self-pay | Admitting: Family

## 2017-01-02 VITALS — BP 110/70 | HR 89 | Temp 98.2°F

## 2017-01-02 DIAGNOSIS — J069 Acute upper respiratory infection, unspecified: Secondary | ICD-10-CM

## 2017-01-03 ENCOUNTER — Encounter: Payer: Self-pay | Admitting: Family

## 2017-01-03 NOTE — Progress Notes (Signed)
S/  2 d hx of  persistant dry cough, hoarseness, scratchy throat , generalised malaise , little nasal sxs  Has had to use her inhaler  Regularly but no more than directed.   O VSS. NAD  ENT unremarkable,  Neck supple , heart rsr lungs are clear A/ viral resp  Illness P we discussed  pred pulse - declined due to side effects .  .  viral course discussed and supportive measures encouraged. F/u prn not improving per viral timeline.

## 2017-01-05 ENCOUNTER — Encounter: Payer: Self-pay | Admitting: Certified Nurse Midwife

## 2017-02-07 ENCOUNTER — Ambulatory Visit: Payer: Self-pay | Admitting: Physician Assistant

## 2017-02-07 ENCOUNTER — Encounter: Payer: Self-pay | Admitting: Physician Assistant

## 2017-02-07 VITALS — BP 110/80 | HR 91 | Temp 98.7°F

## 2017-02-07 DIAGNOSIS — Z7689 Persons encountering health services in other specified circumstances: Secondary | ICD-10-CM

## 2017-02-07 NOTE — Progress Notes (Signed)
S: pt has been going to [facility] in [place], been treated for abnormal reverse t3; states they said the 56mcg of her medication was the highest they felt comfortable doing, states she still doesn't feel well, would like to be referred to someone at [facility], her RA doctor is at [facility] and her orthopedic doctor is there also, pt has copy of her labs showing abnormal high levels of reverse t3; states she is still tired and still has bags under her eyes  O: vitals wnl nad, skin with "shiners" under both eyes, n/v intact  A: endocrinology referral  P: refer to endocrinology at [facility]

## 2017-02-13 ENCOUNTER — Encounter: Payer: Self-pay | Admitting: Physician Assistant

## 2017-02-13 ENCOUNTER — Ambulatory Visit: Payer: Self-pay | Admitting: Physician Assistant

## 2017-02-13 VITALS — BP 100/65 | HR 82 | Temp 98.5°F

## 2017-02-13 DIAGNOSIS — J209 Acute bronchitis, unspecified: Secondary | ICD-10-CM

## 2017-02-13 NOTE — Progress Notes (Signed)
Per Randel Pigg, PA authorization I called in a Diflucan 150mg  in to [place].

## 2017-02-13 NOTE — Progress Notes (Signed)
   Subjective:cough    Patient ID: [name], female    DOB: 1976/12/09, 41 y.o.   MRN: [id number]  HPI Patient states 4 days of non-productive cough/wheezing, fever, and fatigue. Decrease voice volume. No palliataive measures for compliant.   Review of Systems Negative except for compliant.    Objective:   Physical Exam HEENT unremarkable. Neck supple w/o adenopathy. Lungs with bilateral Rales. Heart RRR.       Assessment & Plan:Bronchitis  Cedinir, Tessalon, Medrol Dose pack, and Tussion to take at night. Work excuse given for 2 days. Follow up PRN.

## 2017-02-24 NOTE — Progress Notes (Signed)
Patient called and expressed that she has developed a yeast infection since Randel Pigg prescribed her an antibiotic.  Per Susan's authorization I called in a Diflucan 150 in to [place].

## 2017-03-16 ENCOUNTER — Ambulatory Visit: Payer: Self-pay | Admitting: Physician Assistant

## 2017-03-16 ENCOUNTER — Encounter: Payer: Self-pay | Admitting: Physician Assistant

## 2017-03-16 VITALS — BP 124/60 | HR 85 | Temp 98.7°F

## 2017-03-16 DIAGNOSIS — L03019 Cellulitis of unspecified finger: Secondary | ICD-10-CM

## 2017-03-16 MED ORDER — MUPIROCIN 2 % EX OINT: 1.0000 "application " | TOPICAL_OINTMENT | Freq: Two times a day (BID) | CUTANEOUS | 0 refills | Status: DC

## 2017-03-16 NOTE — Progress Notes (Signed)
S: c/o swelling at left middle finger along cuticle, been soaking it in warm water and epsom salt, now area is a little itchy, no drainage, no fever/chillls  O: vitals wnl, nad, skin with slight red area at cuticle, no pus noted, area is not fluctuant, full rom of finger, n/v intact  A: paronychia  P: bactroban ointment, if worsening rx for doxy bid

## 2017-03-30 DIAGNOSIS — E039 Hypothyroidism, unspecified: Secondary | ICD-10-CM | POA: Insufficient documentation

## 2017-04-10 ENCOUNTER — Ambulatory Visit: Payer: Managed Care, Other (non HMO) | Attending: Orthopedic Surgery

## 2017-04-10 ENCOUNTER — Ambulatory Visit: Payer: Self-pay | Admitting: Physician Assistant

## 2017-04-10 ENCOUNTER — Encounter: Payer: Self-pay | Admitting: Physician Assistant

## 2017-04-10 VITALS — BP 120/70 | HR 87 | Temp 98.2°F

## 2017-04-10 DIAGNOSIS — R5383 Other fatigue: Secondary | ICD-10-CM

## 2017-04-10 DIAGNOSIS — M25511 Pain in right shoulder: Secondary | ICD-10-CM | POA: Insufficient documentation

## 2017-04-10 DIAGNOSIS — M25611 Stiffness of right shoulder, not elsewhere classified: Secondary | ICD-10-CM | POA: Diagnosis present

## 2017-04-10 DIAGNOSIS — G8929 Other chronic pain: Secondary | ICD-10-CM | POA: Insufficient documentation

## 2017-04-10 NOTE — Progress Notes (Signed)
S: pt states she was seen at [facility] for rheumatology, was told she might have pernicious anemia and should be checked by her pcp, also states they took 17 vials of blood just 2 weeks ago, did about 37 tests, also gets a fluttering in her chest, only last for a second or 2 and corrects itself, no sob, cp  O: vitals wnl, nad, lungs c t a, cv rrr  A: fatigue, abnormal lab values  P: return in 2 weeks to recheck cbc, b12, thyroid panel

## 2017-04-10 NOTE — Therapy (Signed)
[facility] [address], [place], [address] Phone: [phone]   Fax:  [phone]  Physical Therapy Treatment  Patient Details  Name: [name] MRN: [id number] Date of Birth: 07-13-1976 Referring Provider: Stark Jock MD  Encounter Date: 04/10/2017      PT End of Session - 04/10/17 1638    Visit Number 1   Number of Visits 12   Date for PT Re-Evaluation 05/22/17   PT Start Time 1525   PT Stop Time 1620   PT Time Calculation (min) 55 min   Activity Tolerance Patient tolerated treatment well   Behavior During Therapy [facility] for tasks assessed/performed      Past Medical History:  Diagnosis Date  . Bone spur    "front of spine" - effects right shoulder (sometimes)  . Deviated nasal septum   . RA (rheumatoid arthritis) ([place]) 2016  . Rheumatoid arthritis ([place]) 12/09/2016  . Sinusitis   . Thyroid disease     Past Surgical History:  Procedure Laterality Date  . CESAREAN SECTION    . DILATION AND CURETTAGE OF UTERUS     (x2)  . FRONTAL SINUS EXPLORATION Bilateral 11/19/2015   Procedure: FRONTAL SINUS EXPLORATION;  Surgeon: Margaretha Sheffield, MD;  Location: [place];  Service: ENT;  Laterality: Bilateral;  . IMAGE GUIDED SINUS SURGERY N/A 11/19/2015   Procedure: IMAGE GUIDED SINUS SURGERY;  Surgeon: Margaretha Sheffield, MD;  Location: [place];  Service: ENT;  Laterality: N/A;  GAVE DISK TO CE CE 09/22  . MAXILLARY ANTROSTOMY Bilateral 11/19/2015   Procedure: MAXILLARY ANTROSTOMY;  Surgeon: Margaretha Sheffield, MD;  Location: [place];  Service: ENT;  Laterality: Bilateral;  . SEPTOPLASTY N/A 11/19/2015   Procedure: SEPTOPLASTY;  Surgeon: Margaretha Sheffield, MD;  Location: [place];  Service: ENT;  Laterality: N/A;  . SPHENOIDECTOMY Bilateral 11/19/2015   Procedure: Coralee Pesa;  Surgeon: Margaretha Sheffield, MD;  Location: [place];  Service: ENT;  Laterality: Bilateral;   . TONSILLECTOMY      There were no vitals filed for this visit.      Subjective Assessment - 04/10/17 1530    Subjective Patient presents with increased R shoulder pain with reaching across body, reaching behind the back, lat pulldown, pull ups, sleeping, reaching over head. Patient reports she has increased neck pain on the R side patient reports C6-7 disc degeneration and radicular pain. Patient reports increased pain in the mid back. Patient reports alleviation of pain with stretching and cortizone shots. Patient reports the worst pain is a 7/10 and the best it's been is a 3/10.    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 Yes   Pain Score 3    Pain Location Shoulder   Pain Orientation Right   Pain Descriptors / Indicators Aching;Nagging   Pain Type Chronic pain   Pain Onset More than a month ago   Pain Frequency Constant   Multiple Pain Sites No            [facility] PT Assessment - 04/10/17 1531      Assessment   Medical Diagnosis R shoulder and scapular    Referring Provider Stark Jock MD   Onset Date/Surgical Date 08/12/16  Hand Dominance Right   Next MD Visit unknown   Prior Therapy none      Balance Screen   Has the patient fallen in the past 6 months No   Has the patient had a decrease in activity level because of a fear of falling?  No   Is the patient reluctant to leave their home because of a fear of falling?  No     Prior Function   Level of Independence Independent   Vocation Full time employment   Vocation Requirements Pushing, pulling, lift, wearing heavy vest    Leisure lifting, workout, travel, eat out      Cognition   Overall Cognitive Status Within Functional Limits for tasks assessed     Observation/Other Assessments   Other Surveys   Other Surveys   Neck Disability Index  36   Quick DASH  38     Sensation   Additional Comments Decreased light touch around C4 dermatome on the R side     ROM / Strength   AROM / PROM / Strength AROM;Strength     AROM   AROM Assessment Site Shoulder;Cervical;Thoracic   Right/Left Shoulder Right  Left Shoulder WNL for all movements   Right Shoulder Flexion 180 Degrees  Pain at end range   Right Shoulder ABduction 160 Degrees  Pain at 95, and 135deg   Right Shoulder Internal Rotation --  Behind the back: to T8; on L: T4   Right Shoulder External Rotation --  Behind the head: to T4; L: T8   Cervical Flexion 35   Cervical Extension 70   Cervical - Right Side Bend 35   Cervical - Left Side Bend 25   Cervical - Right Rotation 65   Cervical - Left Rotation 70   Thoracic Flexion WNL   Thoracic Extension 50% limited   Thoracic - Right Side Bend WNL   Thoracic - Left Side Bend WNL   Thoracic - Right Rotation 10% decreased to the Right   Thoracic - Left Rotation 10% decreased to the left     Strength   Strength Assessment Site Shoulder;Elbow;Cervical;Thoracic   Right/Left Shoulder Right  L: WNL   Right Shoulder Flexion 5/5  pain   Right Shoulder Extension 5/5   Right Shoulder ABduction 4/5  pain   Right Shoulder Internal Rotation 4+/5  pain   Right Shoulder External Rotation 5/5   Right/Left Elbow Right;Left   Right Elbow Flexion 5/5   Right Elbow Extension 5/5   Left Elbow Flexion 5/5   Left Elbow Extension 5/5   Cervical Flexion 4/5  pain   Cervical Extension 5/5  pain   Cervical - Right Side Bend 5/5   Cervical - Left Side Bend 5/5  pain   Thoracic Flexion 5/5   Thoracic Extension 5/5     Palpation   Palpation comment TTP: R UT, infraspinatus, pec major, subscap, rhomboids, mid traps, teres minor, major, lat      Special Tests    Special Tests Rotator Cuff Impingement  Repeated cerv retraction: x10 decreased pain   Rotator Cuff Impingment tests other      other   Scapular Assist Test Positive on R       Observation: Hypomobility on R shoulder inferior and A->P glides, Decreased motion in all assecory clavicular motions  TREATMENT:  Straight arm push downs at [employer] -- x20 with 20#s Push up PLUS -- x20          PT Education - 04/10/17  1639    Education provided Yes   Education Details HEP: Straight arm push downs   Person(s) Educated Patient   Methods Explanation;Demonstration   Comprehension Verbalized understanding;Returned demonstration             PT Long Term Goals - 04/10/17 1706      PT LONG TERM GOAL #1   Title Patient will improve QuickDASH to under 12% to indicate functional improvement with shoulder function and overhead movement   Baseline quickdash: 39%   Time 6   Period Weeks   Status New     PT LONG TERM GOAL #2   Title Patient will improve NDI to <10% to demonstrate significant improvement in neck function  and greater ability to sleep.    Baseline NDI: 36%   Time 6   Period Weeks   Status New     PT LONG TERM GOAL #3   Title Patient will be independent with HEP to demonstrate significant improvement in neck/shoulder function and continue benefits of therapy after D/C   Baseline Dependent with form/technique   Time 6   Period Weeks   Status New     PT LONG TERM GOAL #4   Title Patient will be able to perform a full UE workout without increase in pain to indicate improvement in shoulder function and ADLs   Baseline pain with UE exercises   Time 6   Period Weeks   Status New               Plan - 04/10/17 1640    Clinical Impression Statement Pt is 41 yo right hand dominant female experiencing increased R sided shoulder/scapular pain secondary to R clavicular distal dislocation. Patient demonstrates increased L shoulder dysfunction as indicated by increased scores on the QuickDASH and LEFS. Patient demonstrates increased muscular guarding throughout the periscapular, scapular and  trapezius musculature and patient will benefit from further skilled therapy focused on improving limitations to return to prior level of function.     Rehab Potential Good   Clinical Impairments Affecting Rehab Potential (-) Chronicity (+) Previous strength and highly motivated   PT Frequency 2x / week   PT Duration 6 weeks   PT Treatment/Interventions ADLs/Self Care Home Management;Cryotherapy;Electrical Stimulation;Ultrasound;Moist Heat;Iontophoresis 4mg /ml Dexamethasone;Gait training;Stair training;Therapeutic activities;Therapeutic exercise;Patient/family education;Neuromuscular re-education;Manual techniques;Dry needling;Passive range of motion;Scar mobilization   PT Next Visit Plan Mobility exercises, coordination and manual therapy to decrease pain   Consulted and Agree with Plan of Care Patient      Patient will benefit from skilled therapeutic intervention in order to improve the following deficits and impairments:  Pain, Increased fascial restricitons, Impaired sensation, Decreased mobility, Decreased coordination, Increased muscle spasms, Postural dysfunction, Decreased endurance, Decreased activity tolerance, Decreased range of motion, Decreased strength, Decreased balance  Visit Diagnosis: Chronic right shoulder pain  Stiffness of right shoulder, not elsewhere classified     Problem List Patient Active Problem List   Diagnosis Date Noted  . Rheumatoid arthritis ([place]) 12/09/2016    Blythe Stanford, PT DPT 04/10/2017, 5:10 PM  [facility] [address] [place], [address] Phone: [phone]   Fax:  [phone]  Name: [name] MRN: [id number] Date of Birth: 01-Mar-1976

## 2017-04-12 ENCOUNTER — Ambulatory Visit: Payer: Managed Care, Other (non HMO) | Attending: Orthopedic Surgery

## 2017-04-12 DIAGNOSIS — M25511 Pain in right shoulder: Secondary | ICD-10-CM | POA: Diagnosis present

## 2017-04-12 DIAGNOSIS — G8929 Other chronic pain: Secondary | ICD-10-CM | POA: Insufficient documentation

## 2017-04-12 DIAGNOSIS — M25611 Stiffness of right shoulder, not elsewhere classified: Secondary | ICD-10-CM | POA: Diagnosis present

## 2017-04-12 NOTE — Therapy (Signed)
[facility] [address] [place], [address] Phone: [phone]   Fax:  [phone]  Physical Therapy Treatment  Patient Details  Name: [name] MRN: [id number] Date of Birth: [birth year] Referring Provider: Stark Jock MD  Encounter Date: 04/12/2017      PT End of Session - 04/12/17 1613    Visit Number 2   Number of Visits 12   Date for PT Re-Evaluation 05/22/17   PT Start Time 1520   PT Stop Time 1600   PT Time Calculation (min) 40 min   Activity Tolerance Patient tolerated treatment well   Behavior During Therapy [facility] for tasks assessed/performed      Past Medical History:  Diagnosis Date  . Bone spur    "front of spine" - effects right shoulder (sometimes)  . Deviated nasal septum   . RA (rheumatoid arthritis) ([place]) 2016  . Rheumatoid arthritis ([place]) 12/09/2016  . Sinusitis   . Thyroid disease     Past Surgical History:  Procedure Laterality Date  . CESAREAN SECTION    . DILATION AND CURETTAGE OF UTERUS     (x2)  . FRONTAL SINUS EXPLORATION Bilateral 11/19/2015   Procedure: FRONTAL SINUS EXPLORATION;  Surgeon: Margaretha Sheffield, MD;  Location: [place];  Service: ENT;  Laterality: Bilateral;  . IMAGE GUIDED SINUS SURGERY N/A 11/19/2015   Procedure: IMAGE GUIDED SINUS SURGERY;  Surgeon: Margaretha Sheffield, MD;  Location: [place];  Service: ENT;  Laterality: N/A;  GAVE DISK TO CE CE 09/22  . MAXILLARY ANTROSTOMY Bilateral 11/19/2015   Procedure: MAXILLARY ANTROSTOMY;  Surgeon: Margaretha Sheffield, MD;  Location: [place];  Service: ENT;  Laterality: Bilateral;  . SEPTOPLASTY N/A 11/19/2015   Procedure: SEPTOPLASTY;  Surgeon: Margaretha Sheffield, MD;  Location: [place];  Service: ENT;  Laterality: N/A;  . SPHENOIDECTOMY Bilateral 11/19/2015   Procedure: Coralee Pesa;  Surgeon: Margaretha Sheffield, MD;  Location: [place];  Service: ENT;  Laterality: Bilateral;   . TONSILLECTOMY      There were no vitals filed for this visit.      Subjective Assessment - 04/12/17 1612    Subjective Patient reports she continues to have pain when lifting the arm up at her side and reaching behind her back.    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 Yes   Pain Score 3    Pain Location Shoulder   Pain Orientation Right   Pain Descriptors / Indicators Aching;Nagging   Pain Type Chronic pain   Pain Onset More than a month ago      TREATMENT: Therapeutic Exercise: Serratus punches in standing with GTB -- 2 x 20  Standing B Shoulder ER -- 2 x 20  Wall angels while facing the wall overhead -- 2 x 15  R arm overhead ball bounces for lower trap activation -- x40 4# ball Sitting upper trap stretch on the R side -- 15sec x 3   Manual Therapy: STM to patient UT and levator scapulae in sitting to decrease increased pain and spasms; Performed STM to latissimus dorsi, biceps tendon, delt and pec major to decrease pain and spasms in the shoulder. Mobilizations to R shoulder inferior/A->P with arm progressing ranged into  shoulder ER -- 3 x 30  Observation: Serratus muscle on the R -- 4/5; on the L -- 5/5           PT Education - 04/12/17 1613    Education provided Yes   Education Details Form/technique with exercise performance   Person(s) Educated Patient   Methods Explanation;Demonstration   Comprehension Verbalized understanding;Returned demonstration             PT Long Term Goals - 04/10/17 1706      PT LONG TERM GOAL #1   Title Patient will improve QuickDASH to under 12% to indicate functional improvement with shoulder function and overhead movement   Baseline quickdash: 39%   Time 6   Period Weeks   Status New     PT LONG TERM  GOAL #2   Title Patient will improve NDI to <10% to demonstrate significant improvement in neck function  and greater ability to sleep.    Baseline NDI: 36%   Time 6   Period Weeks   Status New     PT LONG TERM GOAL #3   Title Patient will be independent with HEP to demonstrate significant improvement in neck/shoulder function and continue benefits of therapy after D/C   Baseline Dependent with form/technique   Time 6   Period Weeks   Status New     PT LONG TERM GOAL #4   Title Patient will be able to perform a full UE workout without increase in pain to indicate improvement in shoulder function and ADLs   Baseline pain with UE exercises   Time 6   Period Weeks   Status New               Plan - 04/12/17 1614    Clinical Impression Statement Patient demonstrates decreased serratus activation and strength on the R UE. Patient demonstrates decreased muscular endurance and coordination with performing overhead activity indicated by increased fatigue with exercise performance. Patient will benefit from further skilled therapy to return to prior level of function.    Rehab Potential Good   Clinical Impairments Affecting Rehab Potential (-) Chronicity (+) Previous strength and highly motivated   PT Frequency 2x / week   PT Duration 6 weeks   PT Treatment/Interventions ADLs/Self Care Home Management;Cryotherapy;Electrical Stimulation;Ultrasound;Moist Heat;Iontophoresis 4mg /ml Dexamethasone;Gait training;Stair training;Therapeutic activities;Therapeutic exercise;Patient/family education;Neuromuscular re-education;Manual techniques;Dry needling;Passive range of motion;Scar mobilization   PT Next Visit Plan Mobility exercises, coordination and manual therapy to decrease pain   Consulted and Agree with Plan of Care Patient      Patient will benefit from skilled therapeutic intervention in order to improve the following deficits and impairments:  Pain, Increased fascial restricitons,  Impaired sensation, Decreased mobility, Decreased coordination, Increased muscle spasms, Postural dysfunction, Decreased endurance, Decreased activity tolerance, Decreased range of motion, Decreased strength, Decreased balance  Visit Diagnosis: Chronic right shoulder pain  Stiffness of right shoulder, not elsewhere classified     Problem List Patient Active Problem List   Diagnosis Date Noted  . Rheumatoid arthritis ([place]) 12/09/2016    Blythe Stanford, PT DPT 04/12/2017, 4:18 PM  [facility] [address], [place], [address] Phone: [phone]   Fax:  [phone]  Name: ITZEL MCKIBBIN MRN: [id number] Date of Birth: 03-06-1976

## 2017-04-17 ENCOUNTER — Ambulatory Visit: Payer: Managed Care, Other (non HMO)

## 2017-04-17 DIAGNOSIS — G8929 Other chronic pain: Secondary | ICD-10-CM

## 2017-04-17 DIAGNOSIS — M25611 Stiffness of right shoulder, not elsewhere classified: Secondary | ICD-10-CM

## 2017-04-17 DIAGNOSIS — M25511 Pain in right shoulder: Secondary | ICD-10-CM | POA: Diagnosis not present

## 2017-04-17 NOTE — Therapy (Signed)
[facility] [address], [place], [address] Phone: [phone]   Fax:  [phone]  Physical Therapy Treatment  Patient Details  Name: [name] MRN: [id number] Date of Birth: August 08, 1976 Referring Provider: Stark Jock MD  Encounter Date: 04/17/2017      PT End of Session - 04/17/17 1537    Visit Number 3   Number of Visits 12   Date for PT Re-Evaluation 05/22/17   PT Start Time 1430   PT Stop Time 1515   PT Time Calculation (min) 45 min   Activity Tolerance Patient tolerated treatment well   Behavior During Therapy [facility] for tasks assessed/performed      Past Medical History:  Diagnosis Date  . Bone spur    "front of spine" - effects right shoulder (sometimes)  . Deviated nasal septum   . RA (rheumatoid arthritis) ([place]) 2016  . Rheumatoid arthritis ([place]) 12/09/2016  . Sinusitis   . Thyroid disease     Past Surgical History:  Procedure Laterality Date  . CESAREAN SECTION    . DILATION AND CURETTAGE OF UTERUS     (x2)  . FRONTAL SINUS EXPLORATION Bilateral 11/19/2015   Procedure: FRONTAL SINUS EXPLORATION;  Surgeon: Margaretha Sheffield, MD;  Location: [place];  Service: ENT;  Laterality: Bilateral;  . IMAGE GUIDED SINUS SURGERY N/A 11/19/2015   Procedure: IMAGE GUIDED SINUS SURGERY;  Surgeon: Margaretha Sheffield, MD;  Location: [place];  Service: ENT;  Laterality: N/A;  GAVE DISK TO CE CE 09/22  . MAXILLARY ANTROSTOMY Bilateral 11/19/2015   Procedure: MAXILLARY ANTROSTOMY;  Surgeon: Margaretha Sheffield, MD;  Location: [place];  Service: ENT;  Laterality: Bilateral;  . SEPTOPLASTY N/A 11/19/2015   Procedure: SEPTOPLASTY;  Surgeon: Margaretha Sheffield, MD;  Location: [place];  Service: ENT;  Laterality: N/A;  . SPHENOIDECTOMY Bilateral 11/19/2015   Procedure: Coralee Pesa;  Surgeon: Margaretha Sheffield, MD;  Location: [place];  Service: ENT;  Laterality: Bilateral;   . TONSILLECTOMY      There were no vitals filed for this visit.      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 Patient states she has increased pain today versus previous visit.    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 Yes   Pain Score 6    Pain Location Shoulder   Pain Orientation Right   Pain Type Chronic pain   Pain Onset More than a month ago   Pain Frequency Constant        TREATMENT: Therapeutic Exercise: Shoulder IR against 5# weight at [facility] - x25 Reaching behind the back with therapist support at the scapula with towel assist - x20    Manual Therapy: STM to patient UT and levator scapulae in sitting to decrease increased pain and spasms; Performed STM to latissimus dorsi, subscap triceps, serratus and pec major to decrease pain and spasms in the shoulder. Subscapular trigger points - 3 x 30sec to decrease pain and spasms Supine/sitting 1st rib mobilization to decrease pain and spasms/ improve shoulder AROM - x30sec x 3 B Prone Cervical mobilizations - C5-T2 central P->A's grade IV -- 2  x 20sec    Observation: Patient responds well to treatment with frequent referral pain radiating into her arm with trigger point therapy       PT Education - 04/17/17 1536    Education provided Yes   Education Details Form/technique with exercise   Person(s) Educated Patient   Methods Explanation;Demonstration   Comprehension Verbalized understanding;Returned demonstration             PT Long Term Goals - 04/10/17 1706      PT LONG TERM GOAL #1   Title Patient will improve QuickDASH to under 12% to indicate functional improvement with shoulder function and  overhead movement   Baseline quickdash: 39%   Time 6   Period Weeks   Status New     PT LONG TERM GOAL #2   Title Patient will improve NDI to <10% to demonstrate significant improvement in neck function  and greater ability to sleep.    Baseline NDI: 36%   Time 6   Period Weeks   Status New     PT LONG TERM GOAL #3   Title Patient will be independent with HEP to demonstrate significant improvement in neck/shoulder function and continue benefits of therapy after D/C   Baseline Dependent with form/technique   Time 6   Period Weeks   Status New     PT LONG TERM GOAL #4   Title Patient will be able to perform a full UE workout without increase in pain to indicate improvement in shoulder function and ADLs   Baseline pain with UE exercises   Time 6   Period Weeks   Status New               Plan - 04/17/17 1557    Clinical Impression Statement Performed more manual therapy today focused on decreasing muscular guarding and pain. Patient's pain decreased after performing manual therapy indicating decreased pain and spasms. Patient demonstrates increased dysfunction within the subscapularis and latissimus dorsi. Patient will benefit from further skilled therapy focused on improving shoulder stabilization function to return to prior level of function.    Rehab Potential Good   Clinical Impairments Affecting Rehab Potential (-) Chronicity (+) Previous strength and highly motivated   PT Frequency 2x / week   PT Duration 6 weeks   PT Treatment/Interventions ADLs/Self Care Home Management;Cryotherapy;Electrical Stimulation;Ultrasound;Moist Heat;Iontophoresis 4mg /ml Dexamethasone;Gait training;Stair training;Therapeutic activities;Therapeutic exercise;Patient/family education;Neuromuscular re-education;Manual techniques;Dry needling;Passive range of motion;Scar mobilization   PT Next Visit Plan Mobility exercises, coordination and manual therapy to decrease pain   Consulted and Agree with  Plan of Care Patient      Patient will benefit from skilled therapeutic intervention in order to improve the following deficits and impairments:  Pain, Increased fascial restricitons, Impaired sensation, Decreased mobility, Decreased coordination, Increased muscle spasms, Postural dysfunction, Decreased endurance, Decreased activity tolerance, Decreased range of motion, Decreased strength, Decreased balance  Visit Diagnosis: Chronic right shoulder pain  Stiffness of right shoulder, not elsewhere classified     Problem List Patient Active Problem List   Diagnosis Date Noted  . Rheumatoid arthritis ([place]) 12/09/2016    Blythe Stanford, PT DPT 04/17/2017, 4:14 PM  [facility] [address] [place], [address] Phone: [phone]   Fax:  [phone]  Name: KAYLIANNA DETERT MRN: [id number] Date of Birth: 1976/09/11

## 2017-04-19 ENCOUNTER — Ambulatory Visit: Payer: Managed Care, Other (non HMO)

## 2017-04-21 ENCOUNTER — Ambulatory Visit: Payer: Self-pay | Admitting: Physician Assistant

## 2017-04-24 ENCOUNTER — Ambulatory Visit: Payer: Managed Care, Other (non HMO)

## 2017-05-11 ENCOUNTER — Ambulatory Visit: Payer: Managed Care, Other (non HMO)

## 2017-05-18 ENCOUNTER — Encounter: Payer: Self-pay | Admitting: Physician Assistant

## 2017-05-18 ENCOUNTER — Ambulatory Visit: Payer: Self-pay | Admitting: Physician Assistant

## 2017-05-18 VITALS — BP 100/80 | HR 84 | Temp 98.5°F | Resp 16 | Ht 67.0 in | Wt 150.0 lb

## 2017-05-18 DIAGNOSIS — Z0000 Encounter for general adult medical examination without abnormal findings: Secondary | ICD-10-CM

## 2017-05-18 DIAGNOSIS — R790 Abnormal level of blood mineral: Secondary | ICD-10-CM

## 2017-05-18 NOTE — Addendum Note (Signed)
Addended by: Rudene Anda T on: 05/18/2017 03:21 PM   Modules accepted: Orders

## 2017-05-18 NOTE — Progress Notes (Signed)
   Subjective: Physical Exam    Patient ID: [name], female    DOB: 22-Aug-1976, 41 y.o.   MRN: [id number]  HPI Patient present for annual physical. Voices no compliant.   Review of Systems    Negative Objective:   Physical Exam HEENT unremarkable. Neck supple, w/o adenopathy or burits.  Wearing of armour vest hinders exam. No obvious deformity of upper and lower extremities.       Assessment & Plan:Well exam.  Follow PRN.

## 2017-05-19 LAB — IRON: Iron: 175 ug/dL — ABNORMAL HIGH (ref 27–159)

## 2017-05-19 LAB — B12 AND FOLATE PANEL
Folate: >20 ng/mL (ref >3.0)
Vitamin B-12: 1179 pg/mL (ref 232–1245)

## 2017-05-31 NOTE — Addendum Note (Signed)
Addended by: Rudene Anda T on: 05/31/2017 04:15 PM   Modules accepted: Orders

## 2017-06-13 ENCOUNTER — Encounter: Payer: Self-pay | Admitting: Oncology

## 2017-06-13 ENCOUNTER — Inpatient Hospital Stay: Payer: Managed Care, Other (non HMO) | Attending: Oncology | Admitting: Oncology

## 2017-06-13 ENCOUNTER — Inpatient Hospital Stay: Payer: Managed Care, Other (non HMO)

## 2017-06-13 VITALS — BP 113/80 | HR 79 | Temp 96.9°F | Resp 20 | Ht 67.0 in | Wt 152.0 lb

## 2017-06-13 DIAGNOSIS — Z79899 Other long term (current) drug therapy: Secondary | ICD-10-CM | POA: Insufficient documentation

## 2017-06-13 DIAGNOSIS — M069 Rheumatoid arthritis, unspecified: Secondary | ICD-10-CM | POA: Diagnosis not present

## 2017-06-13 DIAGNOSIS — E039 Hypothyroidism, unspecified: Secondary | ICD-10-CM | POA: Diagnosis not present

## 2017-06-13 DIAGNOSIS — R790 Abnormal level of blood mineral: Secondary | ICD-10-CM | POA: Diagnosis not present

## 2017-06-13 DIAGNOSIS — R7989 Other specified abnormal findings of blood chemistry: Secondary | ICD-10-CM | POA: Diagnosis not present

## 2017-06-13 DIAGNOSIS — R945 Abnormal results of liver function studies: Secondary | ICD-10-CM

## 2017-06-13 DIAGNOSIS — M1990 Unspecified osteoarthritis, unspecified site: Secondary | ICD-10-CM

## 2017-06-13 DIAGNOSIS — R899 Unspecified abnormal finding in specimens from other organs, systems and tissues: Secondary | ICD-10-CM

## 2017-06-13 DIAGNOSIS — D7589 Other specified diseases of blood and blood-forming organs: Secondary | ICD-10-CM | POA: Diagnosis not present

## 2017-06-13 LAB — COMPREHENSIVE METABOLIC PANEL
ALT: 21 U/L (ref 14–54)
AST: 23 U/L (ref 15–41)
Albumin: 4.6 g/dL (ref 3.5–5.0)
Alkaline Phosphatase: 41 U/L (ref 38–126)
Anion gap: 9 (ref 5–15)
BUN: 20 mg/dL (ref 6–20)
CO2: 24 mmol/L (ref 22–32)
Calcium: 9.2 mg/dL (ref 8.9–10.3)
Chloride: 103 mmol/L (ref 101–111)
Creatinine, Ser: 1.15 mg/dL — ABNORMAL HIGH (ref 0.44–1.00)
GFR calc Af Amer: >60 mL/min (ref >60)
GFR calc non Af Amer: 58 mL/min — ABNORMAL LOW (ref >60)
Glucose, Bld: 95 mg/dL (ref 65–99)
Potassium: 3.5 mmol/L (ref 3.5–5.1)
Sodium: 136 mmol/L (ref 135–145)
Total Bilirubin: 1.2 mg/dL (ref 0.3–1.2)
Total Protein: 7.3 g/dL (ref 6.5–8.1)

## 2017-06-13 LAB — CBC WITH DIFFERENTIAL/PLATELET
Basophils Absolute: 0.1 10*3/uL (ref 0–0.1)
Basophils Relative: 2 %
Eosinophils Absolute: 0 10*3/uL (ref 0–0.7)
Eosinophils Relative: 1 %
HCT: 39.7 % (ref 35.0–47.0)
Hemoglobin: 13.9 g/dL (ref 12.0–16.0)
Lymphocytes Relative: 51 %
Lymphs Abs: 2 10*3/uL (ref 1.0–3.6)
MCH: 34.1 pg — ABNORMAL HIGH (ref 26.0–34.0)
MCHC: 35.2 g/dL (ref 32.0–36.0)
MCV: 97 fL (ref 80.0–100.0)
Monocytes Absolute: 0.2 10*3/uL (ref 0.2–0.9)
Monocytes Relative: 6 %
Neutro Abs: 1.6 10*3/uL (ref 1.4–6.5)
Neutrophils Relative %: 40 %
Platelets: 235 10*3/uL (ref 150–440)
RBC: 4.09 MIL/uL (ref 3.80–5.20)
RDW: 11.9 % (ref 11.5–14.5)
WBC: 4 10*3/uL (ref 3.6–11.0)

## 2017-06-13 LAB — IRON AND TIBC
Iron: 148 ug/dL (ref 28–170)
Saturation Ratios: 41 % — ABNORMAL HIGH (ref 10.4–31.8)
TIBC: 365 ug/dL (ref 250–450)
UIBC: 217 ug/dL

## 2017-06-13 LAB — PATHOLOGIST SMEAR REVIEW

## 2017-06-13 LAB — FERRITIN: Ferritin: 40 ng/mL (ref 11–307)

## 2017-06-13 NOTE — Progress Notes (Signed)
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 Her recent bloodwork was as follows: CBC from 03/30/2017 white count of 4.1, H&H of 13.6/41.9 with an MCV of 103.2 and platelet count of 230. Differential on the CBC was normal. CMP showed elevated AST and ALT of 52 and 54 respectively. Her rheumatologist closer all the workup was essentially negative except for a positive ANA. She has a history of rheumatoid arthritis and her mother and grandfather but based on her blood work she has not been found to have any sero- positive arthritis or vasculitis. She also has a history of hypothyroidism and has been slowly increasing her levothyroxine dose and is currently on 25 g. Patient does complain of diffuse joint pain mainly hurts small joints of her hands as well as knee and ankle. It waxes and wanes and patient is currently taking meloxicam every day for the same. Also reports occasional palpitations and fatigue. She has noticed some yellowish discoloration along her nail edges. Her recent blood work showed B12 and folate was within normal limits and serum iron was mildly high at 175 and hence has been referred to [place]. No family history of any blood disorder. She is not a known diabetic  ECOG PS- 0  Pain scale- 3   Review of systems- Review of Systems  Constitutional: Positive for malaise/fatigue. Negative for chills, fever and weight loss.  HENT: Negative for congestion, ear discharge and nosebleeds.   Eyes: Negative for blurred vision.    Respiratory: Negative for cough, hemoptysis, sputum production, shortness of breath and wheezing.   Cardiovascular: Positive for palpitations. Negative for chest pain, orthopnea and claudication.  Gastrointestinal: Negative for abdominal pain, blood in stool, constipation, diarrhea, heartburn, melena, nausea and vomiting.  Genitourinary: Negative for dysuria, flank pain, frequency, hematuria and urgency.  Musculoskeletal: Positive for joint pain. Negative for back pain and myalgias.  Skin: Negative for rash.       Yellowish discoloration of nails  Neurological: Negative for dizziness, tingling, focal weakness, seizures, weakness and headaches.  Endo/Heme/Allergies: Does not bruise/bleed easily.  Psychiatric/Behavioral: Negative for depression and suicidal ideas. The patient does not have insomnia.     Allergies  Allergen Reactions  . Penicillins Swelling    Pt states caused throat to swell   . Cefdinir Palpitations  . Sulfa Antibiotics Other (See Comments)    Lips tingled    Patient Active Problem List   Diagnosis Date Noted  . Rheumatoid arthritis ([place]) 12/09/2016     Past Medical History:  Diagnosis Date  . Bone spur    "front of spine" - effects right shoulder (sometimes)  . Deviated nasal septum   . RA (rheumatoid arthritis) ([place]) 2016  . Rheumatoid arthritis ([place]) 12/09/2016  . Sinusitis   . Thyroid disease      Past Surgical History:  Procedure Laterality Date  . CESAREAN SECTION    . DILATION AND CURETTAGE OF UTERUS     (x2)  . FRONTAL SINUS EXPLORATION Bilateral 11/19/2015   Procedure: FRONTAL SINUS EXPLORATION;  Surgeon: Margaretha Sheffield, MD;  Location: [place];  Service: ENT;  Laterality: Bilateral;  . IMAGE GUIDED SINUS SURGERY N/A 11/19/2015   Procedure: IMAGE GUIDED SINUS SURGERY;  Surgeon: Margaretha Sheffield, MD;  Location: [place];  Service: ENT;  Laterality: N/A;  GAVE DISK TO CE CE 09/22  . MAXILLARY ANTROSTOMY Bilateral 11/19/2015    Procedure: MAXILLARY ANTROSTOMY;  Surgeon: Margaretha Sheffield, MD;  Location: [place];  Service: ENT;  Laterality: Bilateral;  . SEPTOPLASTY N/A 11/19/2015   Procedure: SEPTOPLASTY;  Surgeon: Margaretha Sheffield, MD;  Location: [place];  Service: ENT;  Laterality: N/A;  . SPHENOIDECTOMY Bilateral 11/19/2015   Procedure: Coralee Pesa;  Surgeon: Margaretha Sheffield, MD;  Location: [place];  Service: ENT;  Laterality: Bilateral;  . TONSILLECTOMY      Social History   Social History  . Marital status: Divorced    Spouse name: N/A  . Number of children: N/A  . Years of education: N/A   Occupational History  . Not on file.   Social History Main Topics  . Smoking status: Never Smoker  . Smokeless tobacco: Never Used  . Alcohol use 0.0 oz/week     Comment: rare  . Drug use: No  . Sexual activity: Yes    Birth control/ protection: Pill   Other Topics Concern  . Not on file   Social History Narrative  . No narrative on file     Family History  Problem Relation Age of Onset  . Rheum arthritis Mother   . Cancer Paternal Grandfather   . Cancer Cousin      Current Outpatient Prescriptions:  .  liothyronine (CYTOMEL) 5 MCG tablet, Take 20 mcg by mouth daily. , Disp: , Rfl:  .  meloxicam (MOBIC) 15 MG tablet, Take 15 mg by mouth daily., Disp: , Rfl:  .  norethindrone-ethinyl estradiol (MICROGESTIN) 1-20 MG-MCG tablet, Take 1 tablet by mouth daily., Disp: 1 Package, Rfl: 11 .  traZODone (DESYREL) 150 MG tablet, TAKE 1/2 TO 1 TABLET AT BEDTIME, Disp: 30 tablet, Rfl: 6 .  albuterol (PROVENTIL HFA;VENTOLIN HFA) 108 (90 Base) MCG/ACT inhaler, Inhale 2 puffs into the lungs every 6 (six) hours as needed for wheezing or shortness of breath. (Patient not taking: Reported on 06/13/2017), Disp: 1 Inhaler, Rfl: 12 .  diclofenac sodium (VOLTAREN) 1 % GEL, Apply 2 g topically 4 (four) times daily. (Patient not taking: Reported on 06/13/2017), Disp: 100 g, Rfl: 6 .   ipratropium-albuterol (DUONEB) 0.5-2.5 (3) MG/3ML SOLN, Take 3 mLs by nebulization every 4 (four) hours as needed. (Patient not taking: Reported on 06/13/2017), Disp: 360 mL, Rfl: 3 .  MAGNESIUM PO, Take by mouth., Disp: , Rfl:  .  Multiple Vitamin (MULTIVITAMIN) tablet, Take 1 tablet by mouth daily., Disp: , Rfl:  .  mupirocin ointment (BACTROBAN) 2 %, Apply 1 application topically 2 (two) times daily. (Patient not taking: Reported on 05/18/2017), Disp: 22 g, Rfl: 0 .  Omega-3 Fatty Acids (FISH OIL PO), Take by mouth., Disp: , Rfl:  .  POTASSIUM PO, Take by mouth., Disp: , Rfl:    Physical exam:  Vitals:   06/13/17 1121  BP: 113/80  Pulse: 79  Resp: 20  Temp: (!) 96.9 F (36.1 C)  TempSrc: Tympanic  Weight: 152 lb (68.9 kg)  Height: _0  (1.702 m)   Physical Exam  Constitutional: She is oriented to person, place, and time and well-developed, well-nourished, and in no distress.  HENT:  Head: Normocephalic and atraumatic.  Eyes: EOM are  normal. Pupils are equal, round, and reactive to light.  No icterus  Neck: Normal range of motion.  Cardiovascular: Normal rate, regular rhythm and normal heart sounds.   Pulmonary/Chest: Effort normal and breath sounds normal.  Abdominal: Soft. Bowel sounds are normal.  No palpable splenomegaly  Musculoskeletal:  No evidence of overt joint swelling or inflammation  Lymphadenopathy:  No palpable cervical, axillary or supraclavicular adenopathy. Isolated 1 cm right inguinal LN stable per prior history  Neurological: She is alert and oriented to person, place, and time.  Skin: Skin is warm and dry.       CMP Latest Ref Rng & Units 06/13/2017  Glucose 65 - 99 mg/dL 95  BUN 6 - 20 mg/dL 20  Creatinine 0.44 - 1.00 mg/dL 1.15(H)  Sodium 135 - 145 mmol/L 136  Potassium 3.5 - 5.1 mmol/L 3.5  Chloride 101 - 111 mmol/L 103  CO2 22 - 32 mmol/L 24  Calcium 8.9 - 10.3 mg/dL 9.2  Total Protein 6.5 - 8.1 g/dL 7.3  Total Bilirubin 0.3 - 1.2 mg/dL 1.2    Alkaline Phos 38 - 126 U/L 41  AST 15 - 41 U/L 23  ALT 14 - 54 U/L 21   CBC Latest Ref Rng & Units 06/13/2017  WBC 3.6 - 11.0 K/uL 4.0  Hemoglobin 12.0 - 16.0 g/dL 13.9  Hematocrit 35.0 - 47.0 % 39.7  Platelets 150 - 440 K/uL 235     Assessment and plan- Patient is a 41 y.o. female referred to [place] for high iron  I have reviewed the patient's outside blood work and the most notable findings were mildly elevated AST and ALT, mildly elevated serum iron and macrocytosis without overt anemia  With regards to macrocytosis: B12 and folate is within normal limits. She has been adjusting her thyroid medication and hypothyroidism can sometimes cause macrocytosis and I will therefore check her repeat CBC with differential today as well as pathology review of her smear. We'll also check multiple myeloma panel. She is not on any medications which can cause macrocytosis  With regards to your abnormal LFTs: I will repeat her CMP today and if they are persistently abnormal will consider referral to GI  High iron- this was mildly elevated. I will repeat her efrritin and iron studies today. If her iron stauration and or ferritin is high, I will consider testing her for hemochromatosis given symptoms of fatigue, joint pain and leveated LFT's.   I will see her back in 2 weeks to discuss her bloodwork and further management. I will also obtain prior notes from Dr. Oliva Bustard when she had seen him for mild inguinal adenopathy which did not require any further workup  Thank you for this kind referral and the opportunity to participate in the care of this patient   Visit Diagnosis 1. Arthritis   2. Abnormal laboratory test   3. Elevated liver function tests   4. Macrocytosis without anemia     Dr. Randa Evens, MD, MPH [place] at [facility] Pager- [phone] 06/13/2017

## 2017-06-13 NOTE — Progress Notes (Signed)
Patient here today as new evaluation regarding elevated iron and liver tests.   Patient states she has had extensive lab tests.  She has brought copies of her labs.  She states she noticed about 3 weeks ago that she is having yellowing around her nail beds. She also c/o palpitations.  States she is having joint pain, hair is thinning, and memory fog.  Sometimes her vision is blurred.  She bruises easily and they remain for awhile.  She also states her sex drive is down and even though she is on birth control pills her periods are irregular.  This has been going on for past several months.

## 2017-06-15 LAB — MULTIPLE MYELOMA PANEL, SERUM
Albumin SerPl Elph-Mcnc: 3.8 g/dL (ref 2.9–4.4)
Albumin/Glob SerPl: 1.5 (ref 0.7–1.7)
Alpha 1: 0.3 g/dL (ref 0.0–0.4)
Alpha2 Glob SerPl Elph-Mcnc: 0.6 g/dL (ref 0.4–1.0)
B-Globulin SerPl Elph-Mcnc: 0.9 g/dL (ref 0.7–1.3)
Gamma Glob SerPl Elph-Mcnc: 0.9 g/dL (ref 0.4–1.8)
Globulin, Total: 2.7 g/dL (ref 2.2–3.9)
IgA: 146 mg/dL (ref 87–352)
IgG (Immunoglobin G), Serum: 813 mg/dL (ref 700–1600)
IgM, Serum: 98 mg/dL (ref 26–217)
Total Protein ELP: 6.5 g/dL (ref 6.0–8.5)

## 2017-06-27 ENCOUNTER — Inpatient Hospital Stay: Payer: Managed Care, Other (non HMO) | Admitting: Oncology

## 2017-06-30 ENCOUNTER — Inpatient Hospital Stay: Payer: Managed Care, Other (non HMO) | Admitting: Oncology

## 2017-06-30 NOTE — Progress Notes (Deleted)
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 Her recent bloodwork was as follows: CBC from 03/30/2017 white count of 4.1, H&H of 13.6/41.9 with an MCV of 103.2 and platelet count of 230. Differential on the CBC was normal. CMP showed elevated AST and ALT of 52 and 54 respectively. Her rheumatologist closer all the workup was essentially negative except for a positive ANA. She has a history of rheumatoid arthritis and her mother and grandfather but based on her blood work she has not been found to have any sero- positive arthritis or vasculitis. She also has a history of hypothyroidism and has been slowly increasing her levothyroxine dose and is currently on 25 g. Patient does complain of diffuse joint pain mainly hurts small joints of her hands as well as knee and ankle. It waxes and wanes and patient is currently taking meloxicam every day for the same. Also reports occasional palpitations and fatigue. She has noticed some yellowish discoloration along her nail edges. Her recent blood work showed B12 and folate was within normal limits and serum iron was mildly high at 175 and hence has been referred to [place]. No family history of any blood disorder. She is not a known diabetic. She has been seen by Dr. Alonza Smoker C in the past when she came in with some symptoms of night sweats. This was back in 2012 and further workup at that time was unremarkable   Interval history- ***  ECOG PS- *** Pain scale- *** Opioid associated constipation-  ***  Review of systems- ROS   Allergies  Allergen Reactions  . Penicillins Swelling    Pt states caused throat to swell   . Cefdinir Palpitations  . Sulfa Antibiotics Other (See Comments)    Lips tingled     Past Medical History:  Diagnosis Date  . Bone spur    "front of spine" - effects right shoulder (sometimes)  . Deviated nasal septum   . RA (rheumatoid arthritis) ([place]) 2016  . Rheumatoid arthritis ([place]) 12/09/2016  . Sinusitis   . Thyroid disease      Past Surgical History:  Procedure Laterality Date  . CESAREAN SECTION    . DILATION AND CURETTAGE OF UTERUS     (x2)  . FRONTAL SINUS EXPLORATION Bilateral 11/19/2015   Procedure: FRONTAL SINUS EXPLORATION;  Surgeon: Margaretha Sheffield, MD;  Location: [place];  Service: ENT;  Laterality: Bilateral;  . IMAGE GUIDED SINUS SURGERY N/A 11/19/2015   Procedure: IMAGE GUIDED SINUS SURGERY;  Surgeon: Margaretha Sheffield, MD;  Location: [place];  Service: ENT;  Laterality: N/A;  GAVE DISK TO CE CE 09/22  . MAXILLARY ANTROSTOMY Bilateral 11/19/2015   Procedure: MAXILLARY ANTROSTOMY;  Surgeon: Margaretha Sheffield, MD;  Location: [place];  Service: ENT;  Laterality: Bilateral;  . SEPTOPLASTY N/A 11/19/2015   Procedure: SEPTOPLASTY;  Surgeon: Margaretha Sheffield, MD;  Location: [place];  Service: ENT;  Laterality: N/A;  . SPHENOIDECTOMY Bilateral 11/19/2015   Procedure: Coralee Pesa;  Surgeon: Margaretha Sheffield, MD;  Location: [place];  Service: ENT;  Laterality: Bilateral;  . TONSILLECTOMY      Social History   Social History  . Marital status: Divorced    Spouse name: N/A  . Number of children: N/A  . Years of education: N/A   Occupational History  . Not on file.   Social History Main Topics  . Smoking status: Never Smoker  . Smokeless tobacco: Never Used  . Alcohol use 0.0 oz/week     Comment: rare  . Drug use: No  . Sexual activity: Yes    Birth control/ protection: Pill   Other  Topics Concern  . Not on file   Social History Narrative  . No narrative on file    Family History  Problem Relation Age of Onset  . Rheum arthritis Mother   . Cancer Paternal Grandfather   . Cancer Cousin      Current Outpatient Prescriptions:  .  albuterol (PROVENTIL HFA;VENTOLIN HFA) 108 (90 Base) MCG/ACT inhaler, Inhale 2 puffs into the lungs every 6 (six) hours as needed for wheezing or shortness of breath. (Patient not taking: Reported on 06/13/2017), Disp: 1 Inhaler, Rfl: 12 .  diclofenac sodium (VOLTAREN) 1 % GEL, Apply 2 g topically 4 (four) times daily. (Patient not taking: Reported on 06/13/2017), Disp: 100 g, Rfl: 6 .  ipratropium-albuterol (DUONEB) 0.5-2.5 (3) MG/3ML SOLN, Take 3 mLs by nebulization every 4 (four) hours as needed. (Patient not taking: Reported on 06/13/2017), Disp: 360 mL, Rfl: 3 .  liothyronine (CYTOMEL) 5 MCG tablet, Take 20 mcg by mouth daily. , Disp: , Rfl:  .  MAGNESIUM PO, Take by mouth., Disp: , Rfl:  .  meloxicam (MOBIC) 15 MG tablet, Take 15 mg by mouth daily., Disp: , Rfl:  .  Multiple Vitamin (MULTIVITAMIN) tablet, Take 1 tablet by mouth daily., Disp: , Rfl:  .  mupirocin ointment (BACTROBAN) 2 %, Apply 1 application topically 2 (two) times daily. (Patient not taking: Reported on 05/18/2017), Disp: 22 g, Rfl: 0 .  norethindrone-ethinyl estradiol (MICROGESTIN) 1-20 MG-MCG tablet, Take 1 tablet by mouth daily., Disp: 1 Package, Rfl: 11 .  Omega-3 Fatty Acids (FISH OIL PO), Take by mouth., Disp: , Rfl:  .  POTASSIUM PO, Take by mouth., Disp: , Rfl:  .  traZODone (DESYREL) 150 MG tablet, TAKE 1/2 TO 1 TABLET AT BEDTIME, Disp: 30 tablet, Rfl: 6  Physical exam: There were no vitals filed for this visit. Physical Exam   CMP Latest Ref Rng & Units 06/13/2017  Glucose 65 - 99 mg/dL 95  BUN 6 - 20 mg/dL 20  Creatinine 0.44 - 1.00 mg/dL 1.15(H)  Sodium 135 - 145 mmol/L 136  Potassium 3.5 - 5.1 mmol/L 3.5  Chloride 101 - 111 mmol/L 103  CO2 22 - 32 mmol/L 24   Calcium 8.9 - 10.3 mg/dL 9.2  Total Protein 6.5 - 8.1 g/dL 7.3  Total Bilirubin 0.3 - 1.2 mg/dL 1.2  Alkaline Phos 38 - 126 U/L 41  AST 15 - 41 U/L 23  ALT 14 - 54 U/L 21   CBC Latest Ref Rng & Units 06/13/2017  WBC 3.6 - 11.0 K/uL 4.0  Hemoglobin 12.0 - 16.0 g/dL 13.9  Hematocrit 35.0 - 47.0 % 39.7  Platelets 150 - 440 K/uL 235    No images are attached to the encounter.  No results found.   Assessment and plan- Patient is a 41 y.o. female ***   Visit Diagnosis No diagnosis found.   Dr. Randa Evens, MD, MPH [place] at [facility]  [facility] Center Pager- [phone] 06/30/2017 1:57 PM

## 2017-07-11 ENCOUNTER — Inpatient Hospital Stay: Payer: Managed Care, Other (non HMO) | Admitting: Oncology

## 2017-07-24 ENCOUNTER — Other Ambulatory Visit: Payer: Self-pay | Admitting: Physician Assistant

## 2017-07-24 NOTE — Telephone Encounter (Signed)
Refill for trazadone approved, pt has alternating shifts and will have insomnia, been taking medication without any problems

## 2017-07-25 ENCOUNTER — Telehealth: Payer: Self-pay | Admitting: Physician Assistant

## 2017-07-25 MED ORDER — FLUCONAZOLE 150 MG PO TABS: 150.0000 mg | ORAL_TABLET | Freq: Once | ORAL | 0 refills | Status: DC

## 2017-07-25 NOTE — Telephone Encounter (Signed)
Pt requesting diflucan, [facility]

## 2017-08-11 ENCOUNTER — Telehealth: Payer: Self-pay | Admitting: Oncology

## 2017-08-11 ENCOUNTER — Inpatient Hospital Stay: Payer: Managed Care, Other (non HMO) | Admitting: Oncology

## 2017-08-11 NOTE — Telephone Encounter (Signed)
4th No Show appointment. Sherry/Dr Janese Banks advised. MF

## 2017-08-16 ENCOUNTER — Encounter: Payer: Self-pay | Admitting: *Deleted

## 2017-08-17 ENCOUNTER — Telehealth: Payer: Self-pay | Admitting: *Deleted

## 2017-08-17 NOTE — Telephone Encounter (Signed)
Sent letter certified d/c pt from our practice due to no show x 3.  Called PCP office Ashok Cordia at [facility] and spoke to [place] and let her know that we were d/c from practice due to 3 no show in a row and Beckie Busing will let Manuela Schwartz know about pt no show

## 2017-08-21 ENCOUNTER — Encounter: Payer: Managed Care, Other (non HMO) | Admitting: Certified Nurse Midwife

## 2017-09-07 ENCOUNTER — Encounter: Payer: Self-pay | Admitting: Certified Nurse Midwife

## 2017-09-07 ENCOUNTER — Ambulatory Visit (INDEPENDENT_AMBULATORY_CARE_PROVIDER_SITE_OTHER): Payer: Managed Care, Other (non HMO) | Admitting: Certified Nurse Midwife

## 2017-09-07 VITALS — BP 118/73 | HR 66 | Wt 157.9 lb

## 2017-09-07 DIAGNOSIS — R61 Generalized hyperhidrosis: Secondary | ICD-10-CM | POA: Diagnosis not present

## 2017-09-07 DIAGNOSIS — L680 Hirsutism: Secondary | ICD-10-CM

## 2017-09-07 DIAGNOSIS — N941 Unspecified dyspareunia: Secondary | ICD-10-CM | POA: Diagnosis not present

## 2017-09-07 DIAGNOSIS — R631 Polydipsia: Secondary | ICD-10-CM

## 2017-09-07 DIAGNOSIS — R635 Abnormal weight gain: Secondary | ICD-10-CM | POA: Diagnosis not present

## 2017-09-07 DIAGNOSIS — L709 Acne, unspecified: Secondary | ICD-10-CM | POA: Diagnosis not present

## 2017-09-07 DIAGNOSIS — N926 Irregular menstruation, unspecified: Secondary | ICD-10-CM

## 2017-09-07 DIAGNOSIS — F489 Nonpsychotic mental disorder, unspecified: Secondary | ICD-10-CM | POA: Diagnosis not present

## 2017-09-07 DIAGNOSIS — R4589 Other symptoms and signs involving emotional state: Secondary | ICD-10-CM

## 2017-09-07 DIAGNOSIS — L658 Other specified nonscarring hair loss: Secondary | ICD-10-CM

## 2017-09-07 NOTE — Progress Notes (Signed)
GYN ENCOUNTER NOTE  Subjective:       [name] is a 41 y.o. [phone] female here for gynecologic evaluation of irregular menses, acne, unwanted body hair, abdominal cramping, dyspareunia with insertion and thursting, increased thirst and sweating, moodiness, and weight gain over the last 10 months.   After conducting research online, patient questions PCOS.   Denies difficulty breathing or respiratory distress, chest pain, abdominal pain, excessive vaginal bleeding, dysuria, and leg pain or swelling.   History significant for hypothyroidism recently diagnosed and currently treated with cytomel 25 mcg.    Gynecologic History  Patient's last menstrual period was 08/22/2017 (exact date).  Contraception: OCP (estrogen/progesterone)  Last Pap: 10/2015. Results were: normal  Obstetric History  OB History  Gravida Para Term Preterm AB Living  3 2 1 1 1 1   SAB TAB Ectopic Multiple Live Births  1       1    # Outcome Date GA Lbr Len/2nd Weight Sex Delivery Anes PTL Lv  3 Preterm 2004   6 lb 5 oz (2.863 kg) F CS-Unspec  Y LIV     Complications: Placenta Previa  2 Term 2002   7 lb 9 oz (3.43 kg) M Vag-Spont  N      Complications: Preeclampsia, severe, third trimester  1 SAB 2000        ND      Past Medical History:  Diagnosis Date  . Bone spur    "front of spine" - effects right shoulder (sometimes)  . Deviated nasal septum   . RA (rheumatoid arthritis) ([place]) 2016  . Rheumatoid arthritis ([place]) 12/09/2016  . Sinusitis   . Thyroid disease     Past Surgical History:  Procedure Laterality Date  . CESAREAN SECTION    . DILATION AND CURETTAGE OF UTERUS     (x2)  . FRONTAL SINUS EXPLORATION Bilateral 11/19/2015   Procedure: FRONTAL SINUS EXPLORATION;  Surgeon: Margaretha Sheffield, MD;  Location: [facility];  Service: ENT;  Laterality: Bilateral;  . IMAGE GUIDED SINUS SURGERY N/A 11/19/2015   Procedure: IMAGE GUIDED SINUS SURGERY;  Surgeon: Margaretha Sheffield, MD;  Location:  [facility];  Service: ENT;  Laterality: N/A;  GAVE DISK TO CE CE 09/22  . MAXILLARY ANTROSTOMY Bilateral 11/19/2015   Procedure: MAXILLARY ANTROSTOMY;  Surgeon: Margaretha Sheffield, MD;  Location: [facility];  Service: ENT;  Laterality: Bilateral;  . SEPTOPLASTY N/A 11/19/2015   Procedure: SEPTOPLASTY;  Surgeon: Margaretha Sheffield, MD;  Location: [facility];  Service: ENT;  Laterality: N/A;  . SPHENOIDECTOMY Bilateral 11/19/2015   Procedure: Coralee Pesa;  Surgeon: Margaretha Sheffield, MD;  Location: [facility];  Service: ENT;  Laterality: Bilateral;  . TONSILLECTOMY      Current Outpatient Prescriptions on File Prior to Visit  Medication Sig Dispense Refill  . albuterol (PROVENTIL HFA;VENTOLIN HFA) 108 (90 Base) MCG/ACT inhaler Inhale 2 puffs into the lungs every 6 (six) hours as needed for wheezing or shortness of breath. 1 Inhaler 12  . diclofenac sodium (VOLTAREN) 1 % GEL Apply 2 g topically 4 (four) times daily. 100 g 6  . liothyronine (CYTOMEL) 5 MCG tablet Take 25 mcg by mouth daily.     Marland Kitchen MAGNESIUM PO Take by mouth.     No current facility-administered medications on file prior to visit.     Allergies  Allergen Reactions  . Penicillins Swelling    Pt states caused throat to swell   . Cefdinir Palpitations  . Sulfa Antibiotics Other (  See Comments)    Lips tingled    Social History   Social History  . Marital status: Divorced    Spouse name: N/A  . Number of children: N/A  . Years of education: N/A   Occupational History  . Not on file.   Social History Main Topics  . Smoking status: Never Smoker  . Smokeless tobacco: Never Used  . Alcohol use 0.0 oz/week     Comment: rare  . Drug use: No  . Sexual activity: Yes    Birth control/ protection: Pill   Other Topics Concern  . Not on file   Social History Narrative  . No narrative on file    Family History  Problem Relation Age of Onset  . Rheum arthritis Mother   . Cancer Paternal  Grandfather   . Cancer Cousin     The following portions of the patient's history were reviewed and updated as appropriate: allergies, current medications, past family history, past medical history, past social history, past surgical history and problem list.  Review of Systems  Review of Systems - Negative except as noted above.  History obtained from the patient.   Objective:   BP 118/73   Pulse 66   Wt 157 lb 14.4 oz (71.6 kg)   LMP 08/22/2017 (Exact Date)   BMI 24.73 kg/m   Alert and oriented, no apparent distress.   Physical exam: not indicated.   Assessment:   1. Irregular menses  - US PELVIS TRANSVANGINAL NON-OB (TV ONLY); Future - DHEA-sulfate; Future - Insulin, random; Future - Progesterone; Future - Prolactin; Future - Testosterone, Free, Total, SHBG; Future - FSH/LH; Future - Estradiol; Future - CBC; Future - Comprehensive metabolic panel; Future - Lipid panel; Future  2. Increased thirst  - DHEA-sulfate; Future - Insulin, random; Future - Progesterone; Future - Prolactin; Future - Testosterone, Free, Total, SHBG; Future - FSH/LH; Future - Estradiol; Future - CBC; Future - Comprehensive metabolic panel; Future - Lipid panel; Future  3. Sweating profusely  - DHEA-sulfate; Future - Insulin, random; Future - Progesterone; Future - Prolactin; Future - Testosterone, Free, Total, SHBG; Future - FSH/LH; Future - Estradiol; Future - CBC; Future - Comprehensive metabolic panel; Future - Lipid panel; Future  4. 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 Hirsutism  - US PELVIS TRANSVANGINAL NON-OB (TV ONLY); Future - DHEA-sulfate; Future - Insulin, random; Future - Progesterone; Future - Prolactin; Future - Testosterone, Free, Total, SHBG; Future - FSH/LH; Future - Estradiol; Future - CBC; Future - Comprehensive metabolic panel; Future - Lipid panel; Future  7. Acne, unspecified acne type  - US PELVIS TRANSVANGINAL NON-OB (TV ONLY); Future - DHEA-sulfate; Future - Insulin, random; Future - Progesterone; Future - Prolactin; Future - Testosterone, Free, Total, SHBG; Future - FSH/LH; Future - Estradiol; Future - CBC; Future - Comprehensive metabolic panel; Future - Lipid panel; Future  8. Dyspareunia in female  - US PELVIS TRANSVANGINAL NON-OB (TV ONLY); Future - DHEA-sulfate; Future - Insulin, random; Future - Progesterone; Future - Prolactin; Future - Testosterone, Free,  Total, SHBG; Future - FSH/LH; Future - Estradiol; Future - CBC; Future - Comprehensive metabolic panel; Future - Lipid panel; Future  9. Moodiness  - US PELVIS TRANSVANGINAL NON-OB (TV ONLY); Future - DHEA-sulfate; Future - Insulin, random; Future - Progesterone; Future - Prolactin; Future - Testosterone, Free, Total, SHBG; Future - FSH/LH; Future - Estradiol; Future - CBC; Future - Comprehensive metabolic panel; Future - Lipid panel; Future     Plan:   Discussed diagnostic and treatment options.   Reviewed red flag symptoms and when to call.   RTC x 1-2 weeks for labs and [place].   RTC x 2-3 weeks for results review and POC discussion.   Diona Fanti, CNM

## 2017-09-07 NOTE — Patient Instructions (Signed)
Polycystic Ovarian Syndrome Polycystic ovarian syndrome (PCOS) is a common hormonal disorder among women of reproductive age. In most women with PCOS, many small fluid-filled sacs (cysts) grow on the ovaries, and the cysts are not part of a normal menstrual cycle. PCOS can cause problems with your menstrual periods and make it difficult to get pregnant. It can also cause an increased risk of miscarriage with pregnancy. If it is not treated, PCOS can lead to serious health problems, such as diabetes and heart disease. What are the causes? The cause of PCOS is not known, but it may be the result of a combination of certain factors, such as:  Irregular menstrual cycle.  High levels of certain hormones (androgens).  Problems with the hormone that helps to control blood sugar (insulin resistance).  Certain genes.  What increases the risk? This condition is more likely to develop in women who have a family history of PCOS. What are the signs or symptoms? Symptoms of PCOS may include:  Multiple ovarian cysts.  Infrequent periods or no periods.  Periods that are too frequent or too heavy.  Unpredictable periods.  Inability to get pregnant (infertility) because of not ovulating.  Increased growth of hair on the face, chest, stomach, back, thumbs, thighs, or toes.  Acne or oily skin. Acne may develop during adulthood, and it may not respond to treatment.  Pelvic pain.  Weight gain or obesity.  Patches of thickened and dark brown or black skin on the neck, arms, breasts, or thighs (acanthosis nigricans).  Excess hair growth on the face, chest, abdomen, or upper thighs (hirsutism).  How is this diagnosed? This condition is diagnosed based on:  Your medical history.  A physical exam, including a pelvic exam. Your health care provider may look for areas of increased hair growth on your skin.  Tests, such as: ? Ultrasound. This may be used to examine the ovaries and the lining of the  uterus (endometrium) for cysts. ? Blood tests. These may be used to check levels of sugar (glucose), female hormone (testosterone), and female hormones (estrogen and progesterone) in your blood.  How is this treated? There is no cure for PCOS, but treatment can help to manage symptoms and prevent more health problems from developing. Treatment varies depending on:  Your symptoms.  Whether you want to have a baby or whether you need birth control (contraception).  Treatment may include nutrition and lifestyle changes along with:  Progesterone hormone to start a menstrual period.  Birth control pills to help you have regular menstrual periods.  Medicines to make you ovulate, if you want to get pregnant.  Medicine to reduce excessive hair growth.  Surgery, in severe cases. This may involve making small holes in one or both of your ovaries. This decreases the amount of testosterone that your body produces.  Follow these instructions at home:  Take over-the-counter and prescription medicines only as told by your health care provider.  Follow a healthy meal plan. This can help you reduce the effects of PCOS. ? Eat a healthy diet that includes lean proteins, complex carbohydrates, fresh fruits and vegetables, low-fat dairy products, and healthy fats. Make sure to eat enough fiber.  If you are overweight, lose weight as told by your health care provider. ? Losing 10% of your body weight may improve symptoms. ? Your health care provider can determine how much weight loss is best for you and can help you lose weight safely.  Keep all follow-up visits as told by   your health care provider. This is important. Contact a health care provider if:  Your symptoms do not get better with medicine.  You develop new symptoms. This information is not intended to replace advice given to you by your health care provider. Make sure you discuss any questions you have with your health care  provider. Document Released: 03/24/2005 Document Revised: 07/26/2016 Document Reviewed: 05/15/2016 Elsevier Interactive Patient Education  2018 [employer]  Diet for Polycystic Ovarian Syndrome Polycystic ovary syndrome (PCOS) is a disorder of the chemical messengers (hormones) that regulate menstruation. The condition causes important hormones to be out of balance. PCOS can:  Make your periods irregular or stop.  Cause cysts to develop on the ovaries.  Make it difficult to get pregnant.  Stop your body from responding to the effects of insulin (insulin resistance), which can lead to obesity and diabetes.  Changing what you eat can help manage PCOS and improve your health. It can help you lose weight and improve the way your body uses insulin. What is my plan?  Eat breakfast, lunch, and dinner plus two snacks every day.  Include protein in each meal and snack.  Choose whole grains instead of products made with refined flour.  Eat a variety of foods.  Exercise regularly as told by your health care provider. What do I need to know about this eating plan? If you are overweight or obese, pay attention to how many calories you eat. Cutting down on calories can help you lose weight. Work with your health care provider or dietitian to figure out how many calories you need each day. What foods can I eat? Grains Whole grains, such as whole wheat. Whole-grain breads, crackers, cereals, and pasta. Unsweetened oatmeal, bulgur, barley, quinoa, or brown rice. Corn or whole-wheat flour tortillas. Vegetables  Lettuce. Spinach. Peas. Beets. Cauliflower. Cabbage. Broccoli. Carrots. Tomatoes. Squash. Eggplant. Herbs. Peppers. Onions. Cucumbers. Brussels sprouts. Fruits Berries. Bananas. Apples. Oranges. Grapes. Papaya. Mango. Pomegranate. Kiwi. Grapefruit. Cherries. Meats and Other Protein Sources Lean proteins, such as fish, chicken, beans, eggs, and tofu. Dairy Low-fat dairy products, such  as skim milk, cheese sticks, and yogurt. Beverages Low-fat or fat-free drinks, such as water, low-fat milk, sugar-free drinks, and 100% fruit juice. Condiments Ketchup. Mustard. Barbecue sauce. Relish. Low-fat or fat-free mayonnaise. Fats and Oils Olive oil or canola oil. Walnuts and almonds. The items listed above may not be a complete list of recommended foods or beverages. Contact your dietitian for more options. What foods are not recommended? Foods high in calories or fat. Fried foods. Sweets. Products made from refined white flour, including white bread, pastries, white rice, and pasta. The items listed above may not be a complete list of foods and beverages to avoid. Contact your dietitian for more information. This information is not intended to replace advice given to you by your health care provider. Make sure you discuss any questions you have with your health care provider. Document Released: 03/21/2016 Document Revised: 05/05/2016 Document Reviewed: 12/10/2014 Elsevier Interactive Patient Education  2018 [employer]  

## 2017-09-11 ENCOUNTER — Ambulatory Visit: Payer: Managed Care, Other (non HMO) | Admitting: Family Medicine

## 2017-09-18 ENCOUNTER — Ambulatory Visit: Payer: Managed Care, Other (non HMO)

## 2017-09-18 DIAGNOSIS — R631 Polydipsia: Secondary | ICD-10-CM

## 2017-09-18 DIAGNOSIS — L658 Other specified nonscarring hair loss: Secondary | ICD-10-CM

## 2017-09-18 DIAGNOSIS — R61 Generalized hyperhidrosis: Secondary | ICD-10-CM

## 2017-09-18 DIAGNOSIS — N941 Unspecified dyspareunia: Secondary | ICD-10-CM

## 2017-09-18 DIAGNOSIS — R4589 Other symptoms and signs involving emotional state: Secondary | ICD-10-CM

## 2017-09-18 DIAGNOSIS — R635 Abnormal weight gain: Secondary | ICD-10-CM

## 2017-09-18 DIAGNOSIS — L680 Hirsutism: Secondary | ICD-10-CM

## 2017-09-18 DIAGNOSIS — N926 Irregular menstruation, unspecified: Secondary | ICD-10-CM

## 2017-09-18 DIAGNOSIS — L709 Acne, unspecified: Secondary | ICD-10-CM

## 2017-09-19 LAB — COMPREHENSIVE METABOLIC PANEL
ALT: 17 IU/L (ref 0–32)
AST: 23 IU/L (ref 0–40)
Albumin/Globulin Ratio: 2 (ref 1.2–2.2)
Albumin: 4 g/dL (ref 3.5–5.5)
Alkaline Phosphatase: 51 IU/L (ref 39–117)
BUN/Creatinine Ratio: 18 (ref 9–23)
BUN: 18 mg/dL (ref 6–24)
Bilirubin Total: 0.3 mg/dL (ref 0.0–1.2)
CO2: 25 mmol/L (ref 20–29)
Calcium: 9 mg/dL (ref 8.7–10.2)
Chloride: 105 mmol/L (ref 96–106)
Creatinine, Ser: 1 mg/dL (ref 0.57–1.00)
GFR calc Af Amer: 80 mL/min/{1.73_m2} (ref >59)
GFR calc non Af Amer: 69 mL/min/{1.73_m2} (ref >59)
Globulin, Total: 2 g/dL (ref 1.5–4.5)
Glucose: 73 mg/dL (ref 65–99)
Potassium: 4.5 mmol/L (ref 3.5–5.2)
Sodium: 144 mmol/L (ref 134–144)
Total Protein: 6 g/dL (ref 6.0–8.5)

## 2017-09-19 LAB — CBC
Hematocrit: 40.6 % (ref 34.0–46.6)
Hemoglobin: 13.2 g/dL (ref 11.1–15.9)
MCH: 33.2 pg — ABNORMAL HIGH (ref 26.6–33.0)
MCHC: 32.5 g/dL (ref 31.5–35.7)
MCV: 102 fL — ABNORMAL HIGH (ref 79–97)
Platelets: 243 10*3/uL (ref 150–379)
RBC: 3.97 x10E6/uL (ref 3.77–5.28)
RDW: 11.8 % — ABNORMAL LOW (ref 12.3–15.4)
WBC: 5 10*3/uL (ref 3.4–10.8)

## 2017-09-19 LAB — ESTRADIOL: Estradiol: 302.4 pg/mL

## 2017-09-19 LAB — FSH/LH
FSH: 14.9 m[IU]/mL
LH: 10.1 m[IU]/mL

## 2017-09-19 LAB — DHEA-SULFATE: DHEA-SO4: 77.9 ug/dL (ref 57.3–279.2)

## 2017-09-19 LAB — LIPID PANEL
Chol/HDL Ratio: 2.3 ratio (ref 0.0–4.4)
Cholesterol, Total: 184 mg/dL (ref 100–199)
HDL: 79 mg/dL (ref >39)
LDL Calculated: 91 mg/dL (ref 0–99)
Triglycerides: 71 mg/dL (ref 0–149)
VLDL Cholesterol Cal: 14 mg/dL (ref 5–40)

## 2017-09-19 LAB — TESTOSTERONE, FREE, TOTAL, SHBG
Sex Hormone Binding: 183 nmol/L — ABNORMAL HIGH (ref 24.6–122.0)
Testosterone, Free: 0.3 pg/mL (ref 0.0–4.2)
Testosterone: <3 ng/dL — ABNORMAL LOW (ref 8–48)

## 2017-09-19 LAB — INSULIN, RANDOM: INSULIN: 8 u[IU]/mL (ref 2.6–24.9)

## 2017-09-19 LAB — PROLACTIN: Prolactin: 8.5 ng/mL (ref 4.8–23.3)

## 2017-09-19 LAB — PROGESTERONE: Progesterone: 0.2 ng/mL

## 2017-09-22 ENCOUNTER — Ambulatory Visit (INDEPENDENT_AMBULATORY_CARE_PROVIDER_SITE_OTHER): Payer: Managed Care, Other (non HMO) | Admitting: Certified Nurse Midwife

## 2017-09-22 ENCOUNTER — Encounter: Payer: Self-pay | Admitting: Certified Nurse Midwife

## 2017-09-22 VITALS — BP 104/58 | HR 77 | Ht 67.0 in | Wt 159.3 lb

## 2017-09-22 DIAGNOSIS — L658 Other specified nonscarring hair loss: Secondary | ICD-10-CM

## 2017-09-22 DIAGNOSIS — D259 Leiomyoma of uterus, unspecified: Secondary | ICD-10-CM

## 2017-09-22 DIAGNOSIS — Z86018 Personal history of other benign neoplasm: Secondary | ICD-10-CM

## 2017-09-22 DIAGNOSIS — L680 Hirsutism: Secondary | ICD-10-CM

## 2017-09-22 MED ORDER — SPIRONOLACTONE 100 MG PO TABS: 100.0000 mg | ORAL_TABLET | Freq: Every day | ORAL | 1 refills | Status: DC

## 2017-09-22 MED ORDER — MINOXIDIL 5 % EX SOLN: 1.0000 mL | Freq: Every day | CUTANEOUS | 5 refills | Status: DC

## 2017-09-22 NOTE — Progress Notes (Signed)
GYN ENCOUNTER NOTE  Subjective:       [name] is a 41 y.o. [phone] female here for results review. She was seen 09/07/2017 for evaluation of irregular menses, increased thirty, profuse sweating, weight gain, female patter hair loss, hirsutism, acne, moodiness, and dyspareunia.  She reports intermittent abdominal cramping and fullness. Denies difficulty breathing or respiratory distress, chest pain, vaginal bleeding, dysuria and leg pain or swelling.   Today, pt reports history of uterine fibroid removal approximately eight (8) years ago.    Gynecologic History  Patient's last menstrual period was 09/13/2017 (exact date).  Contraception: none  Last Pap: 10/2015. Results were: normal  Obstetric History  OB History  Gravida Para Term Preterm AB Living  3 2 1 1 1 1   SAB TAB Ectopic Multiple Live Births  1       1    # Outcome Date GA Lbr Len/2nd Weight Sex Delivery Anes PTL Lv  3 Preterm 2004   6 lb 5 oz (2.863 kg) F CS-Unspec  Y LIV     Complications: Placenta Previa  2 Term 2002   7 lb 9 oz (3.43 kg) M Vag-Spont  N      Complications: Preeclampsia, severe, third trimester  1 SAB 2000        ND      Past Medical History:  Diagnosis Date  . Bone spur    "front of spine" - effects right shoulder (sometimes)  . Deviated nasal septum   . RA (rheumatoid arthritis) ([place]) 2016  . Rheumatoid arthritis ([place]) 12/09/2016  . Sinusitis   . Thyroid disease     Past Surgical History:  Procedure Laterality Date  . CESAREAN SECTION    . DILATION AND CURETTAGE OF UTERUS     (x2)  . FRONTAL SINUS EXPLORATION Bilateral 11/19/2015   Procedure: FRONTAL SINUS EXPLORATION;  Surgeon: Margaretha Sheffield, MD;  Location: [facility];  Service: ENT;  Laterality: Bilateral;  . IMAGE GUIDED SINUS SURGERY N/A 11/19/2015   Procedure: IMAGE GUIDED SINUS SURGERY;  Surgeon: Margaretha Sheffield, MD;  Location: [facility];  Service: ENT;  Laterality: N/A;  GAVE DISK TO CE CE 09/22  .  MAXILLARY ANTROSTOMY Bilateral 11/19/2015   Procedure: MAXILLARY ANTROSTOMY;  Surgeon: Margaretha Sheffield, MD;  Location: [facility];  Service: ENT;  Laterality: Bilateral;  . SEPTOPLASTY N/A 11/19/2015   Procedure: SEPTOPLASTY;  Surgeon: Margaretha Sheffield, MD;  Location: [facility];  Service: ENT;  Laterality: N/A;  . SPHENOIDECTOMY Bilateral 11/19/2015   Procedure: Coralee Pesa;  Surgeon: Margaretha Sheffield, MD;  Location: [facility];  Service: ENT;  Laterality: Bilateral;  . TONSILLECTOMY      Current Outpatient Prescriptions on File Prior to Visit  Medication Sig Dispense Refill  . albuterol (PROVENTIL HFA;VENTOLIN HFA) 108 (90 Base) MCG/ACT inhaler Inhale 2 puffs into the lungs every 6 (six) hours as needed for wheezing or shortness of breath. 1 Inhaler 12  . diclofenac sodium (VOLTAREN) 1 % GEL Apply 2 g topically 4 (four) times daily. 100 g 6  . liothyronine (CYTOMEL) 5 MCG tablet Take 25 mcg by mouth daily.     . meloxicam (MOBIC) 15 MG tablet meloxicam 15 mg tablet    . norethindrone-ethinyl estradiol (MICROGESTIN,JUNEL,LOESTRIN) 1-20 MG-MCG tablet Microgestin 1/20 (21)     No current facility-administered medications on file prior to visit.     Allergies  Allergen Reactions  . Penicillins Swelling    Pt states caused throat to swell   .  Cefdinir Palpitations  . Sulfa Antibiotics Other (See Comments)    Lips tingled    Social History   Social History  . Marital status: Divorced    Spouse name: N/A  . Number of children: N/A  . Years of education: N/A   Occupational History  . Not on file.   Social History Main Topics  . Smoking status: Never Smoker  . Smokeless tobacco: Never Used  . Alcohol use 0.0 oz/week     Comment: rare  . Drug use: No  . Sexual activity: Yes    Birth control/ protection: Pill   Other Topics Concern  . Not on file   Social History Narrative  . No narrative on file    Family History  Problem Relation Age of Onset  .  Rheum arthritis Mother   . Cancer Paternal Grandfather   . Cancer Cousin     The following portions of the patient's history were reviewed and updated as appropriate: allergies, current medications, past family history, past medical history, past social history, past surgical history and problem list.  Review of Systems  Review of Systems - Negative except as noted above. History obtained from the patient  Objective:   BP (!) 104/58   Pulse 77   Ht 5\' 7"  (1.702 m)   Wt 159 lb 5 oz (72.3 kg)   LMP 09/13/2017 (Exact Date)   BMI 24.95 kg/m    CONSTITUTIONAL: Well-developed, well-nourished female in no acute distress.   Labs:  Recent Results (from the past 2160 hour(s))  DHEA-sulfate     Status: None   Collection Time: 09/18/17  2:08 PM  Result Value Ref Range   DHEA-SO4 77.9 57.3 - 279.2 ug/dL  Insulin, random     Status: None   Collection Time: 09/18/17  2:08 PM  Result Value Ref Range   INSULIN 8.0 2.6 - 24.9 uIU/mL  Progesterone     Status: None   Collection Time: 09/18/17  2:08 PM  Result Value Ref Range   Progesterone 0.2 ng/mL    Comment:                      Follicular phase       0.1 -   0.9                      Luteal phase           1.8 -  23.9                      Ovulation phase        0.1 -  12.0                      Pregnant                         First trimester    11.0 -  44.3                         Second trimester   25.4 -  83.3                         Third trimester    58.7 - 214.0                      Postmenopausal  0.0 -   0.1   Prolactin     Status: None   Collection Time: 09/18/17  2:08 PM  Result Value Ref Range   Prolactin 8.5 4.8 - 23.3 ng/mL  Testosterone, Free, Total, SHBG     Status: Abnormal   Collection Time: 09/18/17  2:08 PM  Result Value Ref Range   Testosterone <3 (L) 8 - 48 ng/dL   Testosterone, Free 0.3 0.0 - 4.2 pg/mL   Sex Hormone Binding 183.0 (H) 24.6 - 122.0 nmol/L  FSH/LH     Status: None   Collection Time:  09/18/17  2:08 PM  Result Value Ref Range   LH 10.1 mIU/mL    Comment:                     Adult Female:                       Follicular phase      2.4 -  12.6                       Ovulation phase      14.0 -  95.6                       Luteal phase          1.0 -  11.4                       Postmenopausal        7.7 -  58.5    FSH 14.9 mIU/mL    Comment:                     Adult Female:                       Follicular phase      3.5 -  12.5                       Ovulation phase       4.7 -  21.5                       Luteal phase          1.7 -   7.7                       Postmenopausal       25.8 - 134.8   Estradiol     Status: None   Collection Time: 09/18/17  2:08 PM  Result Value Ref Range   Estradiol 302.4 pg/mL    Comment:                     Adult Female:                       Follicular phase   93.2 -   166.0                       Ovulation phase    85.8 -   498.0                       Luteal phase       43.8 -   211.0  Postmenopausal     <6.0 -    54.7                     Pregnancy                       1st trimester     215.0 - >4300.0                     Girls (1-10 years)    6.0 -    27.0 Roche ECLIA methodology   CBC     Status: Abnormal   Collection Time: 09/18/17  2:08 PM  Result Value Ref Range   WBC 5.0 3.4 - 10.8 x10E3/uL   RBC 3.97 3.77 - 5.28 x10E6/uL   Hemoglobin 13.2 11.1 - 15.9 g/dL   Hematocrit 40.6 34.0 - 46.6 %   MCV 102 (H) 79 - 97 fL   MCH 33.2 (H) 26.6 - 33.0 pg   MCHC 32.5 31.5 - 35.7 g/dL   RDW 11.8 (L) 12.3 - 15.4 %   Platelets 243 150 - 379 x10E3/uL  Comprehensive metabolic panel     Status: None   Collection Time: 09/18/17  2:08 PM  Result Value Ref Range   Glucose 73 65 - 99 mg/dL   BUN 18 6 - 24 mg/dL   Creatinine, Ser 1.00 0.57 - 1.00 mg/dL   GFR calc non Af Amer 69 >59 mL/min/1.73   GFR calc Af Amer 80 >59 mL/min/1.73   BUN/Creatinine Ratio 18 9 - 23   Sodium 144 134 - 144 mmol/L   Potassium 4.5 3.5 -  5.2 mmol/L   Chloride 105 96 - 106 mmol/L   CO2 25 20 - 29 mmol/L   Calcium 9.0 8.7 - 10.2 mg/dL   Total Protein 6.0 6.0 - 8.5 g/dL   Albumin 4.0 3.5 - 5.5 g/dL   Globulin, Total 2.0 1.5 - 4.5 g/dL   Albumin/Globulin Ratio 2.0 1.2 - 2.2   Bilirubin Total 0.3 0.0 - 1.2 mg/dL   Alkaline Phosphatase 51 39 - 117 IU/L   AST 23 0 - 40 IU/L   ALT 17 0 - 32 IU/L  Lipid panel     Status: None   Collection Time: 09/18/17  2:08 PM  Result Value Ref Range   Cholesterol, Total 184 100 - 199 mg/dL   Triglycerides 71 0 - 149 mg/dL   HDL 79 >39 mg/dL    Comment: **Effective October 02, 2017, HDL Cholesterol**   reference interval will be changing to:                                   Female        Female                               40 - [phone]   50 - [phone]    VLDL Cholesterol Cal 14 5 - 40 mg/dL   LDL Calculated 91 0 - 99 mg/dL   Chol/HDL Ratio 2.3 0.0 - 4.4 ratio    Comment:                                   T. Chol/HDL Ratio  Men  Women                               1/2 Avg.Risk  3.4    3.3                                   Avg.Risk  5.0    4.4                                2X Avg.Risk  9.6    7.1                                3X Avg.Risk 23.4   11.0     ULTRASOUND REPORT  Location: [facility] Date of Service: 09/18/17   Indications:Dyspareunia, Irregular cycles, Hirsutism Findings:  The uterus measures 9.5 x 3.6 x 4.1 cm Echo texture is diffusely homogeneous with evidence of a focal mass. Within the uterus there is one suspected fibroid measuring: Fibroid 1: Posterior fundal, intramural vs submucosal = 2.1 x 1.6 x 1.9 cm  The Endometrium measures 2.9 mm.  Right Ovary measures 2.7 x 1.1 x 2.4 cm, and appears WNL with a dominant follicle Left Ovary measures 3.5 x 2.3 x 2.8 cm, and appears WNL with a dominant follicle. Survey of the adnexa demonstrates no adnexal masses. There is no free fluid in the cul de  sac.  Impression: 1. Fibroid uterus - Posterior fundal 2. Dominant follicles seen bilaterally  Recommendations: 1.Clinical correlation with the patient's History and Physical Exam.    Assessment:   1. Hirsutism   2. 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   Discussed treatment options including OCPs, POPs, Provera, hormonal IUDs, and MD referral. Desires referral to MD to discuss fibroid removal.   Rx: Aldactone and Minoxidil, see orders.   Reviewed red flag symptoms and when to call.   RTC x 1-2 weeks for visit with Dr. Marcelline Mates or sooner if needed.    Diona Fanti, CNM

## 2017-09-22 NOTE — Patient Instructions (Addendum)
Medroxyprogesterone tablets What is this medicine? MEDROXYPROGESTERONE (me DROX ee proe JES te rone) is a hormone in a class called progestins. It is commonly used to prevent the uterine lining from overgrowth in women taking an estrogen after menopause. It is also used to treat irregular menstrual bleeding or a lack of menstrual bleeding in women. This medicine may be used for other purposes; ask your health care provider or pharmacist if you have questions. COMMON BRAND NAME(S): Amen, Provera What should I tell my health care provider before I take this medicine? They need to know if you have any of these conditions: -blood vessel disease or a history of a blood clot in the lungs or legs -breast, cervical or vaginal cancer -heart disease -kidney disease -liver disease -migraine -recent miscarriage or abortion -mental depression -migraine -seizures (convulsions) -stroke -vaginal bleeding that has not been evaluated -an unusual or allergic reaction to medroxyprogesterone, other medicines, foods, dyes, or preservatives -pregnant or trying to get pregnant -breast-feeding How should I use this medicine? Take this medicine by mouth with a glass of water. Follow the directions on the prescription label. Take your doses at regular intervals. Do not take your medicine more often than directed. Talk to your pediatrician regarding the use of this medicine in children. Special care may be needed. While this drug may be prescribed for children as young as 13 years for selected conditions, precautions do apply. Overdosage: If you think you have taken too much of this medicine contact a poison control center or emergency room at once. NOTE: This medicine is only for you. Do not share this medicine with others. What if I miss a dose? If you miss a dose, take it as soon as you can. If it is almost time for your next dose, take only that dose. Do not take double or extra doses. What may interact with  this medicine? -barbiturate medicines for inducing sleep or treating seizures (convulsions) -bosentan -carbamazepine -phenytoin -rifampin -St. John's Wort This list may not describe all possible interactions. Give your health care provider a list of all the medicines, herbs, non-prescription drugs, or dietary supplements you use. Also tell them if you smoke, drink alcohol, or use illegal drugs. Some items may interact with your medicine. What should I watch for while using this medicine? Visit your health care professional for regular checks on your progress. You will need a regular breast and pelvic exam. If you have any reason to think you are pregnant, stop taking this medicine at once and contact your doctor or health care professional. What side effects may I notice from receiving this medicine? Side effects that you should report to your doctor or health care professional as soon as possible: -breast tenderness or discharge -changes in mood or emotions, such as depression -changes in vision or speech -pain in the abdomen, chest, groin, or leg -severe headache -skin rash, itching, or hives -sudden shortness of breath -unusually weak or tired -yellowing of skin or eyes Side effects that usually do not require medical attention (report to your doctor or health care professional if they continue or are bothersome): -acne -change in menstrual bleeding pattern or flow -changes in sexual desire -facial hair growth -fluid retention and swelling -headache -upset stomach -weight gain or loss This list may not describe all possible side effects. Call your doctor for medical advice about side effects. You may report side effects to FDA at 1-800-FDA-1088. Where should I keep my medicine? Keep out of the reach of children.  Store at room temperature between 20 and 25 degrees C (68 and 77 degrees F). Throw away any unused medicine after the expiration date. NOTE: This sheet is a summary. It  may not cover all possible information. If you have questions about this medicine, talk to your doctor, pharmacist, or health care provider.  2018 Elsevier/Gold Standard (2008-11-27 11:26:12) Uterine Fibroids Uterine fibroids are tissue masses (tumors) that can develop in the womb (uterus). They are also called leiomyomas. This type of tumor is not cancerous (benign) and does not spread to other parts of the body outside of the pelvic area, which is between the hip bones. Occasionally, fibroids may develop in the fallopian tubes, in the cervix, or on the support structures (ligaments) that surround the uterus. You can have one or many fibroids. Fibroids can vary in size, weight, and where they grow in the uterus. Some can become quite large. Most fibroids do not require medical treatment. What are the causes? A fibroid can develop when a single uterine cell keeps growing (replicating). Most cells in the human body have a control mechanism that keeps them from replicating without control. What are the signs or symptoms? Symptoms may include:  Heavy bleeding during your period.  Bleeding or spotting between periods.  Pelvic pain and pressure.  Bladder problems, such as needing to urinate more often (urinary frequency) or urgently.  Inability to reproduce offspring (infertility).  Miscarriages.  How is this diagnosed? Uterine fibroids are diagnosed through a physical exam. Your health care provider may feel the lumpy tumors during a pelvic exam. Ultrasonography and an MRI may be done to determine the size, location, and number of fibroids. How is this treated? Treatment may include:  Watchful waiting. This involves getting the fibroid checked by your health care provider to see if it grows or shrinks. Follow your health care provider's recommendations for how often to have this checked.  Hormone medicines. These can be taken by mouth or given through an intrauterine device  (IUD).  Surgery. ? Removing the fibroids (myomectomy) or the uterus (hysterectomy). ? Removing blood supply to the fibroids (uterine artery embolization).  If fibroids interfere with your fertility and you want to become pregnant, your health care provider may recommend having the fibroids removed. Follow these instructions at home:  Keep all follow-up visits as directed by your health care provider. This is important.  Take over-the-counter and prescription medicines only as told by your health care provider. ? If you were prescribed a hormone treatment, take the hormone medicines exactly as directed.  Ask your health care provider about taking iron pills and increasing the amount of dark green, leafy vegetables in your diet. These actions can help to boost your blood iron levels, which may be affected by heavy menstrual bleeding.  Pay close attention to your period and tell your health care provider about any changes, such as: ? Increased blood flow that requires you to use more pads or tampons than usual per month. ? A change in the number of days that your period lasts per month. ? A change in symptoms that are associated with your period, such as abdominal cramping or back pain. Contact a health care provider if:  You have pelvic pain, back pain, or abdominal cramps that cannot be controlled with medicines.  You have an increase in bleeding between and during periods.  You soak tampons or pads in a half hour or less.  You feel lightheaded, extra tired, or weak. Get help right away  if:  You faint.  You have a sudden increase in pelvic pain. This information is not intended to replace advice given to you by your health care provider. Make sure you discuss any questions you have with your health care provider. Document Released: 11/25/2000 Document Revised: 07/28/2016 Document Reviewed: 05/27/2014 Elsevier Interactive Patient Education  [employer].

## 2017-09-22 NOTE — Progress Notes (Signed)
Pt is here for a followup on labs and ultrasound.

## 2017-09-25 ENCOUNTER — Encounter: Payer: Managed Care, Other (non HMO) | Admitting: Certified Nurse Midwife

## 2017-10-10 ENCOUNTER — Ambulatory Visit: Payer: Self-pay | Admitting: Physician Assistant

## 2017-10-10 ENCOUNTER — Encounter: Payer: Self-pay | Admitting: Physician Assistant

## 2017-10-10 VITALS — BP 119/80 | HR 96 | Temp 98.5°F | Resp 16

## 2017-10-10 DIAGNOSIS — R519 Headache, unspecified: Secondary | ICD-10-CM

## 2017-10-10 DIAGNOSIS — R51 Headache: Secondary | ICD-10-CM

## 2017-10-10 DIAGNOSIS — F988 Other specified behavioral and emotional disorders with onset usually occurring in childhood and adolescence: Secondary | ICD-10-CM

## 2017-10-10 MED ORDER — LISDEXAMFETAMINE DIMESYLATE 30 MG PO CAPS: 30.0000 mg | ORAL_CAPSULE | Freq: Every day | ORAL | 0 refills | Status: DC

## 2017-10-10 MED ORDER — PREDNISONE 10 MG PO TABS: 30.0000 mg | ORAL_TABLET | Freq: Every day | ORAL | 0 refills | Status: DC

## 2017-10-10 NOTE — Progress Notes (Signed)
S: C/o sinus pain on r side of face for 3 days, no fever, chills, cp/sob, v/d; mucus is green and thick, cough is sporadic, c/o of facial and dental pain. Also ?if its a migraine, has been having several of these 2. ?if could go back on add medication, states she tried going off of it but is having a lot of difficulty focusing, can't complete tasks, scattered thoughts, had testing done prior to adderall use, wants to try a different type bc the adderall made her pick at her skin Using otc meds:   O: PE: vitals wnl, nad, perrl eomi, normocephalic, tms dull, nasal mucosa boggy and swollen, throat injected, neck supple no lymph, lungs c t a, cv rrr, neuro intact  A:  Sinus headache, adult add   P: drink fluids, continue regular meds , use otc meds of choice, return if not improving in 5 days, return earlier if worsening , vyvanse 30mg  qd, #30 nr, pred 30mg  qd x 3d

## 2017-10-16 ENCOUNTER — Ambulatory Visit: Payer: Managed Care, Other (non HMO) | Admitting: Family Medicine

## 2017-10-16 ENCOUNTER — Encounter: Payer: Self-pay | Admitting: Family Medicine

## 2017-10-16 VITALS — BP 118/78 | HR 88 | Temp 98.1°F | Resp 16 | Ht 67.0 in | Wt 153.0 lb

## 2017-10-16 DIAGNOSIS — E039 Hypothyroidism, unspecified: Secondary | ICD-10-CM | POA: Diagnosis not present

## 2017-10-16 DIAGNOSIS — F988 Other specified behavioral and emotional disorders with onset usually occurring in childhood and adolescence: Secondary | ICD-10-CM | POA: Diagnosis not present

## 2017-10-16 DIAGNOSIS — G43109 Migraine with aura, not intractable, without status migrainosus: Secondary | ICD-10-CM

## 2017-10-16 DIAGNOSIS — F4310 Post-traumatic stress disorder, unspecified: Secondary | ICD-10-CM

## 2017-10-16 DIAGNOSIS — M792 Neuralgia and neuritis, unspecified: Secondary | ICD-10-CM | POA: Diagnosis not present

## 2017-10-16 DIAGNOSIS — G4700 Insomnia, unspecified: Secondary | ICD-10-CM | POA: Insufficient documentation

## 2017-10-16 DIAGNOSIS — F909 Attention-deficit hyperactivity disorder, unspecified type: Secondary | ICD-10-CM | POA: Insufficient documentation

## 2017-10-16 DIAGNOSIS — G43909 Migraine, unspecified, not intractable, without status migrainosus: Secondary | ICD-10-CM | POA: Insufficient documentation

## 2017-10-16 DIAGNOSIS — F5104 Psychophysiologic insomnia: Secondary | ICD-10-CM

## 2017-10-16 DIAGNOSIS — Z7689 Persons encountering health services in other specified circumstances: Secondary | ICD-10-CM

## 2017-10-16 DIAGNOSIS — M0609 Rheumatoid arthritis without rheumatoid factor, multiple sites: Secondary | ICD-10-CM | POA: Diagnosis not present

## 2017-10-16 MED ORDER — AMPHETAMINE-DEXTROAMPHETAMINE 15 MG PO TABS: 15.0000 mg | ORAL_TABLET | Freq: Two times a day (BID) | ORAL | 0 refills | Status: DC

## 2017-10-16 MED ORDER — SUMATRIPTAN SUCCINATE 50 MG PO TABS: 50.0000 mg | ORAL_TABLET | ORAL | 0 refills | Status: DC | PRN

## 2017-10-16 MED ORDER — TRAZODONE HCL 150 MG PO TABS: 75.0000 mg | ORAL_TABLET | Freq: Every day | ORAL | 3 refills | Status: DC

## 2017-10-16 MED ORDER — MELOXICAM 15 MG PO TABS: 15.0000 mg | ORAL_TABLET | Freq: Every day | ORAL | 2 refills | Status: DC

## 2017-10-16 NOTE — Patient Instructions (Addendum)
Try Magnesium 200mg  twice daily and Vitamin B2 (Riboflavin) 200mg  twice daily for migraine prophylaxis/prevention  Talk to OB/gyn about estrogen containing birth control in setting of migraines with aura   Recurrent Migraine Headache A migraine headache is very bad, throbbing pain that is usually on one side of your head. Recurrent migraines keep coming back (recurring). Talk with your doctor about what things may bring on (trigger) your migraine headaches. Follow these instructions at home: Medicines  Take over-the-counter and prescription medicines only as told by your doctor.  Do not drive or use heavy machinery while taking prescription pain medicine. Lifestyle  Do not use any products that contain nicotine or tobacco, such as cigarettes and e-cigarettes. If you need help quitting, ask your doctor.  Limit alcohol intake to no more than 1 drink a day for nonpregnant women and 2 drinks a day for men. One drink equals 12 oz of beer, 5 oz of wine, or 1 oz of hard liquor.  Get 7-9 hours of sleep each night.  Lessen any stress in your life. Ask your doctor about ways to lower your stress.  Stay at a healthy weight. Talk with your doctor if you need help losing weight.  Get regular exercise. General instructions  Keep a journal to find out if certain things bring on migraine headaches. For example, write down: ? What you eat and drink. ? How much sleep you get. ? Any change to your diet or medicines.  Lie down in a dark, quiet room when you have a migraine.  Try placing a cool towel over your head when you have a migraine.  Keep lights dim if bright lights bother you or make your migraines worse.  Keep all follow-up visits as told by your doctor. This is important. Contact a doctor if:  Medicine does not help your migraines.  Your pain keeps coming back.  You have a fever.  You have weight loss without trying. Get help right away if:  Your migraine becomes really bad  and medicine does not help.  You have a stiff neck.  You have trouble seeing.  Your muscles are weak or you lose control of your muscles.  You lose your balance or have trouble walking.  You feel like you will pass out (faint) or you pass out.  You have really bad symptoms that are different than your first symptoms.  You start having sudden, very bad headaches that last for one second or less, like a thunderclap. Summary  A migraine headache is very bad, throbbing pain that is usually on one side of your head.  Talk with your doctor about what things may bring on (trigger) your migraine headaches.  Take over-the-counter and prescription medicines only as told by your doctor.  Lie down in a dark, quiet room when you have a migraine.  Keep a journal about what you eat and drink, how much sleep you get, and any changes to your medicines. This can help you find out if certain things make you have migraine headaches. This information is not intended to replace advice given to you by your health care provider. Make sure you discuss any questions you have with your health care provider. Document Released: 09/06/2008 Document Revised: 10/21/2016 Document Reviewed: 10/21/2016 Elsevier Interactive Patient Education  2017 [employer].
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
## 2017-10-17 NOTE — Assessment & Plan Note (Signed)
Related to PTSD Well controlled Continue trazodone

## 2017-10-17 NOTE — Assessment & Plan Note (Signed)
Followed by Theodis Shove MD, who is checking labs and prescribing cytomel

## 2017-10-17 NOTE — Assessment & Plan Note (Signed)
No symptoms present currently Advised patient to follow-up for evaluation the next time this occurs Could consider neck imaging at that time, as well as steroid burst

## 2017-10-17 NOTE — Assessment & Plan Note (Addendum)
Discussed contraindication to combined OCPs with migraine with aura Patient to discuss with OB/gyn Start Magnesium and B2 for migraine prophylaxis Imitrex for treatment of migraine Advised no other NSAIDs while taking mobic chronically F/u in 3 months and can consider other preventive medicaitons

## 2017-10-17 NOTE — Assessment & Plan Note (Signed)
Stable, well controlled D/c vyvanse as patient has not started this Continue adderall as this is working well F/u in 3 months

## 2017-10-17 NOTE — Assessment & Plan Note (Signed)
Well controlled Continue trazodone 

## 2017-10-17 NOTE — Assessment & Plan Note (Signed)
Not formally diagnosed, Rheum factor low, +ANA Can consider referral to another rheumatologist and possible DMARD, but patient is hesistant at this time Continue Mobic - discussed long-term effects

## 2017-10-18 ENCOUNTER — Telehealth: Payer: Self-pay | Admitting: Physician Assistant

## 2017-10-18 MED ORDER — FLUCONAZOLE 150 MG PO TABS
ORAL_TABLET | ORAL | 0 refills | Status: DC
Start: 2017-10-18 — End: 2017-12-28

## 2017-10-18 MED ORDER — DOXYCYCLINE HYCLATE 100 MG PO TABS: 100.0000 mg | ORAL_TABLET | Freq: Two times a day (BID) | ORAL | 0 refills | Status: DC

## 2017-10-18 NOTE — Telephone Encounter (Signed)
Patient called the office to state her sinus infection is getting worse. She would like to go on antibiotic at this time. She was seen by her PCP 2 days ago and this was not addressed. [facility] We'll also send a prescription for Diflucan as patient typically gets yeast with antibiotics

## 2017-10-18 NOTE — Telephone Encounter (Signed)
[facility] rx for doxy and [facility]luc[facility]

## 2017-10-20 ENCOUNTER — Encounter: Payer: Self-pay | Admitting: Physician Assistant

## 2017-10-20 ENCOUNTER — Ambulatory Visit: Payer: Self-pay | Admitting: Physician Assistant

## 2017-10-20 VITALS — BP 110/80 | HR 95 | Temp 98.0°F | Resp 16

## 2017-10-20 DIAGNOSIS — R0982 Postnasal drip: Secondary | ICD-10-CM

## 2017-10-20 NOTE — Progress Notes (Signed)
S: Complains of sore throat. Patient was ready placed on doxycycline for sinus infection a few days ago. She says she looked in her throat with a flashlight and has to red streaks down the back of her throat. She weren't she has something else. No fever or chills, no cough or congestion.  O: Vitals are normal, patient appears well, ears are normal, throat shows postnasal drip and cobblestoning, neck is supple, right upper cervical lymph node is swollen, lungs are clear to auscultation,cv rrr  A: post nasal drip  P: continue meds, salt water gargles

## 2017-11-06 ENCOUNTER — Other Ambulatory Visit: Payer: Self-pay

## 2017-11-06 MED ORDER — NORETHINDRONE ACET-ETHINYL EST 1-20 MG-MCG PO TABS: 1.0000 | ORAL_TABLET | Freq: Every day | ORAL | 0 refills | Status: DC

## 2017-11-14 ENCOUNTER — Encounter: Payer: Managed Care, Other (non HMO) | Admitting: Obstetrics and Gynecology

## 2017-11-16 ENCOUNTER — Other Ambulatory Visit: Payer: Self-pay | Admitting: Emergency Medicine

## 2017-11-16 MED ORDER — ALBUTEROL SULFATE HFA 108 (90 BASE) MCG/ACT IN AERS: 2.0000 | INHALATION_SPRAY | Freq: Four times a day (QID) | RESPIRATORY_TRACT | 0 refills | Status: DC | PRN

## 2017-11-22 ENCOUNTER — Ambulatory Visit: Payer: Managed Care, Other (non HMO) | Admitting: Family Medicine

## 2017-12-11 ENCOUNTER — Encounter: Payer: Managed Care, Other (non HMO) | Admitting: Certified Nurse Midwife

## 2017-12-22 ENCOUNTER — Encounter: Payer: Self-pay | Admitting: Certified Nurse Midwife

## 2017-12-22 ENCOUNTER — Ambulatory Visit (INDEPENDENT_AMBULATORY_CARE_PROVIDER_SITE_OTHER): Payer: Managed Care, Other (non HMO) | Admitting: Certified Nurse Midwife

## 2017-12-22 VITALS — BP 137/67 | HR 80 | Ht 67.0 in | Wt 148.5 lb

## 2017-12-22 DIAGNOSIS — Z124 Encounter for screening for malignant neoplasm of cervix: Secondary | ICD-10-CM | POA: Diagnosis not present

## 2017-12-22 DIAGNOSIS — Z1239 Encounter for other screening for malignant neoplasm of breast: Secondary | ICD-10-CM

## 2017-12-22 DIAGNOSIS — Z1231 Encounter for screening mammogram for malignant neoplasm of breast: Secondary | ICD-10-CM

## 2017-12-22 DIAGNOSIS — Z01419 Encounter for gynecological examination (general) (routine) without abnormal findings: Secondary | ICD-10-CM | POA: Diagnosis not present

## 2017-12-22 NOTE — Patient Instructions (Signed)
Preventive Care 40-64 Years, Female Preventive care refers to lifestyle choices and visits with your health care provider that can promote health and wellness. What does preventive care include?  A yearly physical exam. This is also called an annual well check.  Dental exams once or twice a year.  Routine eye exams. Ask your health care provider how often you should have your eyes checked.  Personal lifestyle choices, including: ? Daily care of your teeth and gums. ? Regular physical activity. ? Eating a healthy diet. ? Avoiding tobacco and drug use. ? Limiting alcohol use. ? Practicing safe sex. ? Taking low-dose aspirin daily starting at age 58. ? Taking vitamin and mineral supplements as recommended by your health care provider. What happens during an annual well check? The services and screenings done by your health care provider during your annual well check will depend on your age, overall health, lifestyle risk factors, and family history of disease. Counseling Your health care provider may ask you questions about your:  Alcohol use.  Tobacco use.  Drug use.  Emotional well-being.  Home and relationship well-being.  Sexual activity.  Eating habits.  Work and work Statistician.  Method of birth control.  Menstrual cycle.  Pregnancy history.  Screening You may have the following tests or measurements:  Height, weight, and BMI.  Blood pressure.  Lipid and cholesterol levels. These may be checked every 5 years, or more frequently if you are over 81 years old.  Skin check.  Lung cancer screening. You may have this screening every year starting at age 78 if you have a 30-pack-year history of smoking and currently smoke or have quit within the past 15 years.  Fecal occult blood test (FOBT) of the stool. You may have this test every year starting at age 65.  Flexible sigmoidoscopy or colonoscopy. You may have a sigmoidoscopy every 5 years or a colonoscopy  every 10 years starting at age 30.  Hepatitis C blood test.  Hepatitis B blood test.  Sexually transmitted disease (STD) testing.  Diabetes screening. This is done by checking your blood sugar (glucose) after you have not eaten for a while (fasting). You may have this done every 1-3 years.  Mammogram. This may be done every 1-2 years. Talk to your health care provider about when you should start having regular mammograms. This may depend on whether you have a family history of breast cancer.  BRCA-related cancer screening. This may be done if you have a family history of breast, ovarian, tubal, or peritoneal cancers.  Pelvic exam and Pap test. This may be done every 3 years starting at age 80. Starting at age 36, this may be done every 5 years if you have a Pap test in combination with an HPV test.  Bone density scan. This is done to screen for osteoporosis. You may have this scan if you are at high risk for osteoporosis.  Discuss your test results, treatment options, and if necessary, the need for more tests with your health care provider. Vaccines Your health care provider may recommend certain vaccines, such as:  Influenza vaccine. This is recommended every year.  Tetanus, diphtheria, and acellular pertussis (Tdap, Td) vaccine. You may need a Td booster every 10 years.  Varicella vaccine. You may need this if you have not been vaccinated.  Zoster vaccine. You may need this after age 5.  Measles, mumps, and rubella (MMR) vaccine. You may need at least one dose of MMR if you were born in  1957 or later. You may also need a second dose.  Pneumococcal 13-valent conjugate (PCV13) vaccine. You may need this if you have certain conditions and were not previously vaccinated.  Pneumococcal polysaccharide (PPSV23) vaccine. You may need one or two doses if you smoke cigarettes or if you have certain conditions.  Meningococcal vaccine. You may need this if you have certain  conditions.  Hepatitis A vaccine. You may need this if you have certain conditions or if you travel or work in places where you may be exposed to hepatitis A.  Hepatitis B vaccine. You may need this if you have certain conditions or if you travel or work in places where you may be exposed to hepatitis B.  Haemophilus influenzae type b (Hib) vaccine. You may need this if you have certain conditions.  Talk to your health care provider about which screenings and vaccines you need and how often you need them. This information is not intended to replace advice given to you by your health care provider. Make sure you discuss any questions you have with your health care provider. Document Released: 12/25/2015 Document Revised: 08/17/2016 Document Reviewed: 09/29/2015 Elsevier Interactive Patient Education  2018 Elsevier Inc.  

## 2017-12-24 NOTE — Progress Notes (Signed)
ANNUAL PREVENTATIVE CARE GYN  ENCOUNTER NOTE  Subjective:       [name] is a 42 y.o. [phone] female here for a routine annual gynecologic exam.    Current complaints include Intermittent pelvic pain and irregular menses. Uterine fibroid found earlier this year. Plans to follow up with Dr. Marcelline Mates to discuss surgical treatment options later this year.   Denies difficulty breathing or respiratory distress, chest pain, abdominal pain, excessive vaginal bleeding, dysuria, and leg pain or swelling.     Gynecologic History  Patient's last menstrual period was 11/29/2017.  Contraception: OCP (estrogen/progesterone)  Last Pap: 2016. Results were: normal  Last mammogram: 2017. Results were: normal  Obstetric History  OB History  Gravida Para Term Preterm AB Living  3 2 1 1 1 1   SAB TAB Ectopic Multiple Live Births  1       1    # Outcome Date GA Lbr Len/2nd Weight Sex Delivery Anes PTL Lv  3 Preterm 2004   6 lb 5 oz (2.863 kg) F CS-Unspec  Y LIV     Complications: Placenta Previa  2 Term 2002   7 lb 9 oz (3.43 kg) M Vag-Spont  N      Complications: Preeclampsia, severe, third trimester  1 SAB 2000        ND      Past Medical History:  Diagnosis Date  . ADD (attention deficit disorder)   . Bone spur    "front of spine" - effects right shoulder (sometimes)  . Deviated nasal septum   . Hypothyroidism   . PTSD (post-traumatic stress disorder)   . RA (rheumatoid arthritis) ([place]) 2016  . Rheumatoid arthritis ([place]) 12/09/2016  . Sinusitis     Past Surgical History:  Procedure Laterality Date  . CESAREAN SECTION    . DILATION AND CURETTAGE OF UTERUS     (x2)  . FRONTAL SINUS EXPLORATION Bilateral 11/19/2015   Procedure: FRONTAL SINUS EXPLORATION;  Surgeon: Margaretha Sheffield, MD;  Location: [facility];  Service: ENT;  Laterality: Bilateral;  . IMAGE GUIDED SINUS SURGERY N/A 11/19/2015   Procedure: IMAGE GUIDED SINUS SURGERY;  Surgeon: Margaretha Sheffield, MD;  Location:  [place];  Service: ENT;  Laterality: N/A;  GAVE DISK TO CE CE 09/22  . MAXILLARY ANTROSTOMY Bilateral 11/19/2015   Procedure: MAXILLARY ANTROSTOMY;  Surgeon: Margaretha Sheffield, MD;  Location: [place];  Service: ENT;  Laterality: Bilateral;  . SEPTOPLASTY N/A 11/19/2015   Procedure: SEPTOPLASTY;  Surgeon: Margaretha Sheffield, MD;  Location: [place];  Service: ENT;  Laterality: N/A;  . SPHENOIDECTOMY Bilateral 11/19/2015   Procedure: Coralee P[place];  Surgeon: Margaretha Sheffield, MD;  Location: [place];  Service: ENT;  Laterality: Bilateral;  . TONSILLECTOMY      Current Outpatient Medications on File Prior to Visit  Medication Sig Dispense Refill  . albuterol (PROVENTIL HFA;VENTOLIN HFA) 108 (90 Base) MCG/ACT inhaler Inhale 2 puffs into the lungs every 6 (six) hours as needed for wheezing or shortness of breath. 1 Inhaler 0  . amphetamine-dextroamphetamine (ADDERALL) 15 MG tablet Take 1 tablet 2 (two) times daily by mouth. 60 tablet 0  . liothyronine (CYTOMEL) 5 MCG tablet Take 25 mcg by mouth daily.     . meloxicam (MOBIC) 15 MG tablet Take 1 tablet (15 mg total) daily by mouth. 90 tablet 2  . norethindrone-ethinyl estradiol (MICROGESTIN,JUNEL,LOESTRIN) 1-20 MG-MCG tablet Take 1 tablet by mouth daily. 1 Package 0  . SUMAtriptan (IMITREX) 50 MG tablet  Take 1 tablet (50 mg total) every 2 (two) hours as needed by mouth for migraine. May repeat in 2 hours if headache persists or recurs. 10 tablet 0  . traZODone (DESYREL) 150 MG tablet Take 0.5 tablets (75 mg total) at bedtime by mouth. 45 tablet 3  . amphetamine-dextroamphetamine (ADDERALL) 15 MG tablet Take 1 tablet 2 (two) times daily by mouth. (Patient not taking: Reported on 12/22/2017) 60 tablet 0  . amphetamine-dextroamphetamine (ADDERALL) 15 MG tablet Take 1 tablet 2 (two) times daily by mouth. (Patient not taking: Reported on 12/22/2017) 60 tablet 0  . doxycycline (VIBRA-TABS) 100 MG tablet Take 1 tablet (100 mg  total) 2 (two) times daily by mouth. (Patient not taking: Reported on 12/22/2017) 20 tablet 0  . fluconazole (DIFLUCAN) 150 MG tablet Take one now and one in a week (Patient not taking: Reported on 12/22/2017) 2 tablet 0   No current facility-administered medications on file prior to visit.     Allergies  Allergen Reactions  . Penicillins Swelling    Pt states caused throat to swell   . Cefdinir Palpitations  . Sulfa Antibiotics Other (See Comments)    Lips tingled    Social History   Socioeconomic History  . Marital status: Divorced    Spouse name: Not on file  . Number of children: 2  . Years of education: Not on file  . Highest education level: Not on file  Social Needs  . Financial resource strain: Not on file  . Food insecurity - worry: Not on file  . Food insecurity - inability: Not on file  . Transportation needs - medical: Not on file  . Transportation needs - non-medical: Not on file  Occupational History    Employer: [employer]   Tobacco Use  . Smoking status: Never Smoker  . Smokeless tobacco: Never Used  Substance and Sexual Activity  . Alcohol use: Yes    Alcohol/week: 0.0 oz    Comment: rare- wine twice a month  . Drug use: No  . Sexual activity: Yes    Birth control/protection: Pill  Other Topics Concern  . Not on file  Social History Narrative  . Not on file    Family History  Problem Relation Age of Onset  . Rheum arthritis Mother   . Cancer Paternal Grandfather   . Cancer Cousin   . Vascular Disease Father   . Alcohol abuse Brother   . Allergic Disorder Brother   . Drug abuse Brother     The following portions of the patient's history were reviewed and updated as appropriate: allergies, current medications, past family history, past medical history, past social history, past surgical history and problem list.  Review of Systems  ROS negative except as noted above. Information obtained for patient.   Objective:   BP  137/67   Pulse 80   Ht 5\' 7"  (1.702 m)   Wt 148 lb 8 oz (67.4 kg)   LMP 11/29/2017   BMI 23.26 kg/m   CONSTITUTIONAL: Well-developed, well-nourished female in no acute distress.   PSYCHIATRIC: Normal mood and affect. Normal behavior. Normal judgment and thought content.  [place]: Alert and oriented to person, place, and time. Normal muscle tone coordination. No cranial nerve deficit noted.  HENT:  Normocephalic, atraumatic, External right and left ear normal. Oropharynx is clear and moist  EYES: Conjunctivae and EOM are normal. Pupils are  equal, round, and reactive to light. No scleral icterus.   NECK: Normal range of  motion, supple, no masses.   Normal thyroid.   SKIN: Skin is warm and dry. No rash noted. Not diaphoretic. No erythema. No pallor.  CARDIOVASCULAR: Normal heart rate noted, regular  rhythm, no murmur.  RESPIRATORY: Clear to auscultation bilaterally. Effort and breath sounds normal, no problems with respiration noted.  BREASTS: Symmetric in size. No masses, skin changes, nipple drainage, or lymphadenopathy.  ABDOMEN: Soft, normal bowel sounds, no distention noted.  No tenderness, rebound or guarding.   PELVIC:  External Genitalia: Normal  Vagina: Normal  Cervix: Normal  Uterus: Normal  Adnexa: Normal  MUSCULOSKELETAL: Normal range of motion. No tenderness.  No cyanosis, clubbing, or edema.  2+ distal pulses.  LYMPHATIC: No Axillary, Supraclavicular, or Inguinal Adenopathy.  Assessment:   Annual gynecologic examination 42 y.o.  Contraception: OCP (estrogen/progesterone)   Normal BMI   Problem List Items Addressed This Visit    None    Visit Diagnoses    Well woman exam with routine gynecological exam    -  Primary   Relevant Orders   PapIG, HPV, rfx 16/18   Screening for cervical cancer       Relevant Orders   PapIG, HPV, rfx 16/18   Screening for breast cancer       Relevant Orders   MM DIGITAL SCREENING BILATERAL      Plan:   Pap:  Pap Co Test.  Mammogram: Ordered/  Labs: Not needed.   Routine preventative health maintenance measures emphasized: Exercise/Diet/Weight control, Tobacco Warnings, Alcohol/Substance use risks, Stress Management and Safe Sex.  Reviewed red flag symptoms and when to call.   RTC x 1 year for Annual Exam or sooner if needed.   Diona Fanti, CNM [facility]

## 2017-12-26 LAB — PAPIG, HPV, RFX 16/18
HPV, high-risk: NEGATIVE
PAP Smear Comment: 0

## 2017-12-28 ENCOUNTER — Ambulatory Visit: Payer: Self-pay | Admitting: Medical

## 2017-12-28 VITALS — BP 130/80 | HR 109 | Temp 98.2°F | Resp 16

## 2017-12-28 DIAGNOSIS — B373 Candidiasis of vulva and vagina: Secondary | ICD-10-CM

## 2017-12-28 DIAGNOSIS — J019 Acute sinusitis, unspecified: Secondary | ICD-10-CM

## 2017-12-28 DIAGNOSIS — B3731 Acute candidiasis of vulva and vagina: Secondary | ICD-10-CM

## 2017-12-28 MED ORDER — DOXYCYCLINE HYCLATE 100 MG PO TABS: 100.0000 mg | ORAL_TABLET | Freq: Two times a day (BID) | ORAL | 0 refills | Status: DC

## 2017-12-28 MED ORDER — FLUCONAZOLE 150 MG PO TABS: 150.0000 mg | ORAL_TABLET | Freq: Once | ORAL | 0 refills | Status: AC

## 2017-12-28 NOTE — Patient Instructions (Signed)

## 2017-12-28 NOTE — Progress Notes (Signed)
   Subjective:    Patient ID: [name], female    DOB: 01-25-1976, 42 y.o.   MRN: [id number]  HPI  42 yo female in non acute distress with symptoms that started 2 weeks ago progressively worse . Initially started as a cold but now seems like a bacterial infection. Children were sick with colds. History of sinusitis and then had surgery in Dec 2016 septoplasty and infections have been better. Having green nasal discharge, foreehead pain and pressure feels it is is ont he left side. Hot at night but not denies  fever or chills. Cough from post nasal drip feels phelgm come up but does not know color. Just "swallows it". Z paks do not work for her had been on them many times before nasal surgery.Also would like diflucan because she gets yeast infections with antibiotics. Using OTC Nyquil and generic [facility] cold treatment during the day , does not recall name. Takes Zyrtec during the spring for seasonal allergies.  Allergies  Allergen Reactions  . Penicillins Swelling    Pt states caused throat to swell   . Cefdinir Palpitations  . Sulfa Antibiotics Other (See Comments)    Lips tingled    Review of Systems  Constitutional: Negative for chills and fever.  HENT: Positive for congestion, postnasal drip, sinus pressure and sinus pain.   Respiratory: Positive for cough. Negative for shortness of breath.   Cardiovascular: Negative for chest pain.       Objective:   Physical Exam  Constitutional: She is oriented to person, place, and time. She appears well-developed and well-nourished.  HENT:  Head: Normocephalic.  Right Ear: Hearing, external ear and ear canal normal. A middle ear effusion is present.  Left Ear: Hearing, external ear and ear canal normal. A middle ear effusion is present.  Nose: Mucosal edema and rhinorrhea present.  Mouth/Throat: Uvula is midline, oropharynx is clear and moist and mucous membranes are normal.  Cardiovascular: Regular rhythm and normal heart sounds.  Tachycardia present.  Pulmonary/Chest: Effort normal and breath sounds normal. No respiratory distress. She has no wheezes. She has no rales.  Neurological: She is alert and oriented to person, place, and time.  Skin: Skin is warm and dry.  Psychiatric: She has a normal mood and affect. Her behavior is normal. Judgment and thought content normal.  Nursing note and vitals reviewed.   Turbinate edema on the left side with erythema      Assessment & Plan:  Sinusitis vulvo candidiasis with antibiotics Meds ordered this encounter  Medications  . doxycycline (VIBRA-TABS) 100 MG tablet    Sig: Take 1 tablet (100 mg total) by mouth 2 (two) times daily.    Dispense:  20 tablet    Refill:  0  . fluconazole (DIFLUCAN) 150 MG tablet    Sig: Take 1 tablet (150 mg total) by mouth once for 1 dose.    Dispense:  1 tablet    Refill:  0  return to clinic in 3 -5 days if not improving.  Patient verbalizes understanding and has no questions at discharge.

## 2017-12-29 ENCOUNTER — Telehealth: Payer: Self-pay | Admitting: *Deleted

## 2017-12-29 MED ORDER — FLUCONAZOLE 150 MG PO TABS: 150.0000 mg | ORAL_TABLET | ORAL | 1 refills | Status: DC

## 2017-12-29 NOTE — Telephone Encounter (Signed)
Pt is on a 10 day ATB for ear infection. Erx diflucan with 1 rf. Pt aware if no better in 10 days to contact office.

## 2017-12-29 NOTE — Telephone Encounter (Signed)
Patient called and states that [place] prescribed her an antibiotic patient states she has an yeast infection. She would like Selinda Eon to call her in a RX for Diflucan. Patient pharmacy is [facility] [address]. Please advise,. Thank you

## 2018-01-02 ENCOUNTER — Telehealth: Payer: Self-pay | Admitting: *Deleted

## 2018-01-02 MED ORDER — NORETHINDRONE ACET-ETHINYL EST 1-20 MG-MCG PO TABS: 1.0000 | ORAL_TABLET | Freq: Every day | ORAL | 3 refills | Status: DC

## 2018-01-02 NOTE — Telephone Encounter (Signed)
Pt request refill of ocp. Erx.

## 2018-01-02 NOTE — Telephone Encounter (Signed)
Patient called and states that she needs a refill of her Microgestin. Patient pharmacy is [facility] [address]. She also has a questions about her birth control before it is sent to the pharmacy .  Patient is requesting a call back. Her contact # is  [phone]. Please advise. Thank you

## 2018-01-12 ENCOUNTER — Ambulatory Visit
Admission: RE | Admit: 2018-01-12 | Discharge: 2018-01-12 | Disposition: A | Discharge status: Home / Self Care | Payer: Managed Care, Other (non HMO) | Source: Ambulatory Visit | Attending: Certified Nurse Midwife | Admitting: Certified Nurse Midwife

## 2018-01-12 DIAGNOSIS — Z1231 Encounter for screening mammogram for malignant neoplasm of breast: Secondary | ICD-10-CM | POA: Insufficient documentation

## 2018-01-12 DIAGNOSIS — Z1239 Encounter for other screening for malignant neoplasm of breast: Secondary | ICD-10-CM

## 2018-01-16 ENCOUNTER — Ambulatory Visit: Payer: Self-pay | Admitting: Family Medicine

## 2018-01-16 ENCOUNTER — Encounter: Payer: Self-pay | Admitting: Family Medicine

## 2018-01-16 VITALS — BP 116/62 | HR 90 | Temp 98.4°F | Resp 16

## 2018-01-16 DIAGNOSIS — H6983 Other specified disorders of Eustachian tube, bilateral: Secondary | ICD-10-CM

## 2018-01-16 DIAGNOSIS — J019 Acute sinusitis, unspecified: Secondary | ICD-10-CM

## 2018-01-16 DIAGNOSIS — T162XXA Foreign body in left ear, initial encounter: Secondary | ICD-10-CM

## 2018-01-16 MED ORDER — CLARITHROMYCIN ER 500 MG PO TB24: ORAL_TABLET | ORAL | 0 refills | Status: DC

## 2018-01-16 MED ORDER — FLUTICASONE PROPIONATE 50 MCG/ACT NA SUSP: 2.0000 | Freq: Every day | NASAL | 1 refills | Status: DC

## 2018-01-16 MED ORDER — PREDNISONE 20 MG PO TABS: ORAL_TABLET | ORAL | 0 refills | Status: DC

## 2018-01-16 MED ORDER — FLUCONAZOLE 150 MG PO TABS: ORAL_TABLET | ORAL | 0 refills | Status: DC

## 2018-01-16 NOTE — Progress Notes (Signed)
Patient ID: [name], female    DOB: [birth year]  Age: 42 y.o. MRN: [id number]  Chief Complaint  Patient presents with  . Sinusitis    Subjective:   Patient is here complaining of having been recently treated for sinuses.  Now she is got a stuffiness in both ears and a crackling in them.  She has felt some moisture in her left ear.  She is tender beneath the ears at the angle of the jaw.  She still has some persistent sinus congestion and infection despite the course of doxycycline.  She is allergic to penicillins, cephalosporins, and sulfa.  He has taken macrolides in the past.  Current allergies, medications, problem list, past/family and social histories reviewed.  Objective:  BP 116/62   Pulse 90   Temp 98.4 F (36.9 C) (Oral)   Resp 16   LMP 01/05/2018   SpO2 99%   No major distress.  TMs normal.  She has a hair midway down the left ear canal, a dark hair plastered to the wall of the canal.  Her nose is a little congested she is tender on the frontal and maxillary sinuses.  Throat was clear.  Neck supple without significant nodes but she is tender at the angle of the jaws.  Chest is clear to auscultation.  Heart regular without murmurs.  Assessment & Plan:   Assessment: 1. Dysfunction of both eustachian tubes   2. Acute rhinosinusitis   3. Foreign body of left ear, initial encounter       Plan: See instructions  No orders of the defined types were placed in this encounter.   Meds ordered this encounter  Medications  . fluconazole (DIFLUCAN) 150 MG tablet    Sig: Take 1 tablet if needed for yeast infection.  May repeat in 3 days if necessary.    Dispense:  2 tablet    Refill:  0  . clarithromycin (BIAXIN XL) 500 MG 24 hr tablet    Sig: Take 1 to tablet daily at breakfast and supper.    Dispense:  14 tablet    Refill:  0  . fluticasone (FLONASE) 50 MCG/ACT nasal spray    Sig: Place 2 sprays into both nostrils daily.    Dispense:  16 g    Refill:  1          Patient Instructions  Continue to drink plenty of water to stay well-hydrated.  That will keep secretions thinner.  Take the prednisone 20 mg 3 pills daily for 2 days, then 2 daily for 2 days, then 1 daily for 2 days for sinuses and eustachian tube  Take the fluticasone nose spray (Flonase) 2 sprays each nostril twice daily for 3 or 4 days, then once daily  Take clarithromycin 500 mg 1 twice daily with food.  (You can take 2 pills once daily but I think the side effects are less spreading it out)  Tylenol or ibuprofen as needed  Use some Debrox per directions on the container to irrigate your left ear.  You have a single dark hair in that ear.  Return as needed   Eustachian Tube Dysfunction The eustachian tube connects the middle ear to the back of the nose. It regulates air pressure in the middle ear by allowing air to move between the ear and nose. It also helps to drain fluid from the middle ear space. When the eustachian tube does not function properly, air pressure, fluid, or both can build up in the  middle ear. Eustachian tube dysfunction can affect one or both ears. What are the causes? This condition happens when the eustachian tube becomes blocked or cannot open normally. This may result from:  Ear infections.  Colds and other upper respiratory infections.  Allergies.  Irritation, such as from cigarette smoke or acid from the stomach coming up into the esophagus (gastroesophageal reflux).  Sudden changes in air pressure, such as from descending in an airplane.  Abnormal growths in the nose or throat, such as nasal polyps, tumors, or enlarged tissue at the back of the throat (adenoids).  What increases the risk? This condition may be more likely to develop in people who smoke and people who are overweight. Eustachian tube dysfunction may also be more likely to develop in children, especially children who have:  Certain birth defects of the mouth, such as  cleft palate.  Large tonsils and adenoids.  What are the signs or symptoms? Symptoms of this condition may include:  A feeling of fullness in the ear.  Ear pain.  Clicking or popping noises in the ear.  Ringing in the ear.  Hearing loss.  Loss of balance.  Symptoms may get worse when the air pressure around you changes, such as when you travel to an area of high elevation or fly on an airplane. How is this diagnosed? This condition may be diagnosed based on:  Your symptoms.  A physical exam of your ear, nose, and throat.  Tests, such as those that measure: ? The movement of your eardrum (tympanogram). ? Your hearing (audiometry).  How is this treated? Treatment depends on the cause and severity of your condition. If your symptoms are mild, you may be able to relieve your symptoms by moving air into ("popping") your ears. If you have symptoms of fluid in your ears, treatment may include:  Decongestants.  Antihistamines.  Nasal sprays or ear drops that contain medicines that reduce swelling (steroids).  In some cases, you may need to have a procedure to drain the fluid in your eardrum (myringotomy). In this procedure, a small tube is placed in the eardrum to:  Drain the fluid.  Restore the air in the middle ear space.  Follow these instructions at home:  Take over-the-counter and prescription medicines only as told by your health care provider.  Use techniques to help pop your ears as recommended by your health care provider. These may include: ? Chewing gum. ? Yawning. ? Frequent, forceful swallowing. ? Closing your mouth, holding your nose closed, and gently blowing as if you are trying to blow air out of your nose.  Do not do any of the following until your health care provider approves: ? Travel to high altitudes. ? Fly in airplanes. ? Work in a Pension scheme manager or room. ? Scuba dive.  Keep your ears dry. Dry your ears completely after showering or  bathing.  Do not smoke.  Keep all follow-up visits as told by your health care provider. This is important. Contact a health care provider if:  Your symptoms do not go away after treatment.  Your symptoms come back after treatment.  You are unable to pop your ears.  You have: ? A fever. ? Pain in your ear. ? Pain in your head or neck. ? Fluid draining from your ear.  Your hearing suddenly changes.  You become very dizzy.  You lose your balance. This information is not intended to replace advice given to you by your health care provider. Make sure  you discuss any questions you have with your health care provider. Document Released: 12/25/2015 Document Revised: 05/05/2016 Document Reviewed: 12/17/2014 Elsevier Interactive Patient Education  2018 [employer].     No Follow-up on file.   ,, MD 01/16/2018

## 2018-01-16 NOTE — Patient Instructions (Addendum)
Continue to drink plenty of water to stay well-hydrated.  That will keep secretions thinner.  Take the prednisone 20 mg 3 pills daily for 2 days, then 2 daily for 2 days, then 1 daily for 2 days for sinuses and eustachian tube  Take the fluticasone nose spray (Flonase) 2 sprays each nostril twice daily for 3 or 4 days, then once daily  Take clarithromycin 500 mg 1 twice daily with food.  (You can take 2 pills once daily but I think the side effects are less spreading it out)  Tylenol or ibuprofen as needed  Use some Debrox per directions on the container to irrigate your left ear.  You have a single dark hair in that ear.  Return as needed   Eustachian Tube Dysfunction The eustachian tube connects the middle ear to the back of the nose. It regulates air pressure in the middle ear by allowing air to move between the ear and nose. It also helps to drain fluid from the middle ear space. When the eustachian tube does not function properly, air pressure, fluid, or both can build up in the middle ear. Eustachian tube dysfunction can affect one or both ears. What are the causes? This condition happens when the eustachian tube becomes blocked or cannot open normally. This may result from:  Ear infections.  Colds and other upper respiratory infections.  Allergies.  Irritation, such as from cigarette smoke or acid from the stomach coming up into the esophagus (gastroesophageal reflux).  Sudden changes in air pressure, such as from descending in an airplane.  Abnormal growths in the nose or throat, such as nasal polyps, tumors, or enlarged tissue at the back of the throat (adenoids).  What increases the risk? This condition may be more likely to develop in people who smoke and people who are overweight. Eustachian tube dysfunction may also be more likely to develop in children, especially children who have:  Certain birth defects of the mouth, such as cleft palate.  Large tonsils and  adenoids.  What are the signs or symptoms? Symptoms of this condition may include:  A feeling of fullness in the ear.  Ear pain.  Clicking or popping noises in the ear.  Ringing in the ear.  Hearing loss.  Loss of balance.  Symptoms may get worse when the air pressure around you changes, such as when you travel to an area of high elevation or fly on an airplane. How is this diagnosed? This condition may be diagnosed based on:  Your symptoms.  A physical exam of your ear, nose, and throat.  Tests, such as those that measure: ? The movement of your eardrum (tympanogram). ? Your hearing (audiometry).  How is this treated? Treatment depends on the cause and severity of your condition. If your symptoms are mild, you may be able to relieve your symptoms by moving air into ("popping") your ears. If you have symptoms of fluid in your ears, treatment may include:  Decongestants.  Antihistamines.  Nasal sprays or ear drops that contain medicines that reduce swelling (steroids).  In some cases, you may need to have a procedure to drain the fluid in your eardrum (myringotomy). In this procedure, a small tube is placed in the eardrum to:  Drain the fluid.  Restore the air in the middle ear space.  Follow these instructions at home:  Take over-the-counter and prescription medicines only as told by your health care provider.  Use techniques to help pop your ears as recommended by  your health care provider. These may include: ? Chewing gum. ? Yawning. ? Frequent, forceful swallowing. ? Closing your mouth, holding your nose closed, and gently blowing as if you are trying to blow air out of your nose.  Do not do any of the following until your health care provider approves: ? Travel to high altitudes. ? Fly in airplanes. ? Work in a Pension scheme manager or room. ? Scuba dive.  Keep your ears dry. Dry your ears completely after showering or bathing.  Do not smoke.  Keep all  follow-up visits as told by your health care provider. This is important. Contact a health care provider if:  Your symptoms do not go away after treatment.  Your symptoms come back after treatment.  You are unable to pop your ears.  You have: ? A fever. ? Pain in your ear. ? Pain in your head or neck. ? Fluid draining from your ear.  Your hearing suddenly changes.  You become very dizzy.  You lose your balance. This information is not intended to replace advice given to you by your health care provider. Make sure you discuss any questions you have with your health care provider. Document Released: 12/25/2015 Document Revised: 05/05/2016 Document Reviewed: 12/17/2014 Elsevier Interactive Patient Education  [employer].

## 2018-01-18 ENCOUNTER — Inpatient Hospital Stay
Admission: RE | Admit: 2018-01-18 | Discharge: 2018-01-18 | Disposition: A | Discharge status: Home / Self Care | Payer: Self-pay | Source: Ambulatory Visit | Attending: *Deleted | Admitting: *Deleted

## 2018-01-18 ENCOUNTER — Other Ambulatory Visit: Payer: Self-pay | Admitting: *Deleted

## 2018-01-18 DIAGNOSIS — Z9289 Personal history of other medical treatment: Secondary | ICD-10-CM

## 2018-01-22 ENCOUNTER — Other Ambulatory Visit: Payer: Self-pay

## 2018-01-22 ENCOUNTER — Encounter: Payer: Self-pay | Admitting: Family Medicine

## 2018-01-22 ENCOUNTER — Ambulatory Visit: Payer: Managed Care, Other (non HMO) | Admitting: Family Medicine

## 2018-01-22 VITALS — BP 130/84 | HR 85 | Temp 98.2°F | Resp 16 | Wt 143.0 lb

## 2018-01-22 DIAGNOSIS — F988 Other specified behavioral and emotional disorders with onset usually occurring in childhood and adolescence: Secondary | ICD-10-CM | POA: Diagnosis not present

## 2018-01-22 DIAGNOSIS — M2550 Pain in unspecified joint: Secondary | ICD-10-CM

## 2018-01-22 DIAGNOSIS — R682 Dry mouth, unspecified: Secondary | ICD-10-CM | POA: Diagnosis not present

## 2018-01-22 DIAGNOSIS — G43109 Migraine with aura, not intractable, without status migrainosus: Secondary | ICD-10-CM

## 2018-01-22 MED ORDER — AMPHETAMINE-DEXTROAMPHETAMINE 15 MG PO TABS: 15.0000 mg | ORAL_TABLET | Freq: Two times a day (BID) | ORAL | 0 refills | Status: DC

## 2018-01-22 MED ORDER — TRAZODONE HCL 150 MG PO TABS: 75.0000 mg | ORAL_TABLET | Freq: Every day | ORAL | 11 refills | Status: DC

## 2018-01-22 MED ORDER — AMPHETAMINE-DEXTROAMPHETAMINE 15 MG PO TABS
15.0000 mg | ORAL_TABLET | Freq: Two times a day (BID) | ORAL | 0 refills | Status: DC
Start: 2018-01-22 — End: 2018-02-19

## 2018-01-22 MED ORDER — MELOXICAM 15 MG PO TABS: 15.0000 mg | ORAL_TABLET | Freq: Every day | ORAL | 11 refills | Status: DC

## 2018-01-22 NOTE — Patient Instructions (Signed)
Recurrent Migraine Headache °A migraine headache is very bad, throbbing pain that is usually on one side of your head. Recurrent migraines keep coming back (recurring). Talk with your doctor about what things may bring on (trigger) your migraine headaches. °Follow these instructions at home: °Medicines  °· Take over-the-counter and prescription medicines only as told by your doctor. °· Do not drive or use heavy machinery while taking prescription pain medicine. °Lifestyle  °· Do not use any products that contain nicotine or tobacco, such as cigarettes and e-cigarettes. If you need help quitting, ask your doctor. °· Limit alcohol intake to no more than 1 drink a day for nonpregnant women and 2 drinks a day for men. One drink equals 12 oz of beer, 5 oz of wine, or 1½ oz of hard liquor. °· Get 7-9 hours of sleep each night. °· Lessen any stress in your life. Ask your doctor about ways to lower your stress. °· Stay at a healthy weight. Talk with your doctor if you need help losing weight. °· Get regular exercise. °General instructions  °· Keep a journal to find out if certain things bring on migraine headaches. For example, write down: °¨ What you eat and drink. °¨ How much sleep you get. °¨ Any change to your diet or medicines. °· Lie down in a dark, quiet room when you have a migraine. °· Try placing a cool towel over your head when you have a migraine. °· Keep lights dim if bright lights bother you or make your migraines worse. °· Keep all follow-up visits as told by your doctor. This is important. °Contact a doctor if: °· Medicine does not help your migraines. °· Your pain keeps coming back. °· You have a fever. °· You have weight loss without trying. °Get help right away if: °· Your migraine becomes really bad and medicine does not help. °· You have a stiff neck. °· You have trouble seeing. °· Your muscles are weak or you lose control of your muscles. °· You lose your balance or have trouble walking. °· You feel  like you will pass out (faint) or you pass out. °· You have really bad symptoms that are different than your first symptoms. °· You start having sudden, very bad headaches that last for one second or less, like a thunderclap. °Summary °· A migraine headache is very bad, throbbing pain that is usually on one side of your head. °· Talk with your doctor about what things may bring on (trigger) your migraine headaches. °· Take over-the-counter and prescription medicines only as told by your doctor. °· Lie down in a dark, quiet room when you have a migraine. °· Keep a journal about what you eat and drink, how much sleep you get, and any changes to your medicines. This can help you find out if certain things make you have migraine headaches. °This information is not intended to replace advice given to you by your health care provider. Make sure you discuss any questions you have with your health care provider. °Document Released: 09/06/2008 Document Revised: 10/21/2016 Document Reviewed: 10/21/2016 °Elsevier Interactive Patient Education © 2017 [employer] Inc. ° °

## 2018-01-22 NOTE — Progress Notes (Signed)
Patient: [name] Female    DOB: Jan 08, 1976   42 y.o.   MRN: [id number] Visit Date: 01/23/2018  Today's Provider: Lavon Paganini, MD   I, Martha Clan, CMA, am acting as scribe for Lavon Paganini, MD.  Chief Complaint  Patient presents with  . Migraine  . ADD   Subjective:    HPI     Follow up for Migraines  The patient was last seen for this 3 months ago. Changes made at last visit include starting magnesium and B2 for migraine prophylaxis; Imitrex for treatment of migraines.  She has not started her magnesium or B2; would like to discuss recent lab results prior to starting supplements.   She states she had a migraine from 01/04/2018-01/05/2018, and took Imitrex. This dulled the pain, but had a lingering headache that lasted until 01/10/2018.  ------------------------------------------------------------------------------------   Follow up for ADD  The patient was last seen for this 3 months ago. Changes made at last visit include continuing Adderall.  She reports good compliance with treatment. She feels that condition is Improved. She is not having side effects.   ------------------------------------------------------------------------------------  Pt would like to discuss previous lab results. Positive ANA; negative lupus, negative hepatitis. RDW's low and MCV's high. H/O hypothyroidism (treated with cytomel, normal TSH and free T4, high reverse T3, treated by [facility] in [place]), and states this "goes hand in hand with autoimmune disorder". Vit B12 and folate levels normal.  She is questioning what autoimmune disorder she could have. She states she arthralgias, dry mouth, dry eyes, dry skin (as well as constipation; last thyroid panel checked 2 weeks ago). Her ferritin was also low; is experiencing fatigue and malaise.  Strong family history in mother, MGM, MGGM of RA.  No rash, b/l thumb MCPs swelling, pain, catching.  Mobic helps this.  Morning  stiffness of joints and after sitting for long periods of time.  She is also concerned about cold fingertips and fingertip discoloration. Fingers and toes will be purple, white.  Also concerned about chronic dry mouth and dry eyes.  Wonders if this could be related to an autoimmune disease as well.  Allergies  Allergen Reactions  . Penicillins Swelling    Pt states caused throat to swell   . Cefdinir Palpitations  . Sulfa Antibiotics Other (See Comments)    Lips tingled     Current Outpatient Medications:  .  albuterol (PROVENTIL HFA;VENTOLIN HFA) 108 (90 Base) MCG/ACT inhaler, Inhale 2 puffs into the lungs every 6 (six) hours as needed for wheezing or shortness of breath., Disp: 1 Inhaler, Rfl: 0 .  amphetamine-dextroamphetamine (ADDERALL) 15 MG tablet, Take 1 tablet by mouth 2 (two) times daily., Disp: 60 tablet, Rfl: 0 .  fluticasone (FLONASE) 50 MCG/ACT nasal spray, Place 2 sprays into both nostrils daily., Disp: 16 g, Rfl: 1 .  liothyronine (CYTOMEL) 5 MCG tablet, Take 25 mcg by mouth daily. , Disp: , Rfl:  .  meloxicam (MOBIC) 15 MG tablet, Take 1 tablet (15 mg total) by mouth daily., Disp: 30 tablet, Rfl: 11 .  norethindrone-ethinyl estradiol (MICROGESTIN,JUNEL,LOESTRIN) 1-20 MG-MCG tablet, Take 1 tablet by mouth daily. 28 day supply--NOT 21 DAY, Disp: 3 Package, Rfl: 3 .  SUMAtriptan (IMITREX) 50 MG tablet, Take 1 tablet (50 mg total) every 2 (two) hours as needed by mouth for migraine. May repeat in 2 hours if headache persists or recurs., Disp: 10 tablet, Rfl: 0 .  traZODone (DESYREL) 150 MG tablet, Take 0.5 tablets (  75 mg total) by mouth at bedtime., Disp: 45 tablet, Rfl: 11 .  amphetamine-dextroamphetamine (ADDERALL) 15 MG tablet, Take 1 tablet by mouth 2 (two) times daily., Disp: 60 tablet, Rfl: 0 .  amphetamine-dextroamphetamine (ADDERALL) 15 MG tablet, Take 1 tablet by mouth 2 (two) times daily., Disp: 60 tablet, Rfl: 0  Review of Systems  Constitutional: Positive for  appetite change (decreased), diaphoresis (night sweats) and fatigue (and malaise). Negative for activity change and chills.  Eyes:       Dry eye  Respiratory: Positive for chest tightness (different from asthma per pt). Negative for shortness of breath.   Cardiovascular: Positive for chest pain. Negative for palpitations and leg swelling.  Endocrine: Positive for cold intolerance.  Musculoskeletal: Positive for arthralgias.       Muscle "spasms all through body" per pt  Neurological: Positive for headaches.  Hematological: Bruises/bleeds easily.  Psychiatric/Behavioral: Negative for sleep disturbance (sleeps well on Trazodone).    Social History   Tobacco Use  . Smoking status: Never Smoker  . Smokeless tobacco: Never Used  Substance Use Topics  . Alcohol use: Yes    Alcohol/week: 0.0 oz    Comment: rare- wine twice a month   Objective:   BP 130/84 (BP Location: Left Arm, Patient Position: Sitting, Cuff Size: Normal)   Pulse 85   Temp 98.2 F (36.8 C) (Oral)   Resp 16   Wt 143 lb (64.9 kg)   LMP 01/01/2018   SpO2 98%   BMI 22.40 kg/m  Vitals:   01/22/18 1603  BP: 130/84  Pulse: 85  Resp: 16  Temp: 98.2 F (36.8 C)  TempSrc: Oral  SpO2: 98%  Weight: 143 lb (64.9 kg)     Physical Exam  Constitutional: She is oriented to person, place, and time. She appears well-developed and well-nourished. No distress.  HENT:  Head: Normocephalic and atraumatic.  Eyes: Conjunctivae are normal. No scleral icterus.  Cardiovascular: Normal rate, regular rhythm and normal heart sounds.  No murmur heard. Pulmonary/Chest: Effort normal and breath sounds normal. No respiratory distress. She has no wheezes. She has no rales.  Musculoskeletal: She exhibits no edema.  Neurological: She is alert and oriented to person, place, and time.  Skin: Skin is warm and dry. No rash noted.  Psychiatric: She has a normal mood and affect. Her behavior is normal.  Vitals reviewed.         Assessment & Plan:      Problem List Items Addressed This Visit      Cardiovascular and Mediastinum   Migraine with aura - Primary    Discussed again the contraindication to combined OCPs with migraine with aura - patient to discussed with her GYN Will try starting Magnesium and B12 again for migraine prophylaxis Can continue Imitrex for treatment F/u in 3 months and consider other preventive medications      Relevant Medications   traZODone (DESYREL) 150 MG tablet   meloxicam (MOBIC) 15 MG tablet     Digestive   Dry mouth    Could be related to her other symptoms including Raynauds, polyarthralgias, joint stiffness Could be part of a rheumatologic process Will refer to Rheumatology        Other   Polyarthralgia    Patient with +ANA, but other testing by rheum in the past has been negative (see [facility] lab results) Patient is frustrated by a lack of diagnosis, as she feels her symptoms are similar to her mother's (who has RA) Will refer to  Rheumatology - will need to send over lab results from 03/2017 from [facility] as well Continue Mobic currently      Relevant Orders   Ambulatory referral to Rheumatology   ADD (attention deficit disorder)    Stable, well controlled No medication side effects Continue adderall - refilled x3 months F/u in 3 months          Return in about 3 months (around 04/21/2018) for ADD f/u.   The entirety of the information documented in the History of Present Illness, Review of Systems and Physical Exam were personally obtained by me. Portions of this information were initially documented by Raquel Sarna Ratchford, CMA and reviewed by me for thoroughness and accuracy.    Virginia Crews, MD, MPH [facility] 01/23/2018 8:20 AM

## 2018-01-23 DIAGNOSIS — R682 Dry mouth, unspecified: Secondary | ICD-10-CM | POA: Insufficient documentation

## 2018-01-23 NOTE — Assessment & Plan Note (Signed)
Patient with +ANA, but other testing by rheum in the past has been negative (see [facility] lab results) Patient is frustrated by a lack of diagnosis, as she feels her symptoms are similar to her mother's (who has RA) Will refer to Rheumatology - will need to send over lab results from 03/2017 from [facility] as well Continue Mobic currently

## 2018-01-23 NOTE — Assessment & Plan Note (Signed)
Discussed again the contraindication to combined OCPs with migraine with aura - patient to discussed with her GYN Will try starting Magnesium and B12 again for migraine prophylaxis Can continue Imitrex for treatment F/u in 3 months and consider other preventive medications

## 2018-01-23 NOTE — Assessment & Plan Note (Signed)
Stable, well controlled No medication side effects Continue adderall - refilled x3 months F/u in 3 months

## 2018-01-23 NOTE — Assessment & Plan Note (Signed)
Could be related to her other symptoms including Raynauds, polyarthralgias, joint stiffness Could be part of a rheumatologic process Will refer to Rheumatology

## 2018-02-19 ENCOUNTER — Other Ambulatory Visit: Payer: Self-pay | Admitting: Family Medicine

## 2018-02-19 ENCOUNTER — Encounter: Payer: Self-pay | Admitting: Family Medicine

## 2018-02-19 ENCOUNTER — Ambulatory Visit: Payer: Managed Care, Other (non HMO) | Admitting: Family Medicine

## 2018-02-19 VITALS — BP 100/66 | HR 80 | Temp 97.7°F | Resp 16

## 2018-02-19 DIAGNOSIS — J0101 Acute recurrent maxillary sinusitis: Secondary | ICD-10-CM

## 2018-02-19 DIAGNOSIS — M2550 Pain in unspecified joint: Secondary | ICD-10-CM

## 2018-02-19 MED ORDER — FLUCONAZOLE 150 MG PO TABS: 150.0000 mg | ORAL_TABLET | Freq: Once | ORAL | 0 refills | Status: AC

## 2018-02-19 MED ORDER — DOXYCYCLINE HYCLATE 100 MG PO TABS: 100.0000 mg | ORAL_TABLET | Freq: Two times a day (BID) | ORAL | 0 refills | Status: DC

## 2018-02-19 NOTE — Progress Notes (Signed)
Patient: [name] Female    DOB: [birth year]   41 y.o.   MRN: [id number] Visit Date: 02/19/2018  Today's Provider: Lavon Paganini, MD   I, Martha Clan, CMA, am acting as scribe for Lavon Paganini, MD.  Chief Complaint  Patient presents with  . Sinusitis   Subjective:    Sinusitis  This is a new problem. Episode onset: x 3 weeks. The problem has been gradually worsening since onset. There has been no fever. The pain is moderate. Associated symptoms include chills, congestion, coughing (some dry cough), diaphoresis, ear pain (left ear fullness), headaches, a hoarse voice, neck pain, shortness of breath (some; H/O RAD), sinus pressure, sneezing (occasional) and swollen glands. Pertinent negatives include no sore throat.   Bad taste in mouth and smells infected. Tried sudafed, dayquil/nyquil, mucinex, nasal sprays, benadryl.  Maxillary pain b/l. Previous sinus surgery. H/o recurrent sinusitis    Allergies  Allergen Reactions  . Penicillins Swelling    Pt states caused throat to swell   . Cefdinir Palpitations  . Sulfa Antibiotics Other (See Comments)    Lips tingled     Current Outpatient Medications:  .  albuterol (PROVENTIL HFA;VENTOLIN HFA) 108 (90 Base) MCG/ACT inhaler, Inhale 2 puffs into the lungs every 6 (six) hours as needed for wheezing or shortness of breath., Disp: 1 Inhaler, Rfl: 0 .  amphetamine-dextroamphetamine (ADDERALL) 15 MG tablet, Take 1 tablet by mouth 2 (two) times daily., Disp: 60 tablet, Rfl: 0 .  fluticasone (FLONASE) 50 MCG/ACT nasal spray, Place 2 sprays into both nostrils daily., Disp: 16 g, Rfl: 1 .  liothyronine (CYTOMEL) 5 MCG tablet, Take 25 mcg by mouth daily. , Disp: , Rfl:  .  meloxicam (MOBIC) 15 MG tablet, Take 1 tablet (15 mg total) by mouth daily., Disp: 30 tablet, Rfl: 11 .  norethindrone-ethinyl estradiol (MICROGESTIN,JUNEL,LOESTRIN) 1-20 MG-MCG tablet, Take 1 tablet by mouth daily. 28 day supply--NOT 21 DAY, Disp: 3  Package, Rfl: 3 .  SUMAtriptan (IMITREX) 50 MG tablet, Take 1 tablet (50 mg total) every 2 (two) hours as needed by mouth for migraine. May repeat in 2 hours if headache persists or recurs., Disp: 10 tablet, Rfl: 0 .  traZODone (DESYREL) 150 MG tablet, Take 0.5 tablets (75 mg total) by mouth at bedtime., Disp: 45 tablet, Rfl: 11  Review of Systems  Constitutional: Positive for chills and diaphoresis.  HENT: Positive for congestion, ear pain (left ear fullness), hoarse voice, sinus pressure and sneezing (occasional). Negative for sore throat.   Respiratory: Positive for cough (some dry cough) and shortness of breath (some; H/O RAD).   Musculoskeletal: Positive for neck pain.  Neurological: Positive for headaches.    Social History   Tobacco Use  . Smoking status: Never Smoker  . Smokeless tobacco: Never Used  Substance Use Topics  . Alcohol use: Yes    Alcohol/week: 0.0 oz    Comment: rare- wine twice a month   Objective:   BP 100/66 (BP Location: Left Arm, Patient Position: Sitting, Cuff Size: Normal)   Pulse 80   Temp 97.7 F (36.5 C) (Oral)   Resp 16   LMP 01/31/2018   SpO2 98%  Vitals:   02/19/18 0942  BP: 100/66  Pulse: 80  Resp: 16  Temp: 97.7 F (36.5 C)  TempSrc: Oral  SpO2: 98%     Physical Exam  Constitutional: She is oriented to person, place, and time. She appears well-developed and well-nourished. No distress.  HENT:  Head: Normocephalic and atraumatic.  Right Ear: Tympanic membrane, external ear and ear canal normal.  Left Ear: Tympanic membrane, external ear and ear canal normal.  Nose: Mucosal edema present. Right sinus exhibits maxillary sinus tenderness. Right sinus exhibits no frontal sinus tenderness. Left sinus exhibits maxillary sinus tenderness. Left sinus exhibits no frontal sinus tenderness.  Mouth/Throat: Uvula is midline, oropharynx is clear and moist and mucous membranes are normal.  Eyes: Conjunctivae and EOM are normal. Pupils are equal,  round, and reactive to light. Right eye exhibits no discharge. Left eye exhibits no discharge. No scleral icterus.  Neck: Neck supple. No thyromegaly present.  Cardiovascular: Normal rate, regular rhythm, normal heart sounds and intact distal pulses.  No murmur heard. Pulmonary/Chest: Effort normal and breath sounds normal. No respiratory distress. She has no wheezes. She has no rales.  Musculoskeletal: She exhibits no edema.  Lymphadenopathy:    She has no cervical adenopathy.  Neurological: She is alert and oriented to person, place, and time.  Skin: Skin is warm and dry. No rash noted.  Psychiatric: She has a normal mood and affect.  Vitals reviewed.       Assessment & Plan:      1. Acute recurrent maxillary sinusitis - symptoms and exam c/w maxillary sinusitis - h/o sinus surgery for recurrent sinusitis - treat with 10d course of Doxycycline - avoid other abx due to allergies - could consider levaquin if doxycycline ineffective - symptomatic management and return precautions discussed   Meds ordered this encounter  Medications  . doxycycline (VIBRA-TABS) 100 MG tablet    Sig: Take 1 tablet (100 mg total) by mouth 2 (two) times daily.    Dispense:  20 tablet    Refill:  0  . fluconazole (DIFLUCAN) 150 MG tablet    Sig: Take 1 tablet (150 mg total) by mouth once for 1 dose.    Dispense:  2 tablet    Refill:  0     Return if symptoms worsen or fail to improve.   The entirety of the information documented in the History of Present Illness, Review of Systems and Physical Exam were personally obtained by me. Portions of this information were initially documented by Raquel Sarna Ratchford, CMA and reviewed by me for thoroughness and accuracy.    Virginia Crews, MD, MPH [facility] 02/19/2018 10:04 AM

## 2018-02-19 NOTE — Patient Instructions (Signed)

## 2018-03-29 NOTE — Progress Notes (Signed)
Office Visit Note  Patient: [name]             Date of Birth: 15-Oct-1976           MRN: [id number]             PCP: Virginia Crews, MD Referring: Virginia Crews, MD Visit Date: 04/10/2018 Occupation: Curator    Subjective:  Pain in multiple joints.   History of Present Illness: [name] is a 42 y.o. female seen in consultation per request of her PCP.  According to patient her symptoms started about 6 years ago with pain in her bilateral thumb.  She states 2 years ago the pain in her bilateral thumb got much worse her thumb started swelling, she was also experiencing nocturnal pain.  She was seen by an orthopedic doctor who did x-rays and advised Mobic which she has been taking on regular basis.  She is also noticed decreased grip strength in her hands.  Over time she has developed pain in multiple joints.  She describes pain in the base of her neck.  She has pain in her bilateral shoulders for multiple years more so in her right shoulder than the left.  She has been seen by an orthopedic doctor at [facility] who was given her to cortisone injections to her right shoulder.  She has been also having swelling in her right sternoclavicular joint for the last 2-1/2 years.  She has had 2 cortisone injections to the joint.  She also complains of intermittent discomfort in her elbows her left hip and her right knee which she describes over the lateral aspect of the knee joint.  She gives history of rainouts phenomenon for the last 3 to 4 years.  She brought some pictures on her cell phone today where she had whitish and bluish discoloration of her fingers.  She has noticed rainouts in her feet.  She recently has been experiencing pain in her bilateral wrist joints and bilateral ankles.  She also complains of muscle cramps.  She has been taking potassium and magnesium for muscle cramps.  She has noticed trigger finger in her bilateral hands which she describes mostly in  her bilateral first, second and third digits.  She complains of dry eyes and dry mouth.  She has long-standing history of TMJ.  Activities of Daily Living:  Patient reports morning stiffness for 45 minutes.   Patient Denies nocturnal pain.  Difficulty dressing/grooming: Denies Difficulty climbing stairs: Denies Difficulty getting out of chair: Denies Difficulty using hands for taps, buttons, cutlery, and/or writing: Reports   Review of Systems  Constitutional: Positive for fatigue. Negative for night sweats, weight gain and weight loss.  HENT: Positive for mouth dryness. Negative for mouth sores, trouble swallowing, trouble swallowing and nose dryness.   Eyes: Positive for dryness. Negative for pain, redness and visual disturbance.  Respiratory: Negative for cough, shortness of breath and difficulty breathing.   Cardiovascular: Negative for chest pain, palpitations, hypertension, irregular heartbeat and swelling in legs/feet.  Gastrointestinal: Positive for constipation. Negative for blood in stool and diarrhea.  Endocrine: Negative for increased urination.  Genitourinary: Positive for vaginal dryness.  Musculoskeletal: Positive for arthralgias, joint pain, joint swelling, myalgias, morning stiffness and myalgias. Negative for muscle weakness and muscle tenderness.  Skin: Positive for color change. Negative for rash, hair loss, skin tightness, ulcers and sensitivity to sunlight.  Allergic/Immunologic: Negative for susceptible to infections.  Neurological: Negative for dizziness, memory loss, night sweats  and weakness.  Hematological: Negative for swollen glands.  Psychiatric/Behavioral: Positive for depressed mood and sleep disturbance. The patient is nervous/anxious.     PMFS History:  Patient Active Problem List   Diagnosis Date Noted  . Dry mouth 01/23/2018  . Radicular pain in right arm 10/16/2017  . PTSD (post-traumatic stress disorder) 10/16/2017  . Insomnia 10/16/2017  .  Migraine with aura 10/16/2017  . Hypothyroidism   . ADD (attention deficit disorder)   . Uterine leiomyoma 09/22/2017  . 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 History of uterine fibroid 09/22/2017  . Polyarthralgia 12/09/2016    Past Medical History:  Diagnosis Date  . ADD (attention deficit disorder)   . Bone spur    "front of spine" - effects right shoulder (sometimes)  . Deviated nasal septum   . Hypothyroidism   . PTSD (post-traumatic stress disorder)   . RA (rheumatoid arthritis) ([place]) 2016  . Rheumatoid arthritis ([place]) 12/09/2016  . Sinusitis     Family History  Problem Relation Age of Onset  . Rheum arthritis Mother   . Cancer Paternal Grandfather   . Cancer Cousin   . Vascular Disease Father   . Alcohol abuse Brother        history of   . Drug abuse Brother        history of    Past Surgical History:  Procedure Laterality Date  . CESAREAN SECTION    . DILATION AND CURETTAGE OF UTERUS     (x2)  . FRONTAL SINUS EXPLORATION Bilateral 11/19/2015   Procedure: FRONTAL SINUS EXPLORATION;  Surgeon: Margaretha Sheffield, MD;  Location: [facility];  Service: ENT;  Laterality: Bilateral;  . IMAGE GUIDED SINUS SURGERY N/A 11/19/2015   Procedure: IMAGE GUIDED SINUS SURGERY;  Surgeon: Margaretha Sheffield, MD;  Location: [facility];  Service: ENT;  Laterality: N/A;  GAVE DISK TO CE CE 09/22  . MAXILLARY ANTROSTOMY Bilateral 11/19/2015   Procedure: MAXILLARY ANTROSTOMY;  Surgeon: Margaretha Sheffield, MD;  [facility];  Service: ENT;  Laterality: Bilateral;  . SEPTOPLASTY N/A 11/19/2015   Procedure: SEPTOPLASTY;  Surgeon: Margaretha Sheffield, MD;  Location: [place];  Service: ENT;  Laterality: N/A;  . SPHENOIDECTOMY Bilateral 11/19/2015   Procedure: Coralee Pesa;  Surgeon: Margaretha Sheffield, MD;  Location: [facility];  Service: ENT;  Laterality: Bilateral;  . TONSILLECTOMY     Social History   Social History Narrative  . Not on file      Objective: Vital Signs: BP 126/71 (BP Location: Left Arm, Patient Position: Sitting, Cuff Size: Normal)   Pulse 78   Resp 12   Ht '5\' 7"'  (1.702 m)   Wt 148 lb (67.1 kg)   BMI 23.18 kg/m    Physical Exam  Constitutional: She is oriented to person, place, and time. She appears well-developed and well-nourished.  HENT:  Head: Normocephalic and atraumatic.  Eyes: Conjunctivae and EOM are normal.  Neck: Normal range of motion.  Cardiovascular: Normal rate, regular rhythm, normal heart sounds and intact distal pulses.  Pulmonary/Chest: Effort normal and breath sounds normal.  Abdominal: Soft. Bowel sounds are normal.  Lymphadenopathy:    She has no cervical adenopathy.  Neurological: She is alert and oriented to person, place, and time.  Skin: Skin is warm and dry. Capillary refill takes 2 to 3 seconds.  Psychiatric: She has a normal mood and affect. Her behavior is normal.  Nursing note and vitals reviewed.    Musculoskeletal Exam: C-spine thoracic lumbar spine  good range of motion.  She had warmth and swelling of her right sternoclavicular joint.  She had good range of motion of bilateral shoulders bilateral elbows bilateral wrists joint MCPs PIPs DIPs.  She has thickening of bilateral first MCP joint.  She also has DIP PIP thickening.  Hip joints knee joints ankles MTPs PIPs were in good range of motion.  CDAI Exam: No CDAI exam completed.    Investigation: No additional findings. March 30, 2017 anticardiolipin IgG and IgM negative, beta-2 IgG and IgM negative, G6PD normal, RF negative, ESR normal, CRP normal, C3 and C4 normal, hepatitis B-, hepatitis C negative CBC Latest Ref Rng & Units 09/18/2017 06/13/2017 02/19/2016  WBC 3.4 - 10.8 x10E3/uL 5.0 4.0 4.6  Hemoglobin 11.1 - 15.9 g/dL 13.2 13.9 12.7  Hematocrit 34.0 - 46.6 % 40.6 39.7 38.7  Platelets 150 - 379 x10E3/uL 243 235 290   CMP Latest Ref Rng & Units 09/18/2017 06/13/2017 02/19/2016  Glucose 65 - 99 mg/dL 73 95 87   BUN 6 - 24 mg/dL '18 20 13  ' Creatinine 0.57 - 1.00 mg/dL 1.00 1.15(H) 1.19(H)  Sodium 134 - 144 mmol/L 144 136 139  Potassium 3.5 - 5.2 mmol/L 4.5 3.5 4.0  Chloride 96 - 106 mmol/L 105 103 101  CO2 20 - 29 mmol/L 25 24 -  Calcium 8.7 - 10.2 mg/dL 9.0 9.2 9.3  Total Protein 6.0 - 8.5 g/dL 6.0 7.3 6.7  Total Bilirubin 0.0 - 1.2 mg/dL 0.3 1.2 0.7  Alkaline Phos 39 - 117 IU/L 51 41 38(L)  AST 0 - 40 IU/L '23 23 24  ' ALT 0 - 32 IU/L '17 21 20   ' Imaging: Xr Sternum  Result Date: 04/10/2018 No sternoclavicular joint space narrowing or erosive changes were noted. Impression: Unremarkable sternoclavicular joint.  Xr Foot 2 Views Left  Result Date: 04/10/2018 First MTP, all PIP and DIP narrowing was noted.  No erosive changes were noted.  No intertarsal joint space narrowing was noted.  A small calcaneal spur was noted. Impression: These findings are consistent with osteoarthritis of the foot.  Xr Foot 2 Views Right  Result Date: 04/10/2018 First MTP, all PIP and DIP joint space narrowing was noted.  No erosive changes were noted.  Inferior and posterior calcaneal spurs were noted. Impression: These findings are consistent with osteoarthritis of the foot.  Xr Hand 2 View Left  Result Date: 04/10/2018 All PIP and DIP joint space narrowing was noted.  First MCP mild subluxation was noted.  CMC joint narrowing was noted.  No intercarpal or radiocarpal joint space narrowing was noted.  No erosive changes were noted. Impression: These findings are consistent with osteoarthritis of the hand.  Xr Hand 2 View Right  Result Date: 04/10/2018 All PIP and DIP joint space narrowing was noted.  First MCP mild subluxation was noted.  CMC joint narrowing was noted.  No intercarpal or radiocarpal joint space narrowing was noted.  No erosive changes were noted. Impression: These findings are consistent with osteoarthritis of the hand.   Speciality Comments: No specialty comments available.    Procedures:   No procedures performed Allergies: Penicillins; Cefdinir; and Sulfa antibiotics   Assessment / Plan:     Visit Diagnoses: Polyarthralgia -patient complains of pain in multiple joints with intermittent swelling.  She states the swelling has been better since she has been using Mobic.  She does have pain in her bilateral thumbs which she relates to practicing target shooting.  She complains of pain in her  bilateral shoulders, bilateral elbows, bilateral wrist joints, bilateral hands, bilateral feet.  Patient has seen Dr. Cain Sieve -, beta 2 WNL, anticardiolipin WNLG6PD WNL.   Pain of right clavicle -patient had warmth and swelling in her right clavicle.  She had cortisone injection about 1-1/74-monthago.  Plan: XR Clavicle Right.  The x-ray was unremarkable.  Bilateral hand pain -she had synovial thickening over bilateral first MCP joints.  No synovitis was noted.  She states taking Mobic has helped reduce her swelling.  Plan: XR Hand 2 View Right, XR Hand 2 View Left.  The x-rays were consistent with osteoarthritis of the hands.  Although she had bilateral first MCP narrowing which can be seen with inflammatory arthritis..  I will obtain following labs to evaluate this further.  Bilateral foot pain -she continues to have ongoing pain in her bilateral feet.  Plan: XR Foot 2 Views Right, XR Foot 2 Views Left.  The x-rays were consistent with osteoarthritis of the feet.  Raynaud's disease without gangrene-she brought several photographs where her fingers are white and blue.  We may consider adding amlodipine at follow-up visit.  Dry mouth-she continues to have sicca symptoms.  She has been using frequent sips of water.  Dry eyes-she has dry eyes.  She may use over-the-counter eyedrops.  Radicular pain in right arm-in the past which she relates to her C-spine her shoulder pain.  She is tried physical therapy in the past.  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 XR Hand 2 View Right  . XR Hand 2 View Left  . XR Foot 2 Views Right  . XR Foot 2 Views Left  . XR Sternum  . CBC with Differential/Platelet  . COMPLETE METABOLIC PANEL WITH GFR  . Urinalysis, Routine w reflex microscopic  . CK  . TSH  . Uric acid  . ANA  . Anti-scleroderma antibody  . RNP Antibody  . Anti-Smith antibody  . Sjogrens syndrome-A extractable nuclear antibody  . Sjogrens syndrome-B extractable nuclear antibody  . Anti-DNA antibody, double-stranded  . Lupus Anticoagulant Eval w/Reflex  . Cryoglobulin  . Pan-ANCA  . Cyclic citrul peptide antibody, IgG  . 14-3-3 eta Protein   No orders of the defined types were placed in this encounter.   Face-to-face time spent with patient was 50 minutes.> 50% of time was spent in counseling and coordination of care.  Follow-Up Instructions: Return for Inflammatory arthritis.   SBo Merino MD  Note - This record has been created using DEditor, commissioning  Chart creation errors have been sought, but may not always  have been located. Such creation errors do not reflect on  the standard of medical care.

## 2018-04-09 ENCOUNTER — Ambulatory Visit: Payer: Self-pay | Admitting: Rheumatology

## 2018-04-10 ENCOUNTER — Ambulatory Visit (INDEPENDENT_AMBULATORY_CARE_PROVIDER_SITE_OTHER): Payer: Managed Care, Other (non HMO)

## 2018-04-10 ENCOUNTER — Ambulatory Visit: Payer: Managed Care, Other (non HMO) | Admitting: Rheumatology

## 2018-04-10 ENCOUNTER — Ambulatory Visit (INDEPENDENT_AMBULATORY_CARE_PROVIDER_SITE_OTHER): Payer: Self-pay

## 2018-04-10 ENCOUNTER — Encounter: Payer: Self-pay | Admitting: Rheumatology

## 2018-04-10 VITALS — BP 126/71 | HR 78 | Resp 12 | Ht 67.0 in | Wt 148.0 lb

## 2018-04-10 DIAGNOSIS — I7300 Raynaud's syndrome without gangrene: Secondary | ICD-10-CM

## 2018-04-10 DIAGNOSIS — M79642 Pain in left hand: Secondary | ICD-10-CM | POA: Diagnosis not present

## 2018-04-10 DIAGNOSIS — Z8669 Personal history of other diseases of the nervous system and sense organs: Secondary | ICD-10-CM | POA: Diagnosis not present

## 2018-04-10 DIAGNOSIS — L658 Other specified nonscarring hair loss: Secondary | ICD-10-CM | POA: Diagnosis not present

## 2018-04-10 DIAGNOSIS — Z8639 Personal history of other endocrine, nutritional and metabolic disease: Secondary | ICD-10-CM

## 2018-04-10 DIAGNOSIS — D259 Leiomyoma of uterus, unspecified: Secondary | ICD-10-CM

## 2018-04-10 DIAGNOSIS — Z87898 Personal history of other specified conditions: Secondary | ICD-10-CM

## 2018-04-10 DIAGNOSIS — R682 Dry mouth, unspecified: Secondary | ICD-10-CM

## 2018-04-10 DIAGNOSIS — M79671 Pain in right foot: Secondary | ICD-10-CM | POA: Diagnosis not present

## 2018-04-10 DIAGNOSIS — M898X1 Other specified disorders of bone, shoulder: Secondary | ICD-10-CM | POA: Diagnosis not present

## 2018-04-10 DIAGNOSIS — M79672 Pain in left foot: Secondary | ICD-10-CM

## 2018-04-10 DIAGNOSIS — Z8261 Family history of arthritis: Secondary | ICD-10-CM

## 2018-04-10 DIAGNOSIS — R5383 Other fatigue: Secondary | ICD-10-CM

## 2018-04-10 DIAGNOSIS — M79641 Pain in right hand: Secondary | ICD-10-CM

## 2018-04-10 DIAGNOSIS — H04123 Dry eye syndrome of bilateral lacrimal glands: Secondary | ICD-10-CM

## 2018-04-10 DIAGNOSIS — M2550 Pain in unspecified joint: Secondary | ICD-10-CM | POA: Diagnosis not present

## 2018-04-10 DIAGNOSIS — M792 Neuralgia and neuritis, unspecified: Secondary | ICD-10-CM | POA: Diagnosis not present

## 2018-04-10 DIAGNOSIS — Z8659 Personal history of other mental and behavioral disorders: Secondary | ICD-10-CM

## 2018-04-11 ENCOUNTER — Encounter: Payer: Self-pay | Admitting: Rheumatology

## 2018-04-16 ENCOUNTER — Telehealth: Payer: Self-pay | Admitting: Rheumatology

## 2018-04-16 NOTE — Telephone Encounter (Signed)
Patient advised her lab results will be discussed at her follow up visit. Patient would like to have her appointment moved up. Patient advised currently unable to move appointment up as there is no availability. Patient advised will place on cancellation list.

## 2018-04-16 NOTE — Telephone Encounter (Signed)
Patient called requesting the results from her labwork done on 04/10/18.

## 2018-04-17 LAB — CYCLIC CITRUL PEPTIDE ANTIBODY, IGG: Cyclic Citrullin Peptide Ab: <16 UNITS

## 2018-04-17 LAB — CBC WITH DIFFERENTIAL/PLATELET
Basophils Absolute: 31 cells/uL (ref 0–200)
Basophils Relative: 0.6 %
Eosinophils Absolute: 31 cells/uL (ref 15–500)
Eosinophils Relative: 0.6 %
HCT: 38.1 % (ref 35.0–45.0)
Hemoglobin: 13.1 g/dL (ref 11.7–15.5)
Lymphs Abs: 1918 cells/uL (ref 850–3900)
MCH: 33.8 pg — ABNORMAL HIGH (ref 27.0–33.0)
MCHC: 34.4 g/dL (ref 32.0–36.0)
MCV: 98.2 fL (ref 80.0–100.0)
MPV: 11.5 fL (ref 7.5–12.5)
Monocytes Relative: 5.1 %
Neutro Abs: 2861 cells/uL (ref 1500–7800)
Neutrophils Relative %: 56.1 %
Platelets: 250 10*3/uL (ref 140–400)
RBC: 3.88 10*6/uL (ref 3.80–5.10)
RDW: 11.6 % (ref 11.0–15.0)
Total Lymphocyte: 37.6 %
WBC mixed population: 260 cells/uL (ref 200–950)
WBC: 5.1 10*3/uL (ref 3.8–10.8)

## 2018-04-17 LAB — COMPLETE METABOLIC PANEL WITH GFR
AG Ratio: 1.9 (calc) (ref 1.0–2.5)
ALT: 11 U/L (ref 6–29)
AST: 17 U/L (ref 10–30)
Albumin: 4.1 g/dL (ref 3.6–5.1)
Alkaline phosphatase (APISO): 38 U/L (ref 33–115)
BUN: 18 mg/dL (ref 7–25)
CO2: 28 mmol/L (ref 20–32)
Calcium: 8.9 mg/dL (ref 8.6–10.2)
Chloride: 103 mmol/L (ref 98–110)
Creat: 0.99 mg/dL (ref 0.50–1.10)
GFR, Est African American: 81 mL/min/{1.73_m2} (ref >=60)
GFR, Est Non African American: 70 mL/min/{1.73_m2} (ref >=60)
Globulin: 2.2 g/dL (calc) (ref 1.9–3.7)
Glucose, Bld: 92 mg/dL (ref 65–99)
Potassium: 4.2 mmol/L (ref 3.5–5.3)
Sodium: 137 mmol/L (ref 135–146)
Total Bilirubin: 1 mg/dL (ref 0.2–1.2)
Total Protein: 6.3 g/dL (ref 6.1–8.1)

## 2018-04-17 LAB — ANA: Anti Nuclear Antibody(ANA): POSITIVE — AB

## 2018-04-17 LAB — URINALYSIS, ROUTINE W REFLEX MICROSCOPIC
Bilirubin Urine: NEGATIVE
Glucose, UA: NEGATIVE
Hyaline Cast: NONE SEEN /LPF
Ketones, ur: NEGATIVE
Leukocytes, UA: NEGATIVE
Nitrite: NEGATIVE
Protein, ur: NEGATIVE
Specific Gravity, Urine: 1.028 (ref 1.001–1.03)
pH: <=5 (ref 5.0–8.0)

## 2018-04-17 LAB — CK: Total CK: 52 U/L (ref 29–143)

## 2018-04-17 LAB — URIC ACID: Uric Acid, Serum: 4.2 mg/dL (ref 2.5–7.0)

## 2018-04-17 LAB — 14-3-3 ETA PROTEIN: 14-3-3 eta Protein: <0.2 ng/mL (ref <0.2)

## 2018-04-17 LAB — ANTI-SMITH ANTIBODY: ENA SM Ab Ser-aCnc: <1 AI

## 2018-04-17 LAB — PAN-ANCA
ANCA Screen: NEGATIVE
Myeloperoxidase Abs: <1 AI
Serine Protease 3: <1 AI

## 2018-04-17 LAB — SJOGRENS SYNDROME-B EXTRACTABLE NUCLEAR ANTIBODY: SSB (La) (ENA) Antibody, IgG: <1 AI

## 2018-04-17 LAB — ANTI-NUCLEAR AB-TITER (ANA TITER): ANA Titer 1: 1:80 {titer} — ABNORMAL HIGH

## 2018-04-17 LAB — CRYOGLOBULIN: Cryoglobulin, Qualitative Analysis: NOT DETECTED

## 2018-04-17 LAB — LUPUS ANTICOAGULANT EVAL W/ REFLEX
PTT-LA Screen: 31 s (ref <=40)
dRVVT: 35 s (ref <=45)

## 2018-04-17 LAB — TSH: TSH: 1.13 mIU/L

## 2018-04-17 LAB — ANTI-DNA ANTIBODY, DOUBLE-STRANDED: ds DNA Ab: 2 IU/mL

## 2018-04-17 LAB — SJOGRENS SYNDROME-A EXTRACTABLE NUCLEAR ANTIBODY: SSA (Ro) (ENA) Antibody, IgG: <1 AI

## 2018-04-17 LAB — ANTI-SCLERODERMA ANTIBODY: Scleroderma (Scl-70) (ENA) Antibody, IgG: <1 AI

## 2018-04-17 LAB — RNP ANTIBODY: Ribonucleic Protein(ENA) Antibody, IgG: <1 AI

## 2018-04-17 NOTE — Progress Notes (Signed)
Sch appt to discuss results

## 2018-04-20 ENCOUNTER — Encounter: Payer: Self-pay | Admitting: Family Medicine

## 2018-04-20 ENCOUNTER — Ambulatory Visit: Payer: Managed Care, Other (non HMO) | Admitting: Family Medicine

## 2018-04-20 VITALS — BP 108/78 | HR 81 | Temp 98.0°F | Resp 16

## 2018-04-20 DIAGNOSIS — B351 Tinea unguium: Secondary | ICD-10-CM | POA: Diagnosis not present

## 2018-04-20 DIAGNOSIS — J4520 Mild intermittent asthma, uncomplicated: Secondary | ICD-10-CM

## 2018-04-20 DIAGNOSIS — J45909 Unspecified asthma, uncomplicated: Secondary | ICD-10-CM | POA: Insufficient documentation

## 2018-04-20 DIAGNOSIS — M2550 Pain in unspecified joint: Secondary | ICD-10-CM | POA: Diagnosis not present

## 2018-04-20 DIAGNOSIS — F988 Other specified behavioral and emotional disorders with onset usually occurring in childhood and adolescence: Secondary | ICD-10-CM | POA: Diagnosis not present

## 2018-04-20 DIAGNOSIS — G43109 Migraine with aura, not intractable, without status migrainosus: Secondary | ICD-10-CM | POA: Diagnosis not present

## 2018-04-20 MED ORDER — AMPHETAMINE-DEXTROAMPHETAMINE 15 MG PO TABS: 15.0000 mg | ORAL_TABLET | Freq: Two times a day (BID) | ORAL | 0 refills | Status: DC

## 2018-04-20 MED ORDER — MONTELUKAST SODIUM 10 MG PO TABS: 10.0000 mg | ORAL_TABLET | Freq: Every day | ORAL | 3 refills | Status: DC

## 2018-04-20 MED ORDER — TERBINAFINE HCL 250 MG PO TABS: 250.0000 mg | ORAL_TABLET | Freq: Every day | ORAL | 2 refills | Status: DC

## 2018-04-20 MED ORDER — ALBUTEROL SULFATE HFA 108 (90 BASE) MCG/ACT IN AERS: 2.0000 | INHALATION_SPRAY | Freq: Four times a day (QID) | RESPIRATORY_TRACT | 0 refills | Status: DC | PRN

## 2018-04-20 NOTE — Assessment & Plan Note (Signed)
Increased albuterol use Lung exam clear today Given that this is occurring during allergy season, we will try daily Singulair to see if this helps

## 2018-04-20 NOTE — Assessment & Plan Note (Signed)
Treat with terbinafine x12 weeks Recent CMP within normal limits Discussed that if rheumatology starts a DMARD, it is possible she would have to stop this medication due to possibility of hepatotoxicity

## 2018-04-20 NOTE — Assessment & Plan Note (Signed)
Stable and well-controlled No medication side effects Continue Adderall Refilled for 3 months

## 2018-04-20 NOTE — Assessment & Plan Note (Signed)
Followed by rheumatology Reviewed recent x-ray reports which all appear to show osteoarthritis and not inflammatory arthritis She has a follow-up later this month We will continue to watch her notes and recommendations

## 2018-04-20 NOTE — Patient Instructions (Signed)

## 2018-04-20 NOTE — Assessment & Plan Note (Signed)
Stable and well controlled with Botox injections She can continue magnesium and B12 if he seem to be helping for migraine prophylaxis Can continue Imitrex for treatment as needed Given that her migraines only occur every 4 to 6 months when her Botox is wearing off, it is unlikely to be helpful to take a daily medication for prophylaxis

## 2018-04-20 NOTE — Progress Notes (Signed)
Patient: [name] Female    DOB: 1976/08/07   42 y.o.   MRN: [id number] Visit Date: 04/20/2018  Today's Provider: Lavon Paganini, MD   I, Martha Clan, CMA, am acting as scribe for Lavon Paganini, MD.  Chief Complaint  Patient presents with  . Migraine  . ADD   Subjective:    HPI     Follow up for Migraines  The patient was last seen for this 3 months ago. Changes made at last visit include starting magnesium and B2.  She reports excellent compliance with treatment. She feels that condition is Unchanged. She is not having side effects.  She is also using Botox, which improves migraines. She states she notices her migraines are worse when the Botox starts to lose it's efficacy.  ------------------------------------------------------------------------------------   Follow up for ADD  The patient was last seen for this 3 months ago. Changes made at last visit include continuing Adderall.  She reports excellent compliance with treatment. She feels that condition is stable. She is not having side effects.   ------------------------------------------------------------------------------------  Pt is also c/o worsening asthma. States her wheezing is worse, and is now occurring daily. She is taking the albuterol inhaler more frequently , and she needs a refill of this.  Pt is concerned that she has toenail fungus,and is requesting a medication for this. She has tried topical creams, without relief.   Allergies  Allergen Reactions  . Penicillins Swelling    Pt states caused throat to swell   . Cefdinir Palpitations  . Sulfa Antibiotics Other (See Comments)    Lips tingled     Current Outpatient Medications:  .  albuterol (PROVENTIL HFA;VENTOLIN HFA) 108 (90 Base) MCG/ACT inhaler, Inhale 2 puffs into the lungs every 6 (six) hours as needed for wheezing or shortness of breath., Disp: 1 Inhaler, Rfl: 0 .  amphetamine-dextroamphetamine (ADDERALL) 15  MG tablet, Take 1 tablet by mouth 2 (two) times daily., Disp: 60 tablet, Rfl: 0 .  cholecalciferol (VITAMIN D) 1000 units tablet, Take 1,000 Units by mouth daily., Disp: , Rfl:  .  COLLAGEN PO, Take by mouth., Disp: , Rfl:  .  liothyronine (CYTOMEL) 5 MCG tablet, Take 25 mcg by mouth daily. , Disp: , Rfl:  .  MAGNESIUM PO, Take by mouth., Disp: , Rfl:  .  meloxicam (MOBIC) 15 MG tablet, Take 1 tablet (15 mg total) by mouth daily., Disp: 30 tablet, Rfl: 11 .  norethindrone-ethinyl estradiol (MICROGESTIN,JUNEL,LOESTRIN) 1-20 MG-MCG tablet, Take 1 tablet by mouth daily. 28 day supply--NOT 21 DAY, Disp: 3 Package, Rfl: 3 .  Omega-3 1000 MG CAPS, Take by mouth., Disp: , Rfl:  .  Probiotic Product (PROBIOTIC PO), Take by mouth., Disp: , Rfl:  .  Riboflavin (B2 PO), Take by mouth., Disp: , Rfl:  .  SUMAtriptan (IMITREX) 50 MG tablet, Take 1 tablet (50 mg total) every 2 (two) hours as needed by mouth for migraine. May repeat in 2 hours if headache persists or recurs., Disp: 10 tablet, Rfl: 0 .  traZODone (DESYREL) 150 MG tablet, Take 0.5 tablets (75 mg total) by mouth at bedtime., Disp: 45 tablet, Rfl: 11  Review of Systems  Constitutional: Positive for diaphoresis (with PMS) and fatigue. Negative for activity change, appetite change, chills, fever and unexpected weight change.  Respiratory: Positive for wheezing.   Cardiovascular: Positive for palpitations. Negative for chest pain.  Musculoskeletal: Positive for arthralgias.  Neurological: Positive for headaches.  Psychiatric/Behavioral: Positive for decreased concentration (stable  on Adderall).    Social History   Tobacco Use  . Smoking status: Never Smoker  . Smokeless tobacco: Never Used  Substance Use Topics  . Alcohol use: Yes    Alcohol/week: 0.0 oz    Comment: rare- wine    Objective:   BP 108/78 (BP Location: Left Arm, Patient Position: Sitting, Cuff Size: Normal)   Pulse 81   Temp 98 F (36.7 C) (Oral)   Resp 16   LMP  03/30/2018   SpO2 94%  Vitals:   04/20/18 1606  BP: 108/78  Pulse: 81  Resp: 16  Temp: 98 F (36.7 C)  TempSrc: Oral  SpO2: 94%     Physical Exam  Constitutional: She is oriented to person, place, and time. She appears well-developed and well-nourished. No distress.  HENT:  Head: Normocephalic and atraumatic.  Eyes: Conjunctivae are normal. Right eye exhibits no discharge. Left eye exhibits no discharge. No scleral icterus.  Cardiovascular: Normal rate, regular rhythm, normal heart sounds and intact distal pulses.  No murmur heard. Pulmonary/Chest: Effort normal and breath sounds normal. No respiratory distress. She has no wheezes. She has no rales.  Musculoskeletal: She exhibits no edema.  Onychomycosis of bilateral great toenails  Neurological: She is alert and oriented to person, place, and time.  Skin: Skin is warm and dry. Capillary refill takes less than 2 seconds. No rash noted.  Psychiatric: She has a normal mood and affect. Her behavior is normal.  Vitals reviewed.      Assessment & Plan:   Problem List Items Addressed This Visit      Cardiovascular and Mediastinum   Migraine with aura    Stable and well controlled with Botox injections She can continue magnesium and B12 if he seem to be helping for migraine prophylaxis Can continue Imitrex for treatment as needed Given that her migraines only occur every 4 to 6 months when her Botox is wearing off, it is unlikely to be helpful to take a daily medication for prophylaxis        Respiratory   Asthma    Increased albuterol use Lung exam clear today Given that this is occurring during allergy season, we will try daily Singulair to see if this helps      Relevant Medications   albuterol (PROVENTIL HFA;VENTOLIN HFA) 108 (90 Base) MCG/ACT inhaler   montelukast (SINGULAIR) 10 MG tablet     Musculoskeletal and Integument   Onychomycosis    Treat with terbinafine x12 weeks Recent CMP within normal  limits Discussed that if rheumatology starts a DMARD, it is possible she would have to stop this medication due to possibility of hepatotoxicity      Relevant Medications   terbinafine (LAMISIL) 250 MG tablet     Other   Polyarthralgia    Followed by rheumatology Reviewed recent x-ray reports which all appear to show osteoarthritis and not inflammatory arthritis She has a follow-up later this month We will continue to watch her notes and recommendations      ADD (attention deficit disorder) - Primary    Stable and well-controlled No medication side effects Continue Adderall Refilled for 3 months         Return in about 3 months (around 07/21/2018) for ADD f/u.   The entirety of the information documented in the History of Present Illness, Review of Systems and Physical Exam were personally obtained by me. Portions of this information were initially documented by Martha Clan, CMA and reviewed by me for thoroughness  and accuracy.    Virginia Crews, MD, MPH [facility] 04/20/2018 4:47 PM

## 2018-05-01 ENCOUNTER — Ambulatory Visit: Payer: Managed Care, Other (non HMO) | Admitting: Physician Assistant

## 2018-05-01 ENCOUNTER — Encounter: Payer: Self-pay | Admitting: Physician Assistant

## 2018-05-01 VITALS — BP 118/70 | HR 76 | Temp 98.0°F | Resp 16 | Wt 142.0 lb

## 2018-05-01 DIAGNOSIS — R300 Dysuria: Secondary | ICD-10-CM | POA: Diagnosis not present

## 2018-05-01 DIAGNOSIS — R42 Dizziness and giddiness: Secondary | ICD-10-CM

## 2018-05-01 DIAGNOSIS — E039 Hypothyroidism, unspecified: Secondary | ICD-10-CM

## 2018-05-01 NOTE — Progress Notes (Signed)
P      Patient: [name] Female    DOB: 04-Jun-1976   42 y.o.   MRN: [id number] Visit Date: 05/02/2018  Today's Provider: Trinna Post, PA-C   Chief Complaint  Patient presents with  . Ear Pain    Bilateral; Started about four days ago.  . Dizziness   Subjective:    [name] is a 42 y/o woman with a history of hypothyroidism, ADD, family history of RA currently being worked up for RA, history of sinusitis and sinuplasty presenting today for ear pain and dizziness. She reports her ears have been congested for several days. She was treated for a sinus infection in March with doxycycline. Says she has had surgery prior but still gets occasional sinus infections. She denies facial pain, fevers, lymphadenopathy.   She also reports dizziness. She says she feels like she herself is spinning. This does not occur with head movements. It does occur with movement in general, such as both sitting and standing, happened when she was working out. No headache out of the ordinary, no one sided weakness, change in vision. She reports longstanding history of palpitations that are not new for her and not worsening. No chest pain, vomiting, syncope. She has her hypothyroidism treated at [facility] MD with Cytomel. She denies pregnancy, says her period was last week.   She additionally reports lower abdominal pain that she describes feels like MSK pain but is persistent. She reports she had urine dipped 3 weeks ago that had some blood and she is concerned for infection. Says she had a yeas infection recently so she endorses vaginal burning but also reports increased urinary frequency.   Dizziness  This is a new problem. The current episode started in the past 7 days. The problem has been unchanged. Associated symptoms include abdominal pain (Lower right sided.), congestion, fatigue, headaches, nausea and vertigo. Pertinent negatives include no chills, coughing, diaphoresis, fever, neck pain, rash,  sore throat or vomiting.  Otalgia   There is pain in both ears. The problem occurs constantly. There has been no fever. Associated symptoms include abdominal pain (Lower right sided.), headaches and rhinorrhea. Pertinent negatives include no coughing, diarrhea, ear discharge, hearing loss, neck pain, rash, sore throat or vomiting.       Allergies  Allergen Reactions  . Penicillins Swelling    Pt states caused throat to swell   . Cefdinir Palpitations  . Sulfa Antibiotics Other (See Comments)    Lips tingled     Current Outpatient Medications:  .  albuterol (PROVENTIL HFA;VENTOLIN HFA) 108 (90 Base) MCG/ACT inhaler, Inhale 2 puffs into the lungs every 6 (six) hours as needed for wheezing or shortness of breath., Disp: 1 Inhaler, Rfl: 0 .  amphetamine-dextroamphetamine (ADDERALL) 15 MG tablet, Take 1 tablet by mouth 2 (two) times daily., Disp: 60 tablet, Rfl: 0 .  amphetamine-dextroamphetamine (ADDERALL) 15 MG tablet, Take 1 tablet by mouth 2 (two) times daily., Disp: 60 tablet, Rfl: 0 .  amphetamine-dextroamphetamine (ADDERALL) 15 MG tablet, Take 1 tablet by mouth 2 (two) times daily., Disp: 60 tablet, Rfl: 0 .  cholecalciferol (VITAMIN D) 1000 units tablet, Take 1,000 Units by mouth daily., Disp: , Rfl:  .  COLLAGEN PO, Take by mouth., Disp: , Rfl:  .  liothyronine (CYTOMEL) 5 MCG tablet, Take 25 mcg by mouth daily. , Disp: , Rfl:  .  MAGNESIUM PO, Take by mouth., Disp: , Rfl:  .  meloxicam (MOBIC) 15 MG tablet, Take 1  tablet (15 mg total) by mouth daily., Disp: 30 tablet, Rfl: 11 .  montelukast (SINGULAIR) 10 MG tablet, Take 1 tablet (10 mg total) by mouth at bedtime., Disp: 30 tablet, Rfl: 3 .  norethindrone-ethinyl estradiol (MICROGESTIN,JUNEL,LOESTRIN) 1-20 MG-MCG tablet, Take 1 tablet by mouth daily. 28 day supply--NOT 21 DAY, Disp: 3 Package, Rfl: 3 .  Omega-3 1000 MG CAPS, Take by mouth., Disp: , Rfl:  .  Probiotic Product (PROBIOTIC PO), Take by mouth., Disp: , Rfl:  .   Riboflavin (B2 PO), Take by mouth., Disp: , Rfl:  .  SUMAtriptan (IMITREX) 50 MG tablet, Take 1 tablet (50 mg total) every 2 (two) hours as needed by mouth for migraine. May repeat in 2 hours if headache persists or recurs., Disp: 10 tablet, Rfl: 0 .  terbinafine (LAMISIL) 250 MG tablet, Take 1 tablet (250 mg total) by mouth daily., Disp: 30 tablet, Rfl: 2 .  traZODone (DESYREL) 150 MG tablet, Take 0.5 tablets (75 mg total) by mouth at bedtime., Disp: 45 tablet, Rfl: 11  Review of Systems  Constitutional: Positive for fatigue. Negative for activity change, appetite change, chills, diaphoresis, fever and unexpected weight change.  HENT: Positive for congestion, ear pain, postnasal drip, rhinorrhea, sinus pressure, sinus pain and tinnitus. Negative for ear discharge, hearing loss, sore throat and trouble swallowing.   Eyes: Positive for discharge. Negative for photophobia, pain, redness, itching and visual disturbance.  Respiratory: Positive for wheezing. Negative for apnea, cough, choking, chest tightness, shortness of breath and stridor.   Cardiovascular: Negative.   Gastrointestinal: Positive for abdominal pain (Lower right sided.) and nausea. Negative for abdominal distention, anal bleeding, blood in stool, constipation, diarrhea, rectal pain and vomiting.  Musculoskeletal: Negative for neck pain.  Skin: Negative for rash.  Neurological: Positive for dizziness, vertigo, tremors, light-headedness and headaches.    Social History   Tobacco Use  . Smoking status: Never Smoker  . Smokeless tobacco: Never Used  Substance Use Topics  . Alcohol use: Yes    Alcohol/week: 0.0 oz    Comment: rare- wine    Objective:   BP 118/70 (BP Location: Right Arm, Patient Position: Sitting, Cuff Size: Normal)   Pulse 76   Temp 98 F (36.7 C) (Oral)   Resp 16   Wt 142 lb (64.4 kg)   BMI 22.24 kg/m  Vitals:   05/01/18 1108  BP: 118/70  Pulse: 76  Resp: 16  Temp: 98 F (36.7 C)  TempSrc: Oral    Weight: 142 lb (64.4 kg)     Physical Exam  Constitutional: She is oriented to person, place, and time. She appears well-developed and well-nourished.  HENT:  Head: Normocephalic and atraumatic.  Left Ear: External ear normal.  Mouth/Throat: Oropharynx is clear and moist. No oropharyngeal exudate.  Right TM opaque.   Eyes: Pupils are equal, round, and reactive to light.  Neck: Neck supple. No thyromegaly present.  Cardiovascular: Normal rate and regular rhythm.  Pulmonary/Chest: Effort normal and breath sounds normal.  Lymphadenopathy:    She has no cervical adenopathy.  Neurological: She is alert and oriented to person, place, and time. No cranial nerve deficit. Coordination normal.  Skin: Skin is warm and dry.  Psychiatric: She has a normal mood and affect. Her behavior is normal.        Assessment & Plan:     1. Dysuria  Will dip urine and send for cx. Patient requests diflucan so she can "keep it on hand" for a yeast infection. Will give  if antibiotic is prescribed, but not otherwise.  - POCT Urinalysis Dipstick - CULTURE, URINE COMPREHENSIVE  2. Dizziness  Unclear etiology. She has had extensive labwork 3 weeks ago as part of rheumatology workup and there was no anemia or electrolyte abnormality, TSH was normal. Her right TM is slightly opaque but she does not appear to have significant URI sx or sinus infection. Dizziness does not correspond with vertigo, or even orthostasis as she describes dizziness in all positions and all transitions. Low suspicion for cardiac etiology due to high fitness level and lack of symptoms. No neurological findings. Hydrate, may try sudafed for congestion. Re-evaluate if persistent.  3. Hypothyroidism, unspecified type  Managed by [facility], [place].   Return if symptoms worsen or fail to improve.  The entirety of the information documented in the History of Present Illness, Review of Systems and Physical Exam were personally obtained by me.  Portions of this information were initially documented by Ashley Royalty, CMA and reviewed by me for thoroughness and accuracy.   I have spent 25 minutes with this patient, >50% of which was spent on counseling and coordination of care.       Trinna Post, PA-C  [facility]

## 2018-05-01 NOTE — Patient Instructions (Signed)

## 2018-05-02 ENCOUNTER — Encounter: Payer: Self-pay | Admitting: Physician Assistant

## 2018-05-02 DIAGNOSIS — J0110 Acute frontal sinusitis, unspecified: Secondary | ICD-10-CM

## 2018-05-03 ENCOUNTER — Encounter: Payer: Self-pay | Admitting: Family Medicine

## 2018-05-03 ENCOUNTER — Other Ambulatory Visit: Payer: Self-pay

## 2018-05-03 ENCOUNTER — Telehealth: Payer: Self-pay

## 2018-05-03 ENCOUNTER — Telehealth: Payer: Self-pay | Admitting: *Deleted

## 2018-05-03 LAB — POCT URINALYSIS DIPSTICK
Bilirubin, UA: NEGATIVE
Glucose, UA: NEGATIVE
Ketones, UA: NEGATIVE
Leukocytes, UA: NEGATIVE
Nitrite, UA: NEGATIVE
Protein, UA: POSITIVE — AB
Spec Grav, UA: >=1.03 — AB (ref 1.010–1.025)
Urobilinogen, UA: 0.2 E.U./dL
pH, UA: 5 (ref 5.0–8.0)

## 2018-05-03 LAB — CULTURE, URINE COMPREHENSIVE

## 2018-05-03 MED ORDER — FLUCONAZOLE 150 MG PO TABS: 150.0000 mg | ORAL_TABLET | Freq: Once | ORAL | 1 refills | Status: AC

## 2018-05-03 MED ORDER — DOXYCYCLINE HYCLATE 100 MG PO TABS: 100.0000 mg | ORAL_TABLET | Freq: Two times a day (BID) | ORAL | 0 refills | Status: AC

## 2018-05-03 NOTE — Telephone Encounter (Signed)
Patient called and states that she has a yeast infection . She has used OTC medication and it has not helped any. Patient is wondering if [name] can call her in a prescription . Her pharmacy is [facility] on [address] . Please advise. Thank you

## 2018-05-03 NOTE — Telephone Encounter (Signed)
Script sent and mychart message sent to pt.

## 2018-05-03 NOTE — Telephone Encounter (Signed)
Pt called wanting to know if you were going to send in an antibiotic.  She says her symptoms have not improved  She uses [facility] [address]  [place]

## 2018-05-04 ENCOUNTER — Ambulatory Visit: Payer: Self-pay | Admitting: Rheumatology

## 2018-05-04 ENCOUNTER — Telehealth: Payer: Self-pay

## 2018-05-04 NOTE — Telephone Encounter (Signed)
Pt advised.   Thanks,   -  

## 2018-05-04 NOTE — Telephone Encounter (Signed)
-----   Message from Trinna Post, [place] sent at 05/03/2018  4:50 PM EDT ----- There is no bacterial growth and no evidence of urinary tract infection this culture.

## 2018-05-09 ENCOUNTER — Ambulatory Visit: Payer: Self-pay | Admitting: Rheumatology

## 2018-05-11 DIAGNOSIS — M19041 Primary osteoarthritis, right hand: Secondary | ICD-10-CM | POA: Insufficient documentation

## 2018-05-11 DIAGNOSIS — M19071 Primary osteoarthritis, right ankle and foot: Secondary | ICD-10-CM | POA: Insufficient documentation

## 2018-05-11 DIAGNOSIS — M19072 Primary osteoarthritis, left ankle and foot: Secondary | ICD-10-CM

## 2018-05-11 DIAGNOSIS — I7300 Raynaud's syndrome without gangrene: Secondary | ICD-10-CM | POA: Insufficient documentation

## 2018-05-11 DIAGNOSIS — M19042 Primary osteoarthritis, left hand: Secondary | ICD-10-CM | POA: Insufficient documentation

## 2018-05-11 NOTE — Progress Notes (Signed)
Office Visit Note  Patient: [name]             Date of Birth: [birth year]           MRN: [id number]             PCP: Virginia Crews, MD Referring: Virginia Crews, MD Visit Date: 05/18/2018 Occupation: _0 @    Subjective:  Fatigue.   History of Present Illness: [name] is a 42 y.o. female with history of polyarthralgias.  She states she is been experiencing extreme fatigue recently.  She also has noticed tremors in her hands especially her left hand.  She continues to have rainouts symptoms.  Sicca symptoms persist.  She continues to have some discomfort in her bilateral thumb first MCP joint.  Activities of Daily Living:  Patient reports morning stiffness for 20 minutes.   Patient Reports nocturnal pain.  Difficulty dressing/grooming: Denies Difficulty climbing stairs: Denies Difficulty getting out of chair: Denies Difficulty using hands for taps, buttons, cutlery, and/or writing: Reports   Review of Systems  Constitutional: Positive for fatigue. Negative for activity change, night sweats, weight gain and weight loss.  HENT: Positive for mouth dryness. Negative for mouth sores, trouble swallowing, trouble swallowing and nose dryness.   Eyes: Positive for dryness. Negative for pain, redness and visual disturbance.  Respiratory: Negative for cough, shortness of breath and difficulty breathing.   Cardiovascular: Negative for chest pain, palpitations, hypertension, irregular heartbeat and swelling in legs/feet.  Gastrointestinal: Positive for constipation. Negative for abdominal pain, blood in stool and diarrhea.  Endocrine: Negative for increased urination.  Genitourinary: Negative for pelvic pain and vaginal dryness.  Musculoskeletal: Positive for morning stiffness. Negative for arthralgias, joint pain, joint swelling, myalgias, muscle weakness, muscle tenderness and myalgias.  Skin: Negative for color change, rash, hair loss, skin tightness,  ulcers and sensitivity to sunlight.  Allergic/Immunologic: Negative for susceptible to infections.  Neurological: Positive for tremors. Negative for dizziness, memory loss, night sweats and weakness.  Hematological: Positive for bruising/bleeding tendency. Negative for swollen glands.  Psychiatric/Behavioral: Positive for depressed mood. Negative for sleep disturbance. The patient is not nervous/anxious.     PMFS History:  Patient Active Problem List   Diagnosis Date Noted  . Primary osteoarthritis of both hands 05/11/2018  . Primary osteoarthritis of both feet 05/11/2018  . Raynaud's disease without gangrene 05/11/2018  . Asthma 04/20/2018  . Onychomycosis 04/20/2018  . Family history of rheumatoid arthritis 04/10/2018  . Dry mouth 01/23/2018  . Radicular pain in right arm 10/16/2017  . PTSD (post-traumatic stress disorder) 10/16/2017  . Insomnia 10/16/2017  . Migraine with aura 10/16/2017  . Hypothyroidism   . ADD (attention deficit disorder)   . Uterine leiomyoma 09/22/2017  . 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 History of uterine fibroid 09/22/2017  . Polyarthralgia 12/09/2016    Past Medical History:  Diagnosis Date  . ADD (attention deficit disorder)   . Bone spur    "front of spine" - effects right shoulder (sometimes)  . Deviated nasal septum   . Hypothyroidism   . PTSD (post-traumatic stress disorder)   . RA (rheumatoid arthritis) ([place]) 2016  . Rheumatoid arthritis ([place]) 12/09/2016  . Sinusitis     Family History  Problem Relation Age of Onset  . Rheum arthritis Mother   . Cancer Paternal Grandfather   . Cancer Cousin   . Vascular Disease Father   . Coronary artery disease Father   . Alcohol abuse Brother  history of   . Drug abuse Brother        history of    Past Surgical History:  Procedure Laterality Date  . CESAREAN SECTION    . DILATION AND CURETTAGE OF UTERUS     (x2)  . FRONTAL SINUS EXPLORATION Bilateral 11/19/2015   Procedure:  FRONTAL SINUS EXPLORATION;  Surgeon: Margaretha Sheffield, MD;  Location: [place];  Service: ENT;  Laterality: Bilateral;  . IMAGE GUIDED SINUS SURGERY N/A 11/19/2015   Procedure: IMAGE GUIDED SINUS SURGERY;  Surgeon: Margaretha Sheffield, MD;  Location: [place];  Service: ENT;  Laterality: N/A;  GAVE DISK TO CE CE 09/22  . MAXILLARY ANTROSTOMY Bilateral 11/19/2015   Procedure: MAXILLARY ANTROSTOMY;  Surgeon: Margaretha Sheffield, MD;  Location: [place];  Service: ENT;  Laterality: Bilateral;  . SEPTOPLASTY N/A 11/19/2015   Procedure: SEPTOPLASTY;  Surgeon: Margaretha Sheffield, MD;  Location: [place];  Service: ENT;  Laterality: N/A;  . SPHENOIDECTOMY Bilateral 11/19/2015   Procedure: Coralee Pesa;  Surgeon: Margaretha Sheffield, MD;  Location: [place];  Service: ENT;  Laterality: Bilateral;  . TONSILLECTOMY     Social History   Social History Narrative  . Not on file     Objective: Vital Signs: BP 120/79 (BP Location: Left Arm, Patient Position: Sitting, Cuff Size: Normal)   Pulse 71   Resp 12   Ht 5' 7" (1.702 m)   Wt 142 lb (64.4 kg)   BMI 22.24 kg/m    Physical Exam  Constitutional: She is oriented to person, place, and time. She appears well-developed and well-nourished.  HENT:  Head: Normocephalic and atraumatic.  Eyes: Conjunctivae and EOM are normal.  Neck: Normal range of motion.  Cardiovascular: Normal rate, regular rhythm, normal heart sounds and intact distal pulses.  Pulmonary/Chest: Effort normal and breath sounds normal.  Abdominal: Soft. Bowel sounds are normal.  Lymphadenopathy:    She has no cervical adenopathy.  Neurological: She is alert and oriented to person, place, and time.  Skin: Skin is warm and dry. Capillary refill takes less than 2 seconds.  Psychiatric: She has a normal mood and affect. Her behavior is normal.  Nursing note and vitals reviewed.    Musculoskeletal Exam: C-spine thoracic lumbar spine good range of motion.   Shoulder joints elbow joints wrist joint MCPs PIPs DIPs been good range of motion with no synovitis.  Hip joints knee joints ankles MTPs PIPs been good range of motion with no synovitis.  CDAI Exam: No CDAI exam completed.    Investigation: Findings:  March 30, 2017 anticardiolipin IgG and IgM negative, beta-2 IgG and IgM negative, G6PD normal, RF negative, ESR normal, CRP normal, C3 and C4 normal, hepatitis B-, hepatitis C negative  April 10, 2018 CBC normal, CMP normal, UA positive for calcium oxalate crystals, and mixed urogenital flora, TSH normal, CK 52 ANA 1: 80 homogeneous ENA negative, lupus anticoagulant negative ANCA negative cryoglobulins negative, anti-CCP negative, _0 et a negative, uric acid 4.2  Imaging: No results found.  Speciality Comments: No specialty comments available.    Procedures:  No procedures performed Allergies: Penicillins; Cefdinir; and Sulfa antibiotics   Assessment / Plan:     Visit Diagnoses: Polyarthralgia - ANA 1:80 NH,All autoimmune work-up negative.  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  No synovitis was noted.  Sicca syndrome (HCC) - History of dry mouth and dry eyes with negative autoimmune work-up.  Over-the-counter products were discussed.  Raynaud's disease without gangrene-she had good peripheral pulses and good capillary refill on examination today her hands warm to touch.  She brought some pictures which show discoloration in her fingertips.  She has some thickening of her right clavicle.  Pain of right clavicle - She had had cortisone injection in March.  She has been using proper fitting shoes.  Primary osteoarthritis of both hands - With bilateral first MCP thickening on exam and narrowing on the x-ray.  Primary osteoarthritis of both feet-  Coarse tremors - Plan: Ambulatory referral to Neurology  Other fatigue -she has been experiencing increased fatigue.  I will obtain  following labs.  Plan: Epstein-Barr virus VCA antibody panel, Serum protein electrophoresis with reflex, VITAMIN D 25 Hydroxy (Vit-D Deficiency, Fractures)   Other medical problems are listed as follows:  Female pattern hair loss  History of hypothyroidism  History of migraine  History of insomnia  History of posttraumatic stress disorder (PTSD)  Uterine leiomyoma, unspecified location     Orders: Orders Placed This Encounter  Procedures  . Epstein-Barr virus VCA antibody panel  . Serum protein electrophoresis with reflex  . VITAMIN D 25 Hydroxy (Vit-D Deficiency, Fractures)  . Ambulatory referral to Neurology   No orders of the defined types were placed in this encounter.   Face-to-face time spent with patient was 30 minutes. >50% of time was spent in counseling and coordination of care.  Follow-Up Instructions: Return in about 1 year (around 05/19/2019) for Raynaud's, +ANA.  I will contact her once the lab results are available.   Bo Merino, MD  Note - This record has been created using Editor, commissioning.  Chart creation errors have been sought, but may not always  have been located. Such creation errors do not reflect on  the standard of medical care.

## 2018-05-18 ENCOUNTER — Ambulatory Visit: Payer: Managed Care, Other (non HMO) | Admitting: Rheumatology

## 2018-05-18 ENCOUNTER — Encounter: Payer: Self-pay | Admitting: Rheumatology

## 2018-05-18 VITALS — BP 120/79 | HR 71 | Resp 12 | Ht 67.0 in | Wt 142.0 lb

## 2018-05-18 DIAGNOSIS — M3500 Sicca syndrome, unspecified: Secondary | ICD-10-CM | POA: Diagnosis not present

## 2018-05-18 DIAGNOSIS — M2550 Pain in unspecified joint: Secondary | ICD-10-CM | POA: Diagnosis not present

## 2018-05-18 DIAGNOSIS — R5383 Other fatigue: Secondary | ICD-10-CM

## 2018-05-18 DIAGNOSIS — M19041 Primary osteoarthritis, right hand: Secondary | ICD-10-CM

## 2018-05-18 DIAGNOSIS — M19071 Primary osteoarthritis, right ankle and foot: Secondary | ICD-10-CM | POA: Diagnosis not present

## 2018-05-18 DIAGNOSIS — I7300 Raynaud's syndrome without gangrene: Secondary | ICD-10-CM | POA: Diagnosis not present

## 2018-05-18 DIAGNOSIS — M19072 Primary osteoarthritis, left ankle and foot: Secondary | ICD-10-CM

## 2018-05-18 DIAGNOSIS — M898X1 Other specified disorders of bone, shoulder: Secondary | ICD-10-CM | POA: Diagnosis not present

## 2018-05-18 DIAGNOSIS — M19042 Primary osteoarthritis, left hand: Secondary | ICD-10-CM

## 2018-05-18 DIAGNOSIS — G252 Other specified forms of tremor: Secondary | ICD-10-CM | POA: Diagnosis not present

## 2018-05-21 LAB — PROTEIN ELECTROPHORESIS, SERUM, WITH REFLEX
Albumin ELP: 4.2 g/dL (ref 3.8–4.8)
Alpha 1: 0.3 g/dL (ref 0.2–0.3)
Alpha 2: 0.5 g/dL (ref 0.5–0.9)
Beta 2: 0.2 g/dL (ref 0.2–0.5)
Beta Globulin: 0.4 g/dL (ref 0.4–0.6)
Gamma Globulin: 0.7 g/dL — ABNORMAL LOW (ref 0.8–1.7)
Total Protein: 6.3 g/dL (ref 6.1–8.1)

## 2018-05-21 LAB — EPSTEIN-BARR VIRUS VCA ANTIBODY PANEL
EBV NA IgG: 42.3 U/mL — ABNORMAL HIGH
EBV VCA IgG: >750 U/mL — ABNORMAL HIGH
EBV VCA IgM: <36 U/mL

## 2018-05-21 NOTE — Progress Notes (Signed)
The lab indicates EBV infection in the past no recent infection.

## 2018-06-05 ENCOUNTER — Encounter: Payer: Self-pay | Admitting: Neurology

## 2018-06-08 ENCOUNTER — Encounter: Payer: Self-pay | Admitting: Family Medicine

## 2018-06-11 MED ORDER — SCOPOLAMINE 1 MG/3DAYS TD PT72: 1.0000 | MEDICATED_PATCH | TRANSDERMAL | 0 refills | Status: DC

## 2018-06-11 NOTE — Telephone Encounter (Signed)
Please review for Dr. B ° ° °Thanks,  ° °-Laura  °

## 2018-06-18 ENCOUNTER — Ambulatory Visit: Payer: Self-pay | Admitting: Neurology

## 2018-06-25 ENCOUNTER — Ambulatory Visit: Payer: Self-pay | Admitting: Family Medicine

## 2018-06-25 ENCOUNTER — Other Ambulatory Visit: Payer: Self-pay

## 2018-06-25 ENCOUNTER — Telehealth: Payer: Self-pay | Admitting: Certified Nurse Midwife

## 2018-06-25 VITALS — BP 128/77 | HR 86 | Temp 98.4°F | Resp 20

## 2018-06-25 DIAGNOSIS — J0100 Acute maxillary sinusitis, unspecified: Secondary | ICD-10-CM

## 2018-06-25 DIAGNOSIS — Z8619 Personal history of other infectious and parasitic diseases: Secondary | ICD-10-CM

## 2018-06-25 MED ORDER — FLUCONAZOLE 150 MG PO TABS: 150.0000 mg | ORAL_TABLET | Freq: Once | ORAL | 0 refills | Status: AC

## 2018-06-25 MED ORDER — TERCONAZOLE 0.8 % VA CREA: 1.0000 | TOPICAL_CREAM | Freq: Every day | VAGINAL | 0 refills | Status: AC

## 2018-06-25 MED ORDER — DOXYCYCLINE HYCLATE 100 MG PO TABS: 100.0000 mg | ORAL_TABLET | Freq: Two times a day (BID) | ORAL | 0 refills | Status: AC

## 2018-06-25 NOTE — Telephone Encounter (Signed)
The patient called and stated that she was diagnosed with a sinus infection and is on antibiotics. The patient stated that she is going to need to diflucan sent to her pharmacy today before 5 pm, because she is going out of the country to [place]. I informed the patient that a nurse will call her as soon as they can and responded she needs this STAT. Please advise.

## 2018-06-25 NOTE — Progress Notes (Signed)
Subjective: congestion     [name] is a 42 y.o. female who presents for evaluation of facial pressure, purulent nasal discharge, and nonproductive cough for 2 weeks.  Patient reports symptoms have remained stable during the last 2 weeks.  Reports greater pressure to right maxillary sinus than left.  Patient has a history of sinuplasty 2.5 years ago by Dr. Farrel Conners.  Patient reports she gets approximately 4 sinus infections each year.  Patient has not seen ENT in years.  Patient also endorses fatigue but reports chronic fatigue.  Patient has a history of asthma and reports using her albuterol inhaler only occasionally with exercise, URIs, and laughing as needed.  Patient reports having to use this more frequently in the last 2 weeks with resolution of mild intermittent wheezing. Treatment to date: DayQuil and antihistamine.  Denies rash, nausea, vomiting, diarrhea, SOB, chest or back pain, ear pain, sore throat, difficulty swallowing, confusion, headache, body aches, fever, chills, severe symptoms, or initial improvement and then worsening of symptoms. History of smoking, asthma, COPD: Positive for asthma only. History of recurrent sinus and/or lung infections: Approximately 4 sinus infections a year.  Reports approximately one episode of bronchitis each year.  Otherwise negative.  Review of Systems Pertinent items noted in HPI and remainder of comprehensive ROS otherwise negative.     Objective:   Physical Exam General: Awake, alert, and oriented. No acute distress. Well developed, hydrated and nourished. Appears stated age. Nontoxic appearance.  HEENT:  PND noted.  No erythema to posterior oropharynx.  No edema or exudates of pharynx or tonsils. No erythema or bulging of TM. Erythema/edema to nasal mucosa.  Bilateral frontal and maxillary sinus tenderness, right greater than left. Supple neck without adenopathy. Cardiac: Heart rate and rhythm are normal. No murmurs, gallops, or rubs are  auscultated. S1 and S2 are heard and are of normal intensity.  Respiratory: No signs of respiratory distress. Lungs clear. No tachypnea. Able to speak in full sentences without dyspnea. Nonlabored respirations.  Skin: Skin is warm, dry and intact. Appropriate color for ethnicity. No cyanosis noted.   Assessment:    sinusitis   Plan:    Discussed the diagnosis and treatment of sinusitis. Suggested symptomatic OTC remedies. Nasal saline spray for congestion.   Prescribed doxycycline due to patient's allergies.  Patient has taken this in the past and tolerated it well.  Patient has a history of inadequate treatment of sinus infections in the past with only 7 days of doxycycline.  10-day courses have traditionally been more effective for her. Patient requesting medication in case she gets Candida vulvovaginitis, due to history of this in the past.  Provided prescription for Terazol cream. Recommended follow-up with ENT due to recurrent sinus infections, patient plans to see Dr. Farrel Conners. Follow-up with ENT. Discussed red flag symptoms and circumstances with which to seek medical care.

## 2018-07-12 ENCOUNTER — Encounter: Payer: Managed Care, Other (non HMO) | Admitting: Certified Nurse Midwife

## 2018-07-20 ENCOUNTER — Other Ambulatory Visit: Payer: Self-pay | Admitting: Family Medicine

## 2018-07-20 ENCOUNTER — Encounter: Payer: Self-pay | Admitting: Family Medicine

## 2018-07-20 ENCOUNTER — Ambulatory Visit: Payer: Managed Care, Other (non HMO) | Admitting: Family Medicine

## 2018-07-20 ENCOUNTER — Ambulatory Visit: Payer: Self-pay | Admitting: Neurology

## 2018-07-20 VITALS — BP 112/72 | HR 71 | Temp 97.9°F | Resp 16 | Wt 139.0 lb

## 2018-07-20 DIAGNOSIS — F988 Other specified behavioral and emotional disorders with onset usually occurring in childhood and adolescence: Secondary | ICD-10-CM

## 2018-07-20 DIAGNOSIS — E039 Hypothyroidism, unspecified: Secondary | ICD-10-CM

## 2018-07-20 MED ORDER — AMPHETAMINE-DEXTROAMPHETAMINE 15 MG PO TABS: 15.0000 mg | ORAL_TABLET | Freq: Two times a day (BID) | ORAL | 0 refills | Status: DC

## 2018-07-20 NOTE — Assessment & Plan Note (Signed)
Stable and well controlled No medication side effects Continue Adderall F/u in 3months 

## 2018-07-20 NOTE — Assessment & Plan Note (Signed)
Followed by [facility] in [place] They are prescribing cytomel and checking labs regularly

## 2018-07-20 NOTE — Progress Notes (Signed)
Patient: [name] Female    DOB: 12-16-1975   42 y.o.   MRN: [id number] Visit Date: 07/20/2018  Today's Provider: Lavon Paganini, MD   I, Martha Clan, CMA, am acting as scribe for Lavon Paganini, MD.  Chief Complaint  Patient presents with  . ADD   Subjective:    HPI     Follow up for ADD  The patient was last seen for this 3 months ago. Changes made at last visit include continuing Adderall.  She reports good compliance with treatment. She feels that condition is Improved. She is not having side effects.   ------------------------------------------------------------------------------------  Pt is going to [facility] in [place] for her thyroid testing, and they found pt to be perimenopausal, with low testosterone. She is currently receiving testosterone 87.5 mg pellets. She has also D/C all supplements/ vitamins.  She has been feeling great since starting testosterone supplement.  Her libido and vaginal dryness are improving.  She is having irregular periods.    Allergies  Allergen Reactions  . Penicillins Swelling    Pt states caused throat to swell   . Cefdinir Palpitations  . Sulfa Antibiotics Other (See Comments)    Lips tingled     Current Outpatient Medications:  .  albuterol (PROVENTIL HFA;VENTOLIN HFA) 108 (90 Base) MCG/ACT inhaler, Inhale 2 puffs into the lungs every 6 (six) hours as needed for wheezing or shortness of breath., Disp: 1 Inhaler, Rfl: 0 .  COLLAGEN PO, Take by mouth., Disp: , Rfl:  .  liothyronine (CYTOMEL) 5 MCG tablet, Take 25 mcg by mouth daily. , Disp: , Rfl:  .  meloxicam (MOBIC) 15 MG tablet, Take 1 tablet (15 mg total) by mouth daily., Disp: 30 tablet, Rfl: 11 .  norethindrone-ethinyl estradiol (MICROGESTIN,JUNEL,LOESTRIN) 1-20 MG-MCG tablet, Take 1 tablet by mouth daily. 28 day supply--NOT 21 DAY, Disp: 3 Package, Rfl: 3 .  SUMAtriptan (IMITREX) 50 MG tablet, Take 1 tablet (50 mg total) every 2 (two) hours as  needed by mouth for migraine. May repeat in 2 hours if headache persists or recurs., Disp: 10 tablet, Rfl: 0 .  terbinafine (LAMISIL) 250 MG tablet, TAKE 1 TABLET(250 MG) BY MOUTH DAILY, Disp: 30 tablet, Rfl: 1 .  Testosterone 87.5 MG PLLT, 87.5 mg by Implant route every 4 (four) months., Disp: , Rfl:  .  traZODone (DESYREL) 150 MG tablet, Take 0.5 tablets (75 mg total) by mouth at bedtime., Disp: 45 tablet, Rfl: 11 .  amphetamine-dextroamphetamine (ADDERALL) 15 MG tablet, Take 1 tablet by mouth 2 (two) times daily., Disp: 60 tablet, Rfl: 0 .  amphetamine-dextroamphetamine (ADDERALL) 15 MG tablet, Take 1 tablet by mouth 2 (two) times daily., Disp: 60 tablet, Rfl: 0 .  amphetamine-dextroamphetamine (ADDERALL) 15 MG tablet, Take 1 tablet by mouth 2 (two) times daily., Disp: 60 tablet, Rfl: 0  Review of Systems  Constitutional: Negative.   HENT: Negative.   Respiratory: Negative.   Cardiovascular: Negative.   Gastrointestinal: Negative.   Genitourinary: Negative.   Neurological: Negative.   Psychiatric/Behavioral: Negative.     Social History   Tobacco Use  . Smoking status: Never Smoker  . Smokeless tobacco: Never Used  Substance Use Topics  . Alcohol use: Yes    Alcohol/week: 0.0 standard drinks    Comment: rare- wine    Objective:   BP 112/72 (BP Location: Left Arm, Patient Position: Sitting, Cuff Size: Normal)   Pulse 71   Temp 97.9 F (36.6 C) (Oral)  Resp 16   Wt 139 lb (63 kg)   SpO2 99%   BMI 21.77 kg/m  Vitals:   07/20/18 1616  BP: 112/72  Pulse: 71  Resp: 16  Temp: 97.9 F (36.6 C)  TempSrc: Oral  SpO2: 99%  Weight: 139 lb (63 kg)     Physical Exam  Constitutional: She is oriented to person, place, and time. She appears well-developed and well-nourished. No distress.  HENT:  Head: Normocephalic and atraumatic.  Eyes: Conjunctivae are normal. No scleral icterus.  Neck: Neck supple. No thyromegaly present.  Cardiovascular: Normal rate, regular rhythm,  normal heart sounds and intact distal pulses.  No murmur heard. Pulmonary/Chest: Effort normal and breath sounds normal. No respiratory distress. She has no wheezes. She has no rales.  Musculoskeletal: She exhibits no edema.  Lymphadenopathy:    She has no cervical adenopathy.  Neurological: She is alert and oriented to person, place, and time.  Skin: Skin is warm and dry. Capillary refill takes less than 2 seconds. No rash noted.  Psychiatric: She has a normal mood and affect. Her behavior is normal.  Vitals reviewed.      Assessment & Plan:   Problem List Items Addressed This Visit      Endocrine   Hypothyroidism    Followed by [facility] in [place] They are prescribing cytomel and checking labs regularly        Other   ADD (attention deficit disorder) - Primary    Stable and well controlled No medication side effects Continue Adderall F/u in 3 months          Return in about 3 months (around 10/20/2018) for ADD f/u.   The entirety of the information documented in the History of Present Illness, Review of Systems and Physical Exam were personally obtained by me. Portions of this information were initially documented by Raquel Sarna Ratchford, CMA and reviewed by me for thoroughness and accuracy.    Virginia Crews, MD, MPH [facility] 07/20/2018 4:42 PM

## 2018-08-14 ENCOUNTER — Other Ambulatory Visit: Payer: Self-pay | Admitting: Family Medicine

## 2018-08-20 ENCOUNTER — Encounter: Payer: Self-pay | Admitting: Family Medicine

## 2018-08-21 MED ORDER — AMPHETAMINE-DEXTROAMPHETAMINE 15 MG PO TABS: 15.0000 mg | ORAL_TABLET | Freq: Two times a day (BID) | ORAL | 0 refills | Status: DC

## 2018-09-04 ENCOUNTER — Telehealth: Payer: Self-pay | Admitting: Family Medicine

## 2018-09-04 ENCOUNTER — Encounter: Payer: Self-pay | Admitting: Family Medicine

## 2018-09-04 ENCOUNTER — Ambulatory Visit: Payer: Self-pay

## 2018-09-04 ENCOUNTER — Ambulatory Visit (INDEPENDENT_AMBULATORY_CARE_PROVIDER_SITE_OTHER): Payer: Managed Care, Other (non HMO) | Admitting: Family Medicine

## 2018-09-04 VITALS — BP 114/70 | HR 83 | Temp 98.3°F | Resp 16 | Wt 140.0 lb

## 2018-09-04 DIAGNOSIS — T3695XA Adverse effect of unspecified systemic antibiotic, initial encounter: Secondary | ICD-10-CM | POA: Diagnosis not present

## 2018-09-04 DIAGNOSIS — J0100 Acute maxillary sinusitis, unspecified: Secondary | ICD-10-CM

## 2018-09-04 DIAGNOSIS — B379 Candidiasis, unspecified: Secondary | ICD-10-CM | POA: Diagnosis not present

## 2018-09-04 DIAGNOSIS — L0202 Furuncle of face: Secondary | ICD-10-CM | POA: Diagnosis not present

## 2018-09-04 MED ORDER — FLUCONAZOLE 150 MG PO TABS: ORAL_TABLET | ORAL | 0 refills | Status: DC

## 2018-09-04 MED ORDER — DOXYCYCLINE HYCLATE 100 MG PO TABS: 100.0000 mg | ORAL_TABLET | Freq: Two times a day (BID) | ORAL | 0 refills | Status: DC

## 2018-09-04 NOTE — Progress Notes (Signed)
Patient: [name] Female    DOB: Nov 14, 1976   42 y.o.   MRN: [id number] Visit Date: 09/04/2018  Today's Provider: Vernie Murders, PA   Chief Complaint  Patient presents with  . Insect Bite  . Rash   Subjective:    Rash  This is a new problem. The current episode started in the past 7 days. The problem has been resolved since onset. The affected locations include the back, torso, abdomen, left buttock, right buttock, left upper leg and right upper leg. The rash is characterized by burning, dryness, pain, redness, swelling and itchiness. She was exposed to nothing. Associated symptoms include joint pain. Pertinent negatives include no anorexia, congestion, cough, diarrhea, eye pain, facial edema, fatigue, fever, nail changes, rhinorrhea, shortness of breath, sore throat or vomiting. Past treatments include nothing. The treatment provided no relief (rash resolved on it on).     Insect Bite Patient comes in office today with concerns of a possible insect bite to her face. Patient states that 3 days ago she was outside her home and believes she may have been bitten. Patient reports tenderness, redness and swelling on the right side of her cheek and tenderness under right eyelid. Patient reports that she has taken otc Benadryl for relief.  Past Medical History:  Diagnosis Date  . ADD (attention deficit disorder)   . Bone spur    "front of spine" - effects right shoulder (sometimes)  . Deviated nasal septum   . Hypothyroidism   . PTSD (post-traumatic stress disorder)   . RA (rheumatoid arthritis) ([place]) 2016  . Rheumatoid arthritis ([place]) 12/09/2016  . Sinusitis    Past Surgical History:  Procedure Laterality Date  . CESAREAN SECTION    . DILATION AND CURETTAGE OF UTERUS     (x2)  . FRONTAL SINUS EXPLORATION Bilateral 11/19/2015   Procedure: FRONTAL SINUS EXPLORATION;  Surgeon: Margaretha Sheffield, MD;  Location: [place];  Service: ENT;  Laterality: Bilateral;  .  IMAGE GUIDED SINUS SURGERY N/A 11/19/2015   Procedure: IMAGE GUIDED SINUS SURGERY;  Surgeon: Margaretha Sheffield, MD;  Location: [place];  Service: ENT;  Laterality: N/A;  GAVE DISK TO CE CE 09/22  . MAXILLARY ANTROSTOMY Bilateral 11/19/2015   Procedure: MAXILLARY ANTROSTOMY;  Surgeon: Margaretha Sheffield, MD;  Location: [place];  Service: ENT;  Laterality: Bilateral;  . SEPTOPLASTY N/A 11/19/2015   Procedure: SEPTOPLASTY;  Surgeon: Margaretha Sheffield, MD;  Location: [place];  Service: ENT;  Laterality: N/A;  . SPHENOIDECTOMY Bilateral 11/19/2015   Procedure: Coralee [place];  Surgeon: Margaretha Sheffield, MD;  Location: [place];  Service: ENT;  Laterality: Bilateral;  . TONSILLECTOMY     Family History  Problem Relation Age of Onset  . Rheum arthritis Mother   . Cancer Paternal Grandfather   . Cancer Cousin   . Vascular Disease Father   . Coronary artery disease Father   . Alcohol abuse Brother        history of   . Drug abuse Brother        history of    Allergies  Allergen Reactions  . Penicillins Swelling    Pt states caused throat to swell   . Cefdinir Palpitations  . Sulfa Antibiotics Other (See Comments)    Lips tingled    Current Outpatient Medications:  .  albuterol (PROVENTIL HFA;VENTOLIN HFA) 108 (90 Base) MCG/ACT inhaler, INHALE 2 PUFFS INTO THE LUNGS EVERY 6 HOURS AS NEEDED FOR WHEEZING OR  SHORTNESS OF BREATH, Disp: 8.5 g, Rfl: 2 .  amphetamine-dextroamphetamine (ADDERALL) 15 MG tablet, Take 1 tablet by mouth 2 (two) times daily., Disp: 60 tablet, Rfl: 0 .  amphetamine-dextroamphetamine (ADDERALL) 15 MG tablet, Take 1 tablet by mouth 2 (two) times daily., Disp: 60 tablet, Rfl: 0 .  amphetamine-dextroamphetamine (ADDERALL) 15 MG tablet, Take 1 tablet by mouth 2 (two) times daily., Disp: 60 tablet, Rfl: 0 .  COLLAGEN PO, Take by mouth., Disp: , Rfl:  .  liothyronine (CYTOMEL) 5 MCG tablet, Take 25 mcg by mouth daily. , Disp: , Rfl:  .  meloxicam  (MOBIC) 15 MG tablet, Take 1 tablet (15 mg total) by mouth daily., Disp: 30 tablet, Rfl: 11 .  norethindrone-ethinyl estradiol (MICROGESTIN,JUNEL,LOESTRIN) 1-20 MG-MCG tablet, Take 1 tablet by mouth daily. 28 day supply--NOT 21 DAY, Disp: 3 Package, Rfl: 3 .  SUMAtriptan (IMITREX) 50 MG tablet, Take 1 tablet (50 mg total) every 2 (two) hours as needed by mouth for migraine. May repeat in 2 hours if headache persists or recurs., Disp: 10 tablet, Rfl: 0 .  terbinafine (LAMISIL) 250 MG tablet, TAKE 1 TABLET(250 MG) BY MOUTH DAILY, Disp: 30 tablet, Rfl: 1 .  Testosterone 87.5 MG PLLT, 87.5 mg by Implant route every 4 (four) months., Disp: , Rfl:  .  traZODone (DESYREL) 150 MG tablet, Take 0.5 tablets (75 mg total) by mouth at bedtime., Disp: 45 tablet, Rfl: 11  Review of Systems  Constitutional: Negative for fatigue and fever.  HENT: Negative for congestion, rhinorrhea and sore throat.   Eyes: Negative for pain.  Respiratory: Negative for cough and shortness of breath.   Gastrointestinal: Negative for anorexia, diarrhea and vomiting.  Musculoskeletal: Positive for joint pain.  Skin: Positive for rash. Negative for nail changes.   Social History   Tobacco Use  . Smoking status: Never Smoker  . Smokeless tobacco: Never Used  Substance Use Topics  . Alcohol use: Yes    Alcohol/week: 0.0 standard drinks    Comment: rare- wine    Objective:   BP 114/70   Pulse 83   Temp 98.3 F (36.8 C) (Oral)   Resp 16   Wt 140 lb (63.5 kg)   SpO2 98%   BMI 21.93 kg/m  Vitals:   09/04/18 1059  BP: 114/70  Pulse: 83  Resp: 16  Temp: 98.3 F (36.8 C)  TempSrc: Oral  SpO2: 98%  Weight: 140 lb (63.5 kg)   Physical Exam  Constitutional: She is oriented to person, place, and time. She appears well-developed and well-nourished. No distress.  HENT:  Head: Normocephalic and atraumatic.  Right Ear: Hearing normal.  Left Ear: Hearing normal.  Nose: Nose normal.  No transillumination of the left  maxillary sinus. Turbinates pink and moist. Erythematous 7 mm lesion with 2 mm central yellow scab. No drainage. Slight tender to palpate and puffiness surrounding this lesion on the right cheek below the eye.  Eyes: Conjunctivae and lids are normal. Right eye exhibits no discharge. Left eye exhibits no discharge. No scleral icterus.  Pulmonary/Chest: Effort normal. No respiratory distress.  Musculoskeletal: Normal range of motion.  Neurological: She is alert and oriented to person, place, and time.  Skin: Skin is intact. No lesion and no rash noted.  Psychiatric: She has a normal mood and affect. Her speech is normal and behavior is normal. Thought content normal.      Assessment & Plan:     1. Furuncle of cheek Onset 2 days ago without specific injury.  No fever or drainage. Started with a red area with a central pustule. Will treat with Doxycycline and encouraged to use Hibiclens body wash in the shower. Recheck if no better in 7-10 days. - doxycycline (VIBRA-TABS) 100 MG tablet; Take 1 tablet (100 mg total) by mouth 2 (two) times daily.  Dispense: 20 tablet; Refill: 0  2. Antibiotic-induced yeast infection Requests Diflucan to antibiotic induced yeast infection history.  - fluconazole (DIFLUCAN) 150 MG tablet; Take one tablet by mouth and may repeat in 5 days for antibiotic induced yeast.  Dispense: 2 tablet; Refill: 0  3. Subacute maxillary sinusitis History of nasal septoplasty and antrotomy by Dr. Kathyrn Sheriff (ENT) in 2016. Feeling stuffy and chronic PND with some change in voice at times. Will treat with antibiotic and may continue Flonase, antihistamine and sinus irrigation prn. Recommend she follow up with ENT to rule out polyps. - doxycycline (VIBRA-TABS) 100 MG tablet; Take 1 tablet (100 mg total) by mouth 2 (two) times daily.  Dispense: 20 tablet; Refill: [place], [place]  [facility]

## 2018-09-04 NOTE — Telephone Encounter (Signed)
Pt calledd this am wanting to be seen.  She said she a spider bite or something that came up on her face since Sunday.  She said it is swollen, red and has a pus center and is itching.  She took benadryl and it helped a little.  She said she thinks it could either be a bite or MRSA?  I tried to triage because there  are no appts left today to see what she needs to do.  Please advise Her number is [phone]  Thanks Con Memos

## 2018-09-04 NOTE — Telephone Encounter (Signed)
May be able to see another provider  [name], Dionne Bucy, MD, MPH [facility] 09/04/2018 10:04 AM

## 2018-09-04 NOTE — Telephone Encounter (Signed)
Patient was scheduled at 10:40 with Simona Huh

## 2018-09-17 ENCOUNTER — Other Ambulatory Visit: Payer: Self-pay | Admitting: Family Medicine

## 2018-10-12 ENCOUNTER — Ambulatory Visit: Payer: Self-pay | Admitting: Family Medicine

## 2018-10-12 NOTE — Progress Notes (Deleted)
       Patient: [name] Female    DOB: 18-Jun-1976   42 y.o.   MRN: [id number] Visit Date: 10/12/2018  Today's Provider: Lavon Paganini, MD   No chief complaint on file.  Subjective:    I, [name], CMA, am acting as a Education administrator for Lavon Paganini, MD.   HPI  Follow up for ADD  The patient was last seen for this 3 months ago. Changes made at last visit include patient advised to continue Adderall.  She reports good compliance with treatment. She feels that condition is Improved. She is not having side effects.     Allergies  Allergen Reactions  . Penicillins Swelling    Pt states caused throat to swell   . Cefdinir Palpitations  . Sulfa Antibiotics Other (See Comments)    Lips tingled     Current Outpatient Medications:  .  albuterol (PROVENTIL HFA;VENTOLIN HFA) 108 (90 Base) MCG/ACT inhaler, INHALE 2 PUFFS INTO THE LUNGS EVERY 6 HOURS AS NEEDED FOR WHEEZING OR SHORTNESS OF BREATH, Disp: 8.5 g, Rfl: 2 .  amphetamine-dextroamphetamine (ADDERALL) 15 MG tablet, Take 1 tablet by mouth 2 (two) times daily., Disp: 60 tablet, Rfl: 0 .  amphetamine-dextroamphetamine (ADDERALL) 15 MG tablet, Take 1 tablet by mouth 2 (two) times daily., Disp: 60 tablet, Rfl: 0 .  amphetamine-dextroamphetamine (ADDERALL) 15 MG tablet, Take 1 tablet by mouth 2 (two) times daily., Disp: 60 tablet, Rfl: 0 .  COLLAGEN PO, Take by mouth., Disp: , Rfl:  .  doxycycline (VIBRA-TABS) 100 MG tablet, Take 1 tablet (100 mg total) by mouth 2 (two) times daily., Disp: 20 tablet, Rfl: 0 .  fluconazole (DIFLUCAN) 150 MG tablet, Take one tablet by mouth and may repeat in 5 days for antibiotic induced yeast., Disp: 2 tablet, Rfl: 0 .  liothyronine (CYTOMEL) 5 MCG tablet, Take 25 mcg by mouth daily. , Disp: , Rfl:  .  meloxicam (MOBIC) 15 MG tablet, Take 1 tablet (15 mg total) by mouth daily., Disp: 30 tablet, Rfl: 11 .  norethindrone-ethinyl estradiol (MICROGESTIN,JUNEL,LOESTRIN) 1-20 MG-MCG tablet,  Take 1 tablet by mouth daily. 28 day supply--NOT 21 DAY, Disp: 3 Package, Rfl: 3 .  SUMAtriptan (IMITREX) 50 MG tablet, Take 1 tablet (50 mg total) every 2 (two) hours as needed by mouth for migraine. May repeat in 2 hours if headache persists or recurs., Disp: 10 tablet, Rfl: 0 .  terbinafine (LAMISIL) 250 MG tablet, TAKE 1 TABLET(250 MG) BY MOUTH DAILY, Disp: 30 tablet, Rfl: 0 .  Testosterone 87.5 MG PLLT, 87.5 mg by Implant route every 4 (four) months., Disp: , Rfl:  .  traZODone (DESYREL) 150 MG tablet, Take 0.5 tablets (75 mg total) by mouth at bedtime., Disp: 45 tablet, Rfl: 11  Review of Systems  Constitutional: Negative.   Respiratory: Negative.   Cardiovascular: Negative.   Musculoskeletal: Negative.     Social History   Tobacco Use  . Smoking status: Never Smoker  . Smokeless tobacco: Never Used  Substance Use Topics  . Alcohol use: Yes    Alcohol/week: 0.0 standard drinks    Comment: rare- wine    Objective:   There were no vitals taken for this visit. There were no vitals filed for this visit.   Physical Exam      Assessment & Plan:           Lavon Paganini, MD  [facility]

## 2018-10-16 ENCOUNTER — Ambulatory Visit: Payer: Self-pay | Admitting: Family Medicine

## 2018-10-16 ENCOUNTER — Other Ambulatory Visit: Payer: Self-pay | Admitting: Certified Nurse Midwife

## 2018-10-18 ENCOUNTER — Ambulatory Visit: Payer: Self-pay | Admitting: Family Medicine

## 2018-11-01 DIAGNOSIS — M069 Rheumatoid arthritis, unspecified: Secondary | ICD-10-CM | POA: Insufficient documentation

## 2018-11-02 ENCOUNTER — Ambulatory Visit: Payer: Self-pay | Admitting: Family Medicine

## 2018-11-13 ENCOUNTER — Ambulatory Visit: Payer: Managed Care, Other (non HMO) | Admitting: Family Medicine

## 2018-11-13 ENCOUNTER — Encounter: Payer: Self-pay | Admitting: Family Medicine

## 2018-11-13 VITALS — BP 110/66 | HR 96 | Temp 98.8°F

## 2018-11-13 DIAGNOSIS — F902 Attention-deficit hyperactivity disorder, combined type: Secondary | ICD-10-CM | POA: Diagnosis not present

## 2018-11-13 DIAGNOSIS — G43109 Migraine with aura, not intractable, without status migrainosus: Secondary | ICD-10-CM | POA: Diagnosis not present

## 2018-11-13 MED ORDER — AMPHETAMINE-DEXTROAMPHETAMINE 15 MG PO TABS: 15.0000 mg | ORAL_TABLET | Freq: Two times a day (BID) | ORAL | 0 refills | Status: DC

## 2018-11-13 NOTE — Progress Notes (Signed)
Patient: [name] Female    DOB: July 26, 1976   42 y.o.   MRN: [id number] Visit Date: 11/13/2018  Today's Provider: Lavon Paganini, MD   Chief Complaint  Patient presents with  . ADD   Subjective:    I, Tiburcio Pea, CMA, am acting as a scribe for Lavon Paganini, MD.   HPI    Follow up for ADD  The patient was last seen for this 3 months ago. Changes made at last visit includes continuing Adderall.  She reports good compliance with treatment. She feels that condition is stable. She is not having side effects.    Patient has been having worsening migraines.  Had one about half of the days of last month.  Seeing a new GYN at [facility] (Dr Karle Starch) who increased sumatriptan to 100mg  pills and also Rx'd  naratriptan to use prn.  No aura recently.  No worsening with OCPs.  Hoped that starting these would help with hormonal component.  Has started magnesium and B complex   Allergies  Allergen Reactions  . Penicillins Swelling    Pt states caused throat to swell   . Cefdinir Palpitations  . Sulfa Antibiotics Other (See Comments)    Lips tingled     Current Outpatient Medications:  .  albuterol (PROVENTIL HFA;VENTOLIN HFA) 108 (90 Base) MCG/ACT inhaler, INHALE 2 PUFFS INTO THE LUNGS EVERY 6 HOURS AS NEEDED FOR WHEEZING OR SHORTNESS OF BREATH, Disp: 8.5 g, Rfl: 2 .  amphetamine-dextroamphetamine (ADDERALL) 15 MG tablet, Take 1 tablet by mouth 2 (two) times daily., Disp: 60 tablet, Rfl: 0 .  amphetamine-dextroamphetamine (ADDERALL) 15 MG tablet, Take 1 tablet by mouth 2 (two) times daily., Disp: 60 tablet, Rfl: 0 .  amphetamine-dextroamphetamine (ADDERALL) 15 MG tablet, Take 1 tablet by mouth 2 (two) times daily., Disp: 60 tablet, Rfl: 0 .  Caffeine-Magnesium Salicylate (DIUREX PO), Take by mouth., Disp: , Rfl:  .  COLLAGEN PO, Take by mouth., Disp: , Rfl:  .  liothyronine (CYTOMEL) 5 MCG tablet, Take 25 mcg by mouth daily. , Disp: , Rfl:  .  meloxicam (MOBIC) 15 MG  tablet, Take 1 tablet (15 mg total) by mouth daily., Disp: 30 tablet, Rfl: 11 .  naratriptan (AMERGE) 2.5 MG tablet, Take 2.5 mg by mouth as needed for migraine. Take one (1) tablet at onset of headache; if returns or does not resolve, may repeat after 4 hours; do not exceed five (5) mg in 24 hours., Disp: , Rfl:  .  norethindrone-ethinyl estradiol (MICROGESTIN,JUNEL,LOESTRIN) 1-20 MG-MCG tablet, Take 1 tablet by mouth daily. 28 day supply--NOT 21 DAY, Disp: 3 Package, Rfl: 3 .  ondansetron (ZOFRAN) 4 MG tablet, Take 4 mg by mouth every 8 (eight) hours as needed for nausea or vomiting., Disp: , Rfl:  .  SUMAtriptan (IMITREX) 50 MG tablet, Take 1 tablet (50 mg total) every 2 (two) hours as needed by mouth for migraine. May repeat in 2 hours if headache persists or recurs. (Patient taking differently: Take 100 mg by mouth every 2 (two) hours as needed for migraine. May repeat in 2 hours if headache persists or recurs.), Disp: 10 tablet, Rfl: 0 .  terbinafine (LAMISIL) 250 MG tablet, TAKE 1 TABLET(250 MG) BY MOUTH DAILY, Disp: 30 tablet, Rfl: 0 .  Testosterone 87.5 MG PLLT, 87.5 mg by Implant route every 4 (four) months., Disp: , Rfl:  .  traZODone (DESYREL) 150 MG tablet, Take 0.5 tablets (75 mg total) by mouth at bedtime., Disp: 45  tablet, Rfl: 11   Review of Systems  Constitutional: Negative.   Respiratory: Negative.   Cardiovascular: Negative.   Musculoskeletal: Negative.   Psychiatric/Behavioral: Positive for decreased concentration.    Social History   Tobacco Use  . Smoking status: Never Smoker  . Smokeless tobacco: Never Used  Substance Use Topics  . Alcohol use: Yes    Alcohol/week: 0.0 standard drinks    Comment: rare- wine    Objective:   BP 110/66 (BP Location: Right Arm, Patient Position: Sitting, Cuff Size: Normal)   Pulse 96   Temp 98.8 F (37.1 C) (Oral)   SpO2 98%  Vitals:   11/13/18 1050  BP: 110/66  Pulse: 96  Temp: 98.8 F (37.1 C)  TempSrc: Oral  SpO2: 98%       Physical Exam  Constitutional: She is oriented to person, place, and time. She appears well-developed and well-nourished. No distress.  HENT:  Head: Normocephalic and atraumatic.  Mouth/Throat: Oropharynx is clear and moist.  Eyes: Pupils are equal, round, and reactive to light. Conjunctivae and EOM are normal. No scleral icterus.  Neck: Neck supple. No thyromegaly present.  Cardiovascular: Normal rate, regular rhythm, normal heart sounds and intact distal pulses.  No murmur heard. Pulmonary/Chest: Effort normal and breath sounds normal. No respiratory distress. She has no wheezes. She has no rales.  Musculoskeletal: She exhibits no edema.  Lymphadenopathy:    She has no cervical adenopathy.  Neurological: She is alert and oriented to person, place, and time. No cranial nerve deficit or sensory deficit. Coordination normal.  Skin: Skin is warm and dry. Capillary refill takes less than 2 seconds. No rash noted.  Psychiatric: She has a normal mood and affect. Her behavior is normal.  Vitals reviewed.       Assessment & Plan:   Problem List Items Addressed This Visit      Cardiovascular and Mediastinum   Migraine with aura    Worsening Were previously well controlled with botox injections Can continue magnesium and B complex as this is helpful for prevention Can also add CoQ10 if needed Continue triptan prn - discussed limits Could consider medicaiton for migraine prophylaxis in the future      Relevant Medications   naratriptan (AMERGE) 2.5 MG tablet   Caffeine-Magnesium Salicylate (DIUREX PO)     Other   ADD (attention deficit disorder) - Primary    Stable and well controlled  No medication side effects Continue adderall F/u in 3 months          Return in about 3 months (around 02/12/2019) for CPE.  Approximately 25 minutes was spent in discussion of which greater than 50% was consultation.    The entirety of the information documented in the History of  Present Illness, Review of Systems and Physical Exam were personally obtained by me. Portions of this information were initially documented by Tiburcio Pea, CMA and reviewed by me for thoroughness and accuracy.    Virginia Crews, MD, MPH [facility] 11/14/2018 12:44 PM

## 2018-11-13 NOTE — Patient Instructions (Signed)
Recurrent Migraine Headache °A migraine headache is very bad, throbbing pain that is usually on one side of your head. Recurrent migraines keep coming back (recurring). Talk with your doctor about what things may bring on (trigger) your migraine headaches. °Follow these instructions at home: °Medicines  °· Take over-the-counter and prescription medicines only as told by your doctor. °· Do not drive or use heavy machinery while taking prescription pain medicine. °Lifestyle  °· Do not use any products that contain nicotine or tobacco, such as cigarettes and e-cigarettes. If you need help quitting, ask your doctor. °· Limit alcohol intake to no more than 1 drink a day for nonpregnant women and 2 drinks a day for men. One drink equals 12 oz of beer, 5 oz of wine, or 1½ oz of hard liquor. °· Get 7-9 hours of sleep each night. °· Lessen any stress in your life. Ask your doctor about ways to lower your stress. °· Stay at a healthy weight. Talk with your doctor if you need help losing weight. °· Get regular exercise. °General instructions  °· Keep a journal to find out if certain things bring on migraine headaches. For example, write down: °¨ What you eat and drink. °¨ How much sleep you get. °¨ Any change to your diet or medicines. °· Lie down in a dark, quiet room when you have a migraine. °· Try placing a cool towel over your head when you have a migraine. °· Keep lights dim if bright lights bother you or make your migraines worse. °· Keep all follow-up visits as told by your doctor. This is important. °Contact a doctor if: °· Medicine does not help your migraines. °· Your pain keeps coming back. °· You have a fever. °· You have weight loss without trying. °Get help right away if: °· Your migraine becomes really bad and medicine does not help. °· You have a stiff neck. °· You have trouble seeing. °· Your muscles are weak or you lose control of your muscles. °· You lose your balance or have trouble walking. °· You feel  like you will pass out (faint) or you pass out. °· You have really bad symptoms that are different than your first symptoms. °· You start having sudden, very bad headaches that last for one second or less, like a thunderclap. °Summary °· A migraine headache is very bad, throbbing pain that is usually on one side of your head. °· Talk with your doctor about what things may bring on (trigger) your migraine headaches. °· Take over-the-counter and prescription medicines only as told by your doctor. °· Lie down in a dark, quiet room when you have a migraine. °· Keep a journal about what you eat and drink, how much sleep you get, and any changes to your medicines. This can help you find out if certain things make you have migraine headaches. °This information is not intended to replace advice given to you by your health care provider. Make sure you discuss any questions you have with your health care provider. °Document Released: 09/06/2008 Document Revised: 10/21/2016 Document Reviewed: 10/21/2016 °Elsevier Interactive Patient Education © 2017 [employer] Inc. ° °

## 2018-11-14 NOTE — Assessment & Plan Note (Signed)
Stable and well controlled  No medication side effects Continue adderall F/u in 3 months

## 2018-11-14 NOTE — Assessment & Plan Note (Signed)
Worsening Were previously well controlled with botox injections Can continue magnesium and B complex as this is helpful for prevention Can also add CoQ10 if needed Continue triptan prn - discussed limits Could consider medicaiton for migraine prophylaxis in the future

## 2018-12-18 ENCOUNTER — Encounter: Payer: Self-pay | Admitting: Family Medicine

## 2018-12-18 ENCOUNTER — Ambulatory Visit: Payer: Managed Care, Other (non HMO) | Admitting: Family Medicine

## 2018-12-18 ENCOUNTER — Other Ambulatory Visit: Payer: Self-pay

## 2018-12-18 VITALS — BP 104/72 | HR 89 | Temp 98.1°F | Ht 67.0 in | Wt 141.4 lb

## 2018-12-18 DIAGNOSIS — J029 Acute pharyngitis, unspecified: Secondary | ICD-10-CM

## 2018-12-18 LAB — POCT RAPID STREP A (OFFICE): Rapid Strep A Screen: NEGATIVE

## 2018-12-18 MED ORDER — FLUCONAZOLE 150 MG PO TABS: 150.0000 mg | ORAL_TABLET | Freq: Once | ORAL | 0 refills | Status: AC

## 2018-12-18 MED ORDER — AZITHROMYCIN 250 MG PO TABS: ORAL_TABLET | ORAL | 0 refills | Status: DC

## 2018-12-18 NOTE — Progress Notes (Signed)
  Subjective:     Patient ID: [name], female   DOB: [birth year], 43 y.o.   MRN: [id number] Chief Complaint  Patient presents with  . Sore Throat    feels like strep, swollen, hard to swallow, seen white places in throat, fever 100.1 last night and this morning and has been taking tylenol    HPI Reports hard to swallow and hoarse but no cold sx reported. Daughter has been sick as well.  Review of Systems     Objective:   Physical Exam Constitutional:      General: She is not in acute distress.    Appearance: She is not ill-appearing.  Neurological:     Mental Status: She is alert.   Ears: T.M's intact without inflammation Sinuses Throat:tonsils absent with posterior pharyngeal erythema Neck: Bilateral anterior cervical nodes Lungs: clear     Assessment:    1. Acute pharyngitis, unspecified etiology: rx for Zithromax and Diflucan if culture positive. - Culture, Group A Strep - POCT rapid strep A    Plan:    Discussed salt water gargles and nsaid's. Also expectation of developing further viral sx.

## 2018-12-18 NOTE — Patient Instructions (Signed)
We will call you with the throat culture results. Discussed salt water gargles and ibuprofen for throat discomfort.

## 2018-12-21 LAB — CULTURE, GROUP A STREP: Strep A Culture: NEGATIVE

## 2019-01-04 ENCOUNTER — Other Ambulatory Visit: Payer: Self-pay | Admitting: Family Medicine

## 2019-01-04 MED ORDER — AMPHETAMINE-DEXTROAMPHETAMINE 15 MG PO TABS: 15.0000 mg | ORAL_TABLET | Freq: Two times a day (BID) | ORAL | 0 refills | Status: DC

## 2019-01-04 NOTE — Telephone Encounter (Signed)
Patient called stating her Adderall RX was not at [facility] on [address]    It looks like on her chart the RX failed going through to them.  Can you please resend RX.  Call pt and let her know when done please.

## 2019-01-04 NOTE — Telephone Encounter (Signed)
Patient advised.

## 2019-01-04 NOTE — Telephone Encounter (Signed)
Does appear that only 1 of 3 Rxs from last month transmitted to the pharmacy.  Will send 2 more months Rxs.  Please let patient know to check with pharmacy.

## 2019-01-09 ENCOUNTER — Ambulatory Visit: Payer: Self-pay | Admitting: Adult Health

## 2019-01-09 VITALS — BP 120/60 | HR 87 | Temp 98.2°F | Resp 14

## 2019-01-09 DIAGNOSIS — J320 Chronic maxillary sinusitis: Secondary | ICD-10-CM

## 2019-01-09 MED ORDER — DOXYCYCLINE HYCLATE 100 MG PO TABS: 100.0000 mg | ORAL_TABLET | Freq: Two times a day (BID) | ORAL | 0 refills | Status: DC

## 2019-01-09 MED ORDER — FLUCONAZOLE 150 MG PO TABS: 150.0000 mg | ORAL_TABLET | Freq: Once | ORAL | 0 refills | Status: AC

## 2019-01-09 NOTE — Progress Notes (Addendum)
[facility]  Subjective:     Patient ID: [name], female   DOB: 1976-01-18, 43 y.o.   MRN: [id number]  HPI   Blood pressure 120/60, pulse 87, temperature 98.2 F (36.8 C), temperature source Oral, resp. rate 14, last menstrual period 11/05/2018, SpO2 100 %. Patient is a 43 year old female in no acute distress who comes to the clinic for complaints of facial pressur x 2 days, left side facial pain, sinus headache  She has history of septoplasty she reports  She has an ear nose throat ( ENT) she sees Dr. Farrel Conners ENT regularly.   She has an allergy appointment on 01/10/2019 she reports as recommended by her ENT.   12/18/17 was treated for pharyngitis with Z-pack and  Strep A rapid and culture was negative.   Immunocompromised with history of likely Rheumatoid disease. History of Raynaud's.  Patient  denies any fever, body aches,chills, rash, chest pain, shortness of breath, nausea, vomiting, or diarrhea.    Allergies  Allergen Reactions  . Penicillins Swelling    Pt states caused throat to swell   . Cefdinir Palpitations  . Sulfa Antibiotics Other (See Comments)    Lips tingled   Patient Active Problem List   Diagnosis Date Noted  . Rheumatoid arthritis ([place]) 11/01/2018  . Primary osteoarthritis of both hands 05/11/2018  . Primary osteoarthritis of both feet 05/11/2018  . Raynaud's disease without gangrene 05/11/2018  . Asthma 04/20/2018  . Onychomycosis 04/20/2018  . Family history of rheumatoid arthritis 04/10/2018  . Dry mouth 01/23/2018  . Radicular pain in right arm 10/16/2017  . PTSD (post-traumatic stress disorder) 10/16/2017  . Insomnia 10/16/2017  . Migraine with aura 10/16/2017  . Hypothyroidism   . ADD (attention deficit disorder)   . Uterine leiomyoma 09/22/2017  . 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 History of uterine fibroid 09/22/2017  . Polyarthralgia 12/09/2016    Current Outpatient Medications:  .   albuterol (PROVENTIL HFA;VENTOLIN HFA) 108 (90 Base) MCG/ACT inhaler, INHALE 2 PUFFS INTO THE LUNGS EVERY 6 HOURS AS NEEDED FOR WHEEZING OR SHORTNESS OF BREATH, Disp: 8.5 g, Rfl: 2 .  amphetamine-dextroamphetamine (ADDERALL) 15 MG tablet, Take 1 tablet by mouth 2 (two) times daily., Disp: 60 tablet, Rfl: 0 .  amphetamine-dextroamphetamine (ADDERALL) 15 MG tablet, Take 1 tablet by mouth 2 (two) times daily., Disp: 60 tablet, Rfl: 0 .  amphetamine-dextroamphetamine (ADDERALL) 15 MG tablet, Take 1 tablet by mouth 2 (two) times daily., Disp: 60 tablet, Rfl: 0 .  Caffeine-Magnesium Salicylate (DIUREX PO), Take by mouth., Disp: , Rfl:  .  COLLAGEN PO, Take by mouth., Disp: , Rfl:  .  fluconazole (DIFLUCAN) 150 MG tablet, , Disp: , Rfl:  .  liothyronine (CYTOMEL) 5 MCG tablet, Take 25 mcg by mouth daily. , Disp: , Rfl:  .  magnesium gluconate (MAGONATE) 500 MG tablet, Take 500 mg by mouth 2 (two) times daily., Disp: , Rfl:  .  meloxicam (MOBIC) 15 MG tablet, Take 1 tablet (15 mg total) by mouth daily., Disp: 30 tablet, Rfl: 11 .  MICROGESTIN FE 1/20 1-20 MG-MCG tablet, , Disp: , Rfl:  .  naratriptan (AMERGE) 2.5 MG tablet, Take 2.5 mg by mouth as needed for migraine. Take one (1) tablet at onset of headache; if returns or does not resolve, may repeat after 4 hours; do not exceed five (5) mg in 24 hours., Disp: , Rfl:  .  norethindrone-ethinyl estradiol (MICROGESTIN,JUNEL,LOESTRIN) 1-20 MG-MCG tablet, Take  1 tablet by mouth daily. 28 day supply--NOT 21 DAY, Disp: 3 Package, Rfl: 3 .  ondansetron (ZOFRAN) 4 MG tablet, Take 4 mg by mouth every 8 (eight) hours as needed for nausea or vomiting., Disp: , Rfl:  .  SUMAtriptan (IMITREX) 50 MG tablet, Take 1 tablet (50 mg total) every 2 (two) hours as needed by mouth for migraine. May repeat in 2 hours if headache persists or recurs. (Patient taking differently: Take 100 mg by mouth every 2 (two) hours as needed for migraine. May repeat in 2 hours if headache persists  or recurs.), Disp: 10 tablet, Rfl: 0 .  terbinafine (LAMISIL) 250 MG tablet, TAKE 1 TABLET(250 MG) BY MOUTH DAILY, Disp: 30 tablet, Rfl: 0 .  Testosterone 87.5 MG PLLT, 87.5 mg by Implant route every 4 (four) months., Disp: , Rfl:  .  traZODone (DESYREL) 150 MG tablet, Take 0.5 tablets (75 mg total) by mouth at bedtime., Disp: 45 tablet, Rfl: 11   Review of Systems  Constitutional: Positive for fatigue. Negative for activity change, appetite change, chills, diaphoresis, fever and unexpected weight change.  HENT: Positive for congestion, postnasal drip, rhinorrhea, sinus pressure, sinus pain and sore throat. Negative for dental problem, drooling, ear discharge, ear pain, facial swelling, hearing loss, mouth sores, nosebleeds, sneezing, tinnitus, trouble swallowing and voice change.   Eyes: Negative.   Respiratory: Negative.  Negative for apnea, cough, choking, chest tightness, shortness of breath, wheezing and stridor.   Cardiovascular: Negative.  Negative for chest pain, palpitations and leg swelling.  Gastrointestinal: Negative.   Genitourinary: Negative.   Musculoskeletal: Positive for arthralgias (chronic ) and joint swelling (chronic bilateral hands ). Negative for back pain, gait problem, myalgias, neck pain and neck stiffness.  Skin: Negative.  Negative for color change, pallor and rash.  Allergic/Immunologic: Positive for environmental allergies. Negative for food allergies and immunocompromised state.       She has allergy testing tomorrow Per recommendation of her ENT- she reports she was sent three years ago and has not been able to afford until now.  She is off her antihistamines for allergy testing.   Neurological: Negative.   Hematological: Negative.   Psychiatric/Behavioral: Negative.    She denies any vaginal symptoms currently reports she has antibiotic induced yeast and usually takes repeat dosage of Diflucan to clear.     Objective:   Physical Exam Vitals signs and  nursing note reviewed.  Constitutional:      General: She is not in acute distress.    Appearance: Normal appearance. She is normal weight. She is ill-appearing. She is not toxic-appearing or diaphoretic.     Comments: Patient is alert and oriented and responsive to questions Engages in eye contact with provider. Speaks in full sentences without any pauses without any shortness of breath or distress.    HENT:     Head: Normocephalic and atraumatic.     Jaw: There is normal jaw occlusion.     Salivary Glands: Right salivary gland is not diffusely enlarged or tender. Left salivary gland is not diffusely enlarged or tender.     Right Ear: Hearing, ear canal and external ear normal. A middle ear effusion is present. Tympanic membrane is not erythematous, retracted or bulging.     Left Ear: Hearing, ear canal and external ear normal. A middle ear effusion is present. Tympanic membrane is not erythematous, retracted or bulging.     Nose: Mucosal edema, congestion and rhinorrhea present. No septal deviation or laceration.  Right Sinus: Maxillary sinus tenderness present. No frontal sinus tenderness.     Left Sinus: Maxillary sinus tenderness and frontal sinus tenderness present.     Mouth/Throat:     Lips: Pink.     Mouth: Mucous membranes are moist.     Pharynx: Uvula midline. Posterior oropharyngeal erythema present. No pharyngeal swelling, oropharyngeal exudate or uvula swelling.     Tonsils: Swelling: 0 on the right. 0 on the left.  Eyes:     General: No scleral icterus.       Right eye: No discharge.        Left eye: No discharge.     Extraocular Movements: Extraocular movements intact.     Conjunctiva/sclera: Conjunctivae normal.     Pupils: Pupils are equal, round, and reactive to light.  Neck:     Musculoskeletal: Full passive range of motion without pain, normal range of motion and neck supple. No neck rigidity or muscular tenderness.     Vascular: No carotid bruit.     Trachea:  Trachea and phonation normal.  Cardiovascular:     Rate and Rhythm: Normal rate and regular rhythm.     Heart sounds: No murmur. No friction rub. No gallop.   Pulmonary:     Effort: Pulmonary effort is normal. No respiratory distress.     Breath sounds: Normal breath sounds. No stridor. No wheezing, rhonchi or rales.  Chest:     Chest wall: No tenderness.  Abdominal:     Palpations: Abdomen is soft.  Musculoskeletal: Normal range of motion.     Comments: Patient moves on and off exam table without difficulty. Gait is sure and steady in hall and in room.    Lymphadenopathy:     Cervical: Cervical adenopathy present.     Right cervical: Superficial cervical adenopathy (mild bilaterally. ) present. No deep or posterior cervical adenopathy.    Left cervical: Superficial cervical adenopathy present. No deep or posterior cervical adenopathy.  Skin:    General: Skin is warm and dry.     Capillary Refill: Capillary refill takes less than 2 seconds.     Nails: There is no clubbing.   Neurological:     General: No focal deficit present.     Mental Status: She is alert and oriented to person, place, and time.     Cranial Nerves: Cranial nerves are intact.     Sensory: Sensation is intact.     Motor: Motor function is intact.     Coordination: Coordination is intact.     Gait: Gait is intact.  Psychiatric:        Attention and Perception: Attention normal.        Mood and Affect: Mood normal.        Speech: Speech normal.        Behavior: Behavior normal. Behavior is cooperative.        Thought Content: Thought content normal.        Cognition and Memory: Cognition normal.        Judgment: Judgment normal.        Assessment:     Chronic maxillary sinusitis      Plan:     Meds ordered this encounter  Medications  . doxycycline (VIBRA-TABS) 100 MG tablet    Sig: Take 1 tablet (100 mg total) by mouth 2 (two) times daily.    Dispense:  20 tablet    Refill:  0  . fluconazole  (DIFLUCAN) 150 MG tablet  Sig: Take 1 tablet (150 mg total) by mouth once for 1 dose. May repeat on day 4 if needed. Use only if yeast    Dispense:  2 tablet    Refill:  0   Follow up recommended with Ear Nose and Throat she is established with Dr. Farrel Conners she reports.( recommend follow up after allergy testing)  She uses Flonase daily. She is off antihistamines for her allergy testing scheduled 01/10/19.  She requests Diflucan if yeast develops-she will use and she has a history of yeast( vaginal) induced by antibiotics. She reports that one Diflucan never works. She is advised that provider recommends vaginal cream. She declines anti-fungal Terazol  ceam. She will only use Diflucan if needed for yeast symptoms and will follow up with gynecology if any symptoms persist or do not clear. She is advised of side effects and possible liver damage. She reports she has not had any diflucan since July of 2019.  Patient verbalizes risks versus benefits.   She s advised to follow up with her ear nose and throat physician within 3 weeks given history of chronic sinusitis reported. She is advised with chronic history and repeat infections she should be seen by ENT at least once yearly and more if needed, discussed risks such as sinus malignancy or abscess that can not be ruled out in this office . Advised patient call the office or your primary care doctor for an appointment if no improvement within 72 hours or if any symptoms change or worsen at any time  Advised ER or urgent Care if after hours or on weekend. Call 911 for emergency symptoms at any time.Patinet verbalized understanding of all instructions given/reviewed and treatment plan and has no further questions or concerns at this time.    Patient verbalized understanding of all instructions given and denies any further questions at this time.

## 2019-01-09 NOTE — Patient Instructions (Signed)
Sinusitis, Adult Sinusitis is soreness and swelling (inflammation) of your sinuses. Sinuses are hollow spaces in the bones around your face. They are located:  Around your eyes.  In the middle of your forehead.  Behind your nose.  In your cheekbones. Your sinuses and nasal passages are lined with a fluid called mucus. Mucus drains out of your sinuses. Swelling can trap mucus in your sinuses. This lets germs (bacteria, virus, or fungus) grow, which leads to infection. Most of the time, this condition is caused by a virus. What are the causes? This condition is caused by:  Allergies.  Asthma.  Germs.  Things that block your nose or sinuses.  Growths in the nose (nasal polyps).  Chemicals or irritants in the air.  Fungus (rare). What increases the risk? You are more likely to develop this condition if:  You have a weak body defense system (immune system).  You do a lot of swimming or diving.  You use nasal sprays too much.  You smoke. What are the signs or symptoms? The main symptoms of this condition are pain and a feeling of pressure around the sinuses. Other symptoms include:  Stuffy nose (congestion).  Runny nose (drainage).  Swelling and warmth in the sinuses.  Headache.  Toothache.  A cough that may get worse at night.  Mucus that collects in the throat or the back of the nose (postnasal drip).  Being unable to smell and taste.  Being very tired (fatigue).  A fever.  Sore throat.  Bad breath. How is this diagnosed? This condition is diagnosed based on:  Your symptoms.  Your medical history.  A physical exam.  Tests to find out if your condition is short-term (acute) or long-term (chronic). Your doctor may: ? Check your nose for growths (polyps). ? Check your sinuses using a tool that has a light (endoscope). ? Check for allergies or germs. ? Do imaging tests, such as an MRI or CT scan. How is this treated? Treatment for this condition  depends on the cause and whether it is short-term or long-term.  If caused by a virus, your symptoms should go away on their own within 10 days. You may be given medicines to relieve symptoms. They include: ? Medicines that shrink swollen tissue in the nose. ? Medicines that treat allergies (antihistamines). ? A spray that treats swelling of the nostrils. ? Rinses that help get rid of thick mucus in your nose (nasal saline washes).  If caused by bacteria, your doctor may wait to see if you will get better without treatment. You may be given antibiotic medicine if you have: ? A very bad infection. ? A weak body defense system.  If caused by growths in the nose, you may need to have surgery. Follow these instructions at home: Medicines  Take, use, or apply over-the-counter and prescription medicines only as told by your doctor. These may include nasal sprays.  If you were prescribed an antibiotic medicine, take it as told by your doctor. Do not stop taking the antibiotic even if you start to feel better. Hydrate and humidify   Drink enough water to keep your pee (urine) pale yellow.  Use a cool mist humidifier to keep the humidity level in your home above 50%.  Breathe in steam for 10-15 minutes, 3-4 times a day, or as told by your doctor. You can do this in the bathroom while a hot shower is running.  Try not to spend time in cool or dry air.   Rest  Rest as much as you can.  Sleep with your head raised (elevated).  Make sure you get enough sleep each night. General instructions   Put a warm, moist washcloth on your face 3-4 times a day, or as often as told by your doctor. This will help with discomfort.  Wash your hands often with soap and water. If there is no soap and water, use hand sanitizer.  Do not smoke. Avoid being around people who are smoking (secondhand smoke).  Keep all follow-up visits as told by your doctor. This is important. Contact a doctor if:  You  have a fever.  Your symptoms get worse.  Your symptoms do not get better within 10 days. Get help right away if:  You have a very bad headache.  You cannot stop throwing up (vomiting).  You have very bad pain or swelling around your face or eyes.  You have trouble seeing.  You feel confused.  Your neck is stiff.  You have trouble breathing. Summary  Sinusitis is swelling of your sinuses. Sinuses are hollow spaces in the bones around your face.  This condition is caused by tissues in your nose that become inflamed or swollen. This traps germs. These can lead to infection.  If you were prescribed an antibiotic medicine, take it as told by your doctor. Do not stop taking it even if you start to feel better.  Keep all follow-up visits as told by your doctor. This is important. This information is not intended to replace advice given to you by your health care provider. Make sure you discuss any questions you have with your health care provider. Document Released: 05/16/2008 Document Revised: 04/30/2018 Document Reviewed: 04/30/2018 Elsevier Interactive Patient Education  2019 [place]. Doxycycline tablets or capsules What is this medicine? DOXYCYCLINE (dox i SYE kleen) is a tetracycline antibiotic. It kills certain bacteria or stops their growth. It is used to treat many kinds of infections, like dental, skin, respiratory, and urinary tract infections. It also treats acne, Lyme disease, malaria, and certain sexually transmitted infections. This medicine may be used for other purposes; ask your health care provider or pharmacist if you have questions. COMMON BRAND NAME(S): Acticlate, Adoxa, Adoxa CK, Adoxa Pak, Adoxa TT, Alodox, Avidoxy, Doxal, LYMEPAK, Mondoxyne NL, Monodox, Morgidox 1x, Morgidox 1x Kit, Morgidox 2x, Morgidox 2x Kit, NutriDox, Ocudox, TARGADOX, Vibra-Tabs, Vibramycin What should I tell my health care provider before I take this medicine? They need to know if  you have any of these conditions: -liver disease -long exposure to sunlight like working outdoors -stomach problems like colitis -an unusual or allergic reaction to doxycycline, tetracycline antibiotics, other medicines, foods, dyes, or preservatives -pregnant or trying to get pregnant -breast-feeding How should I use this medicine? Take this medicine by mouth with a full glass of water. Follow the directions on the prescription label. It is best to take this medicine without food, but if it upsets your stomach take it with food. Take your medicine at regular intervals. Do not take your medicine more often than directed. Take all of your medicine as directed even if you think you are better. Do not skip doses or stop your medicine early. Talk to your pediatrician regarding the use of this medicine in children. While this drug may be prescribed for selected conditions, precautions do apply. Overdosage: If you think you have taken too much of this medicine contact a poison control center or emergency room at once. NOTE: This medicine is only for  you. Do not share this medicine with others. What if I miss a dose? If you miss a dose, take it as soon as you can. If it is almost time for your next dose, take only that dose. Do not take double or extra doses. What may interact with this medicine? -antacids -barbiturates -birth control pills -bismuth subsalicylate -carbamazepine -methoxyflurane -other antibiotics -phenytoin -vitamins that contain iron -warfarin This list may not describe all possible interactions. Give your health care provider a list of all the medicines, herbs, non-prescription drugs, or dietary supplements you use. Also tell them if you smoke, drink alcohol, or use illegal drugs. Some items may interact with your medicine. What should I watch for while using this medicine? Tell your doctor or health care professional if your symptoms do not improve. Do not treat diarrhea with  over the counter products. Contact your doctor if you have diarrhea that lasts more than 2 days or if it is severe and watery. Do not take this medicine just before going to bed. It may not dissolve properly when you lay down and can cause pain in your throat. Drink plenty of fluids while taking this medicine to also help reduce irritation in your throat. This medicine can make you more sensitive to the sun. Keep out of the sun. If you cannot avoid being in the sun, wear protective clothing and use sunscreen. Do not use sun lamps or tanning beds/booths. Birth control pills may not work properly while you are taking this medicine. Talk to your doctor about using an extra method of birth control. If you are being treated for a sexually transmitted infection, avoid sexual contact until you have finished your treatment. Your sexual partner may also need treatment. Avoid antacids, aluminum, calcium, magnesium, and iron products for 4 hours before and 2 hours after taking a dose of this medicine. If you are using this medicine to prevent malaria, you should still protect yourself from contact with mosquitos. Stay in screened-in areas, use mosquito nets, keep your body covered, and use an insect repellent. What side effects may I notice from receiving this medicine? Side effects that you should report to your doctor or health care professional as soon as possible: -allergic reactions like skin rash, itching or hives, swelling of the face, lips, or tongue -difficulty breathing -fever -itching in the rectal or genital area -pain on swallowing -redness, blistering, peeling or loosening of the skin, including inside the mouth -severe stomach pain or cramps -unusual bleeding or bruising -unusually weak or tired -yellowing of the eyes or skin Side effects that usually do not require medical attention (report to your doctor or health care professional if they continue or are bothersome): -diarrhea -loss of  appetite -nausea, vomiting This list may not describe all possible side effects. Call your doctor for medical advice about side effects. You may report side effects to FDA at 1-800-FDA-1088. Where should I keep my medicine? Keep out of the reach of children. Store at room temperature, below 30 degrees C (86 degrees F). Protect from light. Keep container tightly closed. Throw away any unused medicine after the expiration date. Taking this medicine after the expiration date can make you seriously ill. NOTE: This sheet is a summary. It may not cover all possible information. If you have questions about this medicine, talk to your doctor, pharmacist, or health care provider.  2019 Elsevier/[employer] Standard (2015-12-30 17:11:22) Fluconazole tablets What is this medicine? FLUCONAZOLE (floo KON na zole) is an antifungal medicine. It is  used to treat certain kinds of fungal or yeast infections. This medicine may be used for other purposes; ask your health care provider or pharmacist if you have questions. COMMON BRAND NAME(S): Diflucan What should I tell my health care provider before I take this medicine? They need to know if you have any of these conditions: -history of irregular heart beat -kidney disease -an unusual or allergic reaction to fluconazole, other azole antifungals, medicines, foods, dyes, or preservatives -pregnant or trying to get pregnant -breast-feeding How should I use this medicine? Take this medicine by mouth. Follow the directions on the prescription label. Do not take your medicine more often than directed. Talk to your pediatrician regarding the use of this medicine in children. Special care may be needed. This medicine has been used in children as young as 76 months of age. Overdosage: If you think you have taken too much of this medicine contact a poison control center or emergency room at once. NOTE: This medicine is only for you. Do not share this medicine with others. What  if I miss a dose? If you miss a dose, take it as soon as you can. If it is almost time for your next dose, take only that dose. Do not take double or extra doses. What may interact with this medicine? Do not take this medicine with any of the following medications: -astemizole -certain medicines for irregular heart beat like dofetilide, dronedarone, quinidine -cisapride -erythromycin -lomitapide -other medicines that prolong the QT interval (cause an abnormal heart rhythm) -pimozide -terfenadine -thioridazine -tolvaptan -ziprasidone This medicine may also interact with the following medications: -antiviral medicines for HIV or AIDS -birth control pills -certain antibiotics like rifabutin, rifampin -certain medicines for blood pressure like amlodipine, isradipine, felodipine, hydrochlorothiazide, losartan, nifedipine -certain medicines for cancer like cyclophosphamide, vinblastine, vincristine -certain medicines for cholesterol like atorvastatin, lovastatin, fluvastatin, simvastatin -certain medicines for depression, anxiety, or psychotic disturbances like amitriptyline, midazolam, nortriptyline, triazolam -certain medicines for diabetes like glipizide, glyburide, tolbutamide -certain medicines for pain like alfentanil, fentanyl, methadone -certain medicines for seizures like carbamazepine, phenytoin -certain medicines that treat or prevent blood clots like warfarin -halofantrine -medicines that lower your chance of fighting infection like cyclosporine, prednisone, tacrolimus -NSAIDS, medicines for pain and inflammation, like celecoxib, diclofenac, flurbiprofen, ibuprofen, meloxicam, naproxen -other medicines for fungal infections -sirolimus -theophylline -tofacitinib This list may not describe all possible interactions. Give your health care provider a list of all the medicines, herbs, non-prescription drugs, or dietary supplements you use. Also tell them if you smoke, drink  alcohol, or use illegal drugs. Some items may interact with your medicine. What should I watch for while using this medicine? Visit your doctor or health care professional for regular checkups. If you are taking this medicine for a long time you may need blood work. Tell your doctor if your symptoms do not improve. Some fungal infections need many weeks or months of treatment to cure. Alcohol can increase possible damage to your liver. Avoid alcoholic drinks. If you have a vaginal infection, do not have sex until you have finished your treatment. You can wear a sanitary napkin. Do not use tampons. Wear freshly washed cotton, not synthetic, panties. What side effects may I notice from receiving this medicine? Side effects that you should report to your doctor or health care professional as soon as possible: -allergic reactions like skin rash or itching, hives, swelling of the lips, mouth, tongue, or throat -dark urine -feeling dizzy or faint -irregular heartbeat or chest pain -redness,  blistering, peeling or loosening of the skin, including inside the mouth -trouble breathing -unusual bruising or bleeding -vomiting -yellowing of the eyes or skin Side effects that usually do not require medical attention (report to your doctor or health care professional if they continue or are bothersome): -changes in how food tastes -diarrhea -headache -stomach upset or nausea This list may not describe all possible side effects. Call your doctor for medical advice about side effects. You may report side effects to FDA at 1-800-FDA-1088. Where should I keep my medicine? Keep out of the reach of children. Store at room temperature below 30 degrees C (86 degrees F). Throw away any medicine after the expiration date. NOTE: This sheet is a summary. It may not cover all possible information. If you have questions about this medicine, talk to your doctor, pharmacist, or health care provider.  2019 Elsevier/Gold  Standard (2013-07-06 19:37:38)

## 2019-01-11 ENCOUNTER — Other Ambulatory Visit: Payer: Self-pay | Admitting: Certified Nurse Midwife

## 2019-01-13 ENCOUNTER — Other Ambulatory Visit: Payer: Self-pay | Admitting: Family Medicine

## 2019-01-22 ENCOUNTER — Ambulatory Visit (INDEPENDENT_AMBULATORY_CARE_PROVIDER_SITE_OTHER): Payer: Managed Care, Other (non HMO) | Admitting: Podiatry

## 2019-01-22 ENCOUNTER — Encounter: Payer: Self-pay | Admitting: Podiatry

## 2019-01-22 DIAGNOSIS — L600 Ingrowing nail: Secondary | ICD-10-CM | POA: Diagnosis not present

## 2019-01-23 ENCOUNTER — Ambulatory Visit: Payer: Self-pay | Admitting: Adult Health

## 2019-01-23 ENCOUNTER — Other Ambulatory Visit: Payer: Self-pay

## 2019-01-23 VITALS — BP 118/60 | HR 75 | Temp 98.4°F | Resp 14

## 2019-01-23 DIAGNOSIS — J320 Chronic maxillary sinusitis: Secondary | ICD-10-CM

## 2019-01-23 DIAGNOSIS — J3489 Other specified disorders of nose and nasal sinuses: Secondary | ICD-10-CM

## 2019-01-23 MED ORDER — LEVOFLOXACIN 500 MG PO TABS: 500.0000 mg | ORAL_TABLET | Freq: Every day | ORAL | 0 refills | Status: DC

## 2019-01-23 NOTE — Patient Instructions (Signed)
Please follow up with your ENT on 2/17 sooner if needed.  Advised patient call the office or your primary care doctor for an appointment if no improvement within 72 hours or if any symptoms change or worsen at any time  Advised ER or urgent Care if after hours or on weekend. Call 911 for emergency symptoms at any time.Patinet verbalized understanding of all instructions given/reviewed and treatment plan and has no further questions or concerns at this time.    Levofloxacin tablets What is this medicine? LEVOFLOXACIN (lee voe FLOX a sin) is a quinolone antibiotic. It is used to treat certain kinds of bacterial infections. It will not work for colds, flu, or other viral infections. This medicine may be used for other purposes; ask your health care provider or pharmacist if you have questions. COMMON BRAND NAME(S): Levaquin, Levaquin Leva-Pak What should I tell my health care provider before I take this medicine? They need to know if you have any of these conditions: -bone problems -diabetes -heart disease -high blood pressure -history of irregular heartbeat -history of low levels of potassium in the blood -joint problems -kidney disease -liver disease -mental illness -myasthenia gravis -seizures -tendon problems -tingling of the fingers or toes, or other nerve disorder -an unusual or allergic reaction to levofloxacin, other quinolone antibiotics, foods, dyes, or preservatives -pregnant or trying to get pregnant -breast-feeding How should I use this medicine? Take this medicine by mouth with a full glass of water. Follow the directions on the prescription label. You can take it with or without food. If it upsets your stomach, take it with food. Take your medicine at regular intervals. Do not take your medicine more often than directed. Take all of your medicine as directed even if you think you are better. Do not skip doses or stop your medicine early. Avoid antacids, calcium, iron, and  zinc products for 2 hours before and 2 hours after taking a dose of this medicine. A special MedGuide will be given to you by the pharmacist with each prescription and refill. Be sure to read this information carefully each time. Talk to your pediatrician regarding the use of this medicine in children. While this drug may be prescribed for children as young as 6 months for selected conditions, precautions do apply. Overdosage: If you think you have taken too much of this medicine contact a poison control center or emergency room at once. NOTE: This medicine is only for you. Do not share this medicine with others. What if I miss a dose? If you miss a dose, take it as soon as you remember. If it is almost time for your next dose, take only that dose. Do not take double or extra doses. What may interact with this medicine? Do not take this medicine with any of the following medications: -cisapride -dofetilide -dronedarone -pimozide -thioridazine -ziprasidone This medicine may also interact with the following medications: -antacids -birth control pills -certain medicines for diabetes, like glipizide, glyburide, or insulin -certain medicines that treat or prevent blood clots like warfarin -didanosine buffered tablets or powder -multivitamins -NSAIDS, medicines for pain and inflammation, like ibuprofen or naproxen -other medicines that prolong the QT interval (cause an abnormal heart rhythm) -steroid medicines like prednisone or cortisone -sucralfate -theophylline This list may not describe all possible interactions. Give your health care provider a list of all the medicines, herbs, non-prescription drugs, or dietary supplements you use. Also tell them if you smoke, drink alcohol, or use illegal drugs. Some items may interact with  your medicine. What should I watch for while using this medicine? Tell your doctor or healthcare professional if your symptoms do not start to get better or if they  get worse. Do not treat diarrhea with over the counter products. Contact your doctor if you have diarrhea that lasts more than 2 days or if it is severe and watery. Check with your doctor or health care professional if you get an attack of severe diarrhea, nausea and vomiting, or if you sweat a lot. The loss of too much body fluid can make it dangerous for you to take this medicine. This medicine may affect blood sugar levels. If you have diabetes, check with your doctor or health care professional before you change your diet or the dose of your diabetic medicine. You may get drowsy or dizzy. Do not drive, use machinery, or do anything that needs mental alertness until you know how this medicine affects you. Do not sit or stand up quickly, especially if you are an older patient. This reduces the risk of dizzy or fainting spells. This medicine can make you more sensitive to the sun. Keep out of the sun. If you cannot avoid being in the sun, wear protective clothing and use a sunscreen. Do not use sun lamps or tanning beds/booths. What side effects may I notice from receiving this medicine? Side effects that you should report to your doctor or health care professional as soon as possible: -allergic reactions like skin rash or hives, swelling of the face, lips, or tongue -anxious -bloody or watery diarrhea -breathing problems -confusion -depressed mood -fast, irregular heartbeat -fever -hallucination, loss of contact with reality -joint, muscle, or tendon pain or swelling -loss of memory -muscle weakness -pain, tingling, numbness in the hands or feet -seizures -signs and symptoms of aortic dissection such as sudden chest, stomach, or back pain -signs and symptoms of high blood sugar such as dizziness; dry mouth; dry skin; fruity breath; nausea; stomach pain; increased hunger or thirst; increased urination -signs and symptoms of liver injury like dark yellow or brown urine; general ill feeling or  flu-like symptoms; light-colored stools; loss of appetite; nausea; right upper belly pain; unusually weak or tired; yellowing of the eyes or skin -signs and symptoms of low blood sugar such as feeling anxious; confusion; dizziness; increased hunger; unusually weak or tired; sweating; shakiness; cold; irritable; headache; blurred vision; fast heartbeat; loss of consciousness; pale skin -suicidal thoughts or other mood changes -sunburn -unusually weak or tired Side effects that usually do not require medical attention (report to your doctor or health care professional if they continue or are bothersome): -constipation -dry mouth -headache -nausea, vomiting -trouble sleeping This list may not describe all possible side effects. Call your doctor for medical advice about side effects. You may report side effects to FDA at 1-800-FDA-1088. Where should I keep my medicine? Keep out of the reach of children. Store at room temperature between 15 and 30 degrees C (59 and 86 degrees F). Keep in a tightly closed container. Throw away any unused medicine after the expiration date. NOTE: This sheet is a summary. It may not cover all possible information. If you have questions about this medicine, talk to your doctor, pharmacist, or health care provider.  2019 Elsevier/Gold Standard (2018-06-12 17:41:46)  Sinusitis, Adult Sinusitis is soreness and swelling (inflammation) of your sinuses. Sinuses are hollow spaces in the bones around your face. They are located:  Around your eyes.  In the middle of your forehead.  Behind your  nose.  In your cheekbones. Your sinuses and nasal passages are lined with a fluid called mucus. Mucus drains out of your sinuses. Swelling can trap mucus in your sinuses. This lets germs (bacteria, virus, or fungus) grow, which leads to infection. Most of the time, this condition is caused by a virus. What are the causes? This condition is caused  by:  Allergies.  Asthma.  Germs.  Things that block your nose or sinuses.  Growths in the nose (nasal polyps).  Chemicals or irritants in the air.  Fungus (rare). What increases the risk? You are more likely to develop this condition if:  You have a weak body defense system (immune system).  You do a lot of swimming or diving.  You use nasal sprays too much.  You smoke. What are the signs or symptoms? The main symptoms of this condition are pain and a feeling of pressure around the sinuses. Other symptoms include:  Stuffy nose (congestion).  Runny nose (drainage).  Swelling and warmth in the sinuses.  Headache.  Toothache.  A cough that may get worse at night.  Mucus that collects in the throat or the back of the nose (postnasal drip).  Being unable to smell and taste.  Being very tired (fatigue).  A fever.  Sore throat.  Bad breath. How is this diagnosed? This condition is diagnosed based on:  Your symptoms.  Your medical history.  A physical exam.  Tests to find out if your condition is short-term (acute) or long-term (chronic). Your doctor may: ? Check your nose for growths (polyps). ? Check your sinuses using a tool that has a light (endoscope). ? Check for allergies or germs. ? Do imaging tests, such as an MRI or CT scan. How is this treated? Treatment for this condition depends on the cause and whether it is short-term or long-term.  If caused by a virus, your symptoms should go away on their own within 10 days. You may be given medicines to relieve symptoms. They include: ? Medicines that shrink swollen tissue in the nose. ? Medicines that treat allergies (antihistamines). ? A spray that treats swelling of the nostrils. ? Rinses that help get rid of thick mucus in your nose (nasal saline washes).  If caused by bacteria, your doctor may wait to see if you will get better without treatment. You may be given antibiotic medicine if you  have: ? A very bad infection. ? A weak body defense system.  If caused by growths in the nose, you may need to have surgery. Follow these instructions at home: Medicines  Take, use, or apply over-the-counter and prescription medicines only as told by your doctor. These may include nasal sprays.  If you were prescribed an antibiotic medicine, take it as told by your doctor. Do not stop taking the antibiotic even if you start to feel better. Hydrate and humidify   Drink enough water to keep your pee (urine) pale yellow.  Use a cool mist humidifier to keep the humidity level in your home above 50%.  Breathe in steam for 10-15 minutes, 3-4 times a day, or as told by your doctor. You can do this in the bathroom while a hot shower is running.  Try not to spend time in cool or dry air. Rest  Rest as much as you can.  Sleep with your head raised (elevated).  Make sure you get enough sleep each night. General instructions   Put a warm, moist washcloth on your face 3-4  times a day, or as often as told by your doctor. This will help with discomfort.  Wash your hands often with soap and water. If there is no soap and water, use hand sanitizer.  Do not smoke. Avoid being around people who are smoking (secondhand smoke).  Keep all follow-up visits as told by your doctor. This is important. Contact a doctor if:  You have a fever.  Your symptoms get worse.  Your symptoms do not get better within 10 days. Get help right away if:  You have a very bad headache.  You cannot stop throwing up (vomiting).  You have very bad pain or swelling around your face or eyes.  You have trouble seeing.  You feel confused.  Your neck is stiff.  You have trouble breathing. Summary  Sinusitis is swelling of your sinuses. Sinuses are hollow spaces in the bones around your face.  This condition is caused by tissues in your nose that become inflamed or swollen. This traps germs. These can  lead to infection.  If you were prescribed an antibiotic medicine, take it as told by your doctor. Do not stop taking it even if you start to feel better.  Keep all follow-up visits as told by your doctor. This is important. This information is not intended to replace advice given to you by your health care provider. Make sure you discuss any questions you have with your health care provider. Document Released: 05/16/2008 Document Revised: 04/30/2018 Document Reviewed: 04/30/2018 Elsevier Interactive Patient Education  2019 [employer].

## 2019-01-23 NOTE — Progress Notes (Addendum)
[facility]  Subjective:     Patient ID: [name], female   DOB: October 26, 1976, 43 y.o.   MRN: [id number]  HPI  Blood pressure 118/60, pulse 75, temperature 98.4 F (36.9 C), temperature source Oral, resp. rate 14, SpO2 99 %. Allergies  Allergen Reactions  . Penicillins Swelling    Pt states caused throat to swell   . Cefdinir Palpitations  . Sulfa Antibiotics Other (See Comments)    Lips tingled    Patient is a 43 year old female in no acute distress who comes to clinic for complaint of sinusitis symptoms that will not resolve since being seen last visit 01/09/18. She started feeling better on Doxycycline then right sided nasal / maxillary pressure started and left sided maxillary sinus pressure returns and has generalized teeth pain upper and lower. Onset of symptoms 1/27/20419. She has had one right sided nose bleed that stopped easily within 1 minute. Denies any trauma.  Left and right ear pain.  She reports green brown/nasal discharge.  She has had mild low grade fevers over the weekend.  She has history of septoplasty she reports  She has an ear nose throat ( ENT) she sees Dr. Farrel Conners ENT regularly. No LMP recorded. Patient is perimenopausal.  Last period 11/05/18. She denies any chance of pregnancy.   She was to have allergy testing on 1/30 and did have this she reports.  She reports she was not allergic to anything.   Immunocompromised with history of likely history per patient of  Rheumatoid disease. History of Raynaud's. She has seen rheumatology though no definitive diagnosis found.  Patient  denies any body aches,chills, rash, chest pain, shortness of breath, nausea, vomiting, or diarrhea   Review of Systems  Constitutional: Positive for fatigue. Negative for activity change, appetite change, chills, diaphoresis, fever and unexpected weight change.  HENT: Positive for congestion, postnasal drip, rhinorrhea, sinus pressure  (maxillary ) and sinus pain. Negative for dental problem, drooling, ear discharge, ear pain, facial swelling, hearing loss, mouth sores, nosebleeds, sneezing, sore throat, tinnitus, trouble swallowing and voice change.        Dental pain upper and lower   Eyes: Negative.   Respiratory: Negative.  Negative for apnea, cough, choking, chest tightness, shortness of breath, wheezing and stridor.   Cardiovascular: Negative.  Negative for chest pain, palpitations and leg swelling.  Gastrointestinal: Negative.   Genitourinary: Positive for menstrual problem (no menstraul since 11/10/19 reports gynecologist told her she is in perimenopause. She reports she has not had any intercourse in siix months. Denies chance of pregnancy and declined pregnancy test. ). Negative for decreased urine volume, difficulty urinating, dyspareunia, dysuria, enuresis, flank pain, frequency, genital sores, hematuria, pelvic pain, urgency, vaginal bleeding, vaginal discharge and vaginal pain.  Musculoskeletal: Positive for arthralgias (chronic ) and joint swelling (chronic bilateral hands ). Negative for back pain, gait problem, myalgias, neck pain and neck stiffness.       Sees rheumatology   Skin: Negative.  Negative for color change, pallor, rash and wound.  Allergic/Immunologic: Positive for environmental allergies. Negative for food allergies and immunocompromised state.       She was off antihistamines until 1/31 for allergy testing she had on 1/30 she reports she had no allergies with skin testing and is scheduled to see ENT 01/28/19 for follow up.   Neurological: Negative.   Hematological: Negative.   Psychiatric/Behavioral: Negative.        Objective:   Physical Exam Vitals  signs and nursing note reviewed.  Constitutional:      General: She is awake. She is not in acute distress.    Appearance: Normal appearance. She is well-groomed and normal weight. She is ill-appearing. She is not toxic-appearing or diaphoretic.      Comments: Patient is alert and oriented and responsive to questions Engages in eye contact with provider. Speaks in full sentences without any pauses without any shortness of breath or distress.  Thin body habitus.    HENT:     Head: Normocephalic and atraumatic.     Jaw: There is normal jaw occlusion.     Salivary Glands: Right salivary gland is not diffusely enlarged or tender. Left salivary gland is not diffusely enlarged or tender.     Right Ear: Hearing, ear canal and external ear normal. A middle ear effusion (brown fluid behind bilateral tympanic membranes ) is present. Tympanic membrane is not erythematous, retracted or bulging.     Left Ear: Hearing, ear canal and external ear normal. A middle ear effusion is present. Tympanic membrane is not erythematous, retracted or bulging.     Nose: Mucosal edema, congestion and rhinorrhea present. No septal deviation or laceration.     Right Sinus: Maxillary sinus tenderness and frontal sinus tenderness present.     Left Sinus: Maxillary sinus tenderness and frontal sinus tenderness present.     Comments: Yellow/ green post nasal drip visualized on oral exam.  Dentition appears normal.     Mouth/Throat:     Lips: Pink.     Mouth: Mucous membranes are moist.     Pharynx: Oropharynx is clear. Uvula midline. No pharyngeal swelling, oropharyngeal exudate, posterior oropharyngeal erythema or uvula swelling.     Tonsils: No tonsillar exudate. Swelling: 0 on the right. 0 on the left.  Eyes:     General: No scleral icterus.       Right eye: No discharge.        Left eye: No discharge.     Extraocular Movements: Extraocular movements intact.     Conjunctiva/sclera: Conjunctivae normal.     Pupils: Pupils are equal, round, and reactive to light.  Neck:     Musculoskeletal: Full passive range of motion without pain, normal range of motion and neck supple. No neck rigidity or muscular tenderness.     Vascular: No carotid bruit.     Trachea: Trachea  and phonation normal.  Cardiovascular:     Rate and Rhythm: Normal rate and regular rhythm.     Heart sounds: No murmur. No friction rub. No gallop.   Pulmonary:     Effort: Pulmonary effort is normal. No accessory muscle usage, prolonged expiration or respiratory distress.     Breath sounds: Normal breath sounds and air entry. No stridor. No decreased breath sounds, wheezing, rhonchi or rales.     Comments: No adventitious sounds auscultated.  Chest:     Chest wall: No tenderness.  Abdominal:     General: Abdomen is flat. There is no distension.     Palpations: Abdomen is soft.  Musculoskeletal: Normal range of motion.     Comments: Patient moves on and off exam table without difficulty. Gait is sure and steady in hall and in room.    Lymphadenopathy:     Cervical: Cervical adenopathy present.     Right cervical: Superficial cervical adenopathy (mild bilaterally, mildy tender, mobile, soft ) present. No deep or posterior cervical adenopathy.    Left cervical: Superficial cervical adenopathy present. No  deep or posterior cervical adenopathy.  Skin:    General: Skin is warm and dry.     Capillary Refill: Capillary refill takes less than 2 seconds.     Nails: There is no clubbing.   Neurological:     General: No focal deficit present.     Mental Status: She is alert and oriented to person, place, and time.     Cranial Nerves: Cranial nerves are intact.     Sensory: Sensation is intact.     Motor: Motor function is intact.     Coordination: Coordination is intact.     Gait: Gait is intact.  Psychiatric:        Attention and Perception: Attention normal.        Mood and Affect: Mood normal.        Speech: Speech normal.        Behavior: Behavior normal. Behavior is cooperative.        Thought Content: Thought content normal.        Cognition and Memory: Cognition normal.        Judgment: Judgment normal.        Assessment:     Chronic maxillary sinusitis - Plan: Upper  Respiratory Culture, Routine, CANCELED: POCT urine pregnancy  Purulent nasal discharge - Plan: CANCELED: POCT Influenza A/B (POC66), CANCELED: POCT urine pregnancy    Orders Placed This Encounter  Procedures  . Upper Respiratory Culture, Routine  . POCT Influenza A/B (POC66)       Plan:     Patient was recommended to follow up with her PCP but could not get an appointment for today. Provider called Dr.Juengel.  She has an appointment scheduled for earliest available time per receptionist. Appt is 2/17. Patient is advised she needs to keep this appointment and schedule regular appointments with specialist until all chronic issues are under control.   Will treat for possible resistant bacteria given physical exam and ROS findings.   Meds ordered this encounter  Medications  . levofloxacin (LEVAQUIN) 500 MG tablet    Sig: Take 1 tablet (500 mg total) by mouth daily.    Dispense:  7 tablet    Refill:  0     Discussed at length with patient Levaquin blackbox warning of tendinitis and signs and symptoms of tendinitis or tendon rupture.  Patient verbalized understanding  Patient declined recommended pregnancy test, she understood risks with medication being given if she possibly could be pregnant  and she denied any intercourse in the last 6 months.  She reported she had seen her gynecologist and is experiencing perimenopause and had already been pregnancy tested. This test was negative per patient.  Patient is aware of the side effects Levaquin can cause if she were pregnant, however she is adamant that she is not pregnant and has seen her gynecologist and is in perimenopause and chooses to proceed with Levaquin without recommended and ordered pregnancy test by provider.  Patient also requested a Diflucan be sent in, as she reports she always develops  yeast with antibiotic use.  Provider discussed dangers of overuse of Diflucan and that Diflucan would not be prescribed in this clinic.   She recently took Diflucan 1 tablet and repeated additional tablet  four days apart  since last visit for yeast that she developed.  Patient understood risks versus benefits and will see her gynecologist if symptoms of yeast develop. She reports all previous symptoms have cleared and she denies any vaginal or yeast symptoms at  this time.  Patient understands that diagnosis of chronic maxillary sinusitis needs to be evaluated by specialist and treated by ear nose and throat specialist.  She understands that malignancy, nasal polyps, or other etiology cannot be ruled out in this clinic and that if she does not follow-up with specialist at worst death could occur.  She agrees to follow-up with her ear nose and throat doctor. She is advised that this acute care clinic is for mild acute care issues and that a higher level of care is needed for chronic issues.  No further available testing is available for patient in this clinic and she must follow-up with specialist or her PCP.Patient verbalizes understanding. She is advised to continue to use her allergy medications and Flonase as prescribed by her ear nose and throat physician and to remain on her antihistamine, she was off of it for allergy testing in this clinic flare could be worsening due to restarting medication and being off of it for a while. She is advised to keep her appointment with her ear nose and throat and to call her ear nose or throat doctor if symptoms do not start to improve within 2 to 3 days or if any symptoms worsen at any time she will call her specialist or her primary care physician. She also will seek after-hours care as recommended below and continue to see her primary care physician for routine lab work and recommended follow-ups as they recommend.  Gave and reviewed After Visit Summary(AVS) with patient. Patient is advised to read the after visit summary as well and let the provider know if any question, concerns or clarifications  are needed.   Advised patient call the office or your primary care doctor for an appointment if no improvement within 72 hours or if any symptoms change or worsen at any time  Advised ER or urgent Care if after hours or on weekend. Call 911 for emergency symptoms at any time.Patinet verbalized understanding of all instructions given/reviewed and treatment plan and has no further questions or concerns at this time.    Provider thoroughly discussed in collaboration above plan with supervising physician Dr. Miguel Aschoff who is in agreement with the care plan as above.

## 2019-01-25 ENCOUNTER — Telehealth: Payer: Self-pay

## 2019-01-25 NOTE — Telephone Encounter (Signed)
Called and spoke with patient. She stated that she is feeling a small improvement since her visit but is still not fully better and will keep her ENT appointment on 01/28/19 and F/U with her PCP. She is taking the medication prescribed at her visit as directed.

## 2019-01-25 NOTE — Telephone Encounter (Signed)
Thanks so much. 

## 2019-01-28 NOTE — Progress Notes (Signed)
   Subjective: Patient presents today for evaluation of pain to the medial border of the left hallux that began 2-3 months ago. Patient is concerned for possible ingrown nail. Touching and wearing shoes increases the pain. She has been "digging the nail out" for treatment. Patient presents today for further treatment and evaluation.  Past Medical History:  Diagnosis Date  . ADD (attention deficit disorder)   . Bone spur    "front of spine" - effects right shoulder (sometimes)  . Deviated nasal septum   . Hypothyroidism   . Nondisplaced fracture of fifth left metatarsal bone   . PTSD (post-traumatic stress disorder)   . RA (rheumatoid arthritis) ([place]) 2016  . Rheumatoid arthritis ([place]) 12/09/2016  . Sinusitis     Objective:  General: Well developed, nourished, in no acute distress, alert and oriented x3   Dermatology: Skin is warm, dry and supple bilateral. Medial border of the left hallux appears to be erythematous with evidence of an ingrowing nail. Pain on palpation noted to the border of the nail fold. The remaining nails appear unremarkable at this time. There are no open sores, lesions.  Vascular: Dorsalis Pedis artery and Posterior Tibial artery pedal pulses palpable. No lower extremity edema noted.   Neruologic: Grossly intact via light touch bilateral.  Musculoskeletal: Muscular strength within normal limits in all groups bilateral. Normal range of motion noted to all pedal and ankle joints.   Assesement: #1 Paronychia with ingrowing nail medial border left hallux  #2 Pain in toe #3 Incurvated nail  Plan of Care:  1. Patient evaluated.  2. Discussed treatment alternatives and plan of care. Explained nail avulsion procedure and post procedure course to patient. 3. Patient opted for permanent partial nail avulsion of the medial border left hallux.  4. Prior to procedure, local anesthesia infiltration utilized using 3 ml of a 50:50 mixture of 2% plain lidocaine and 0.5%  plain marcaine in a normal hallux block fashion and a betadine prep performed.  5. After Lidocaine block was administered, patient decided not to have procedure done.  6. Return to clinic as needed.   Garment/textile technologist for [employer].   Edrick Kins, DPM [facility]  Dr. Edrick Kins, [place]                                        [place],  [address]                Office [phone]  Fax [phone]

## 2019-02-01 LAB — SPECIMEN STATUS REPORT

## 2019-02-11 NOTE — Progress Notes (Signed)
Bretlyn Ward CMA was contacting [facility]. No result ever received. Patient was to follow up with ear nose and throat physician.

## 2019-02-13 NOTE — Addendum Note (Signed)
Addended by: Judie Petit on: 02/13/2019 03:01 PM   Modules accepted: Orders

## 2019-02-14 ENCOUNTER — Ambulatory Visit: Payer: Managed Care, Other (non HMO) | Admitting: Family Medicine

## 2019-02-19 ENCOUNTER — Other Ambulatory Visit: Payer: Self-pay | Admitting: Family Medicine

## 2019-02-19 NOTE — Telephone Encounter (Signed)
LOV: 12/18/2018 Trazodone: Last RX 01/22/18 with 11 refills. Meloxicam: Last Rx 01/14/19 Qty:30 R: 0 Next office visit with Dr.B.02/22/19

## 2019-02-22 ENCOUNTER — Encounter: Payer: Self-pay | Admitting: Family Medicine

## 2019-02-22 ENCOUNTER — Telehealth: Payer: Self-pay

## 2019-02-22 ENCOUNTER — Ambulatory Visit (INDEPENDENT_AMBULATORY_CARE_PROVIDER_SITE_OTHER): Payer: Managed Care, Other (non HMO) | Admitting: Family Medicine

## 2019-02-22 VITALS — BP 137/86 | HR 89 | Temp 98.2°F

## 2019-02-22 DIAGNOSIS — R0602 Shortness of breath: Secondary | ICD-10-CM

## 2019-02-22 DIAGNOSIS — R0989 Other specified symptoms and signs involving the circulatory and respiratory systems: Secondary | ICD-10-CM | POA: Diagnosis not present

## 2019-02-22 DIAGNOSIS — F902 Attention-deficit hyperactivity disorder, combined type: Secondary | ICD-10-CM | POA: Diagnosis not present

## 2019-02-22 DIAGNOSIS — R05 Cough: Secondary | ICD-10-CM | POA: Diagnosis not present

## 2019-02-22 DIAGNOSIS — R059 Cough, unspecified: Secondary | ICD-10-CM

## 2019-02-22 MED ORDER — MELOXICAM 15 MG PO TABS: 15.0000 mg | ORAL_TABLET | Freq: Every day | ORAL | 5 refills | Status: DC

## 2019-02-22 MED ORDER — AMPHETAMINE-DEXTROAMPHETAMINE 15 MG PO TABS: 15.0000 mg | ORAL_TABLET | Freq: Two times a day (BID) | ORAL | 0 refills | Status: DC

## 2019-02-22 MED ORDER — TRAZODONE HCL 150 MG PO TABS: ORAL_TABLET | ORAL | 11 refills | Status: DC

## 2019-02-22 NOTE — Telephone Encounter (Signed)
Forms signed. sd

## 2019-02-22 NOTE — Progress Notes (Signed)
Patient: [name] Female    DOB: October 13, 1976   43 y.o.   MRN: [id number] Visit Date: 02/22/2019  Today's Provider: Lavon Paganini, MD   Chief Complaint  Patient presents with  . URI  . ADD Follow Up   Subjective:    I, Tiburcio Pea, CMA, am acting as a scribe for Lavon Paganini, MD.    URI   This is a new problem. The current episode started yesterday. The problem has been unchanged. There has been no fever. Associated symptoms include coughing and wheezing. Associated symptoms comments: SOB.   She has felt subjective fever, but takes daily Mobic. 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 Has used inhaler with no change in symptoms    Follow up forADD  The patient was last seen for this38monthsago. Changes made at last visit includescontinuing Adderall.  Shereports goodcompliance with treatment. Shefeels that condition is stable. Sheis nothaving side effects.   Also taking trazodone for sleep.  Helps. Needs refill.    Allergies  Allergen Reactions  . Penicillins Swelling    Pt states caused throat to swell   . Cefdinir Palpitations  . Sulfa Antibiotics Other (See Comments)    Lips tingled     Current Outpatient Medications:  .  albuterol (PROVENTIL HFA;VENTOLIN HFA) 108 (90 Base) MCG/ACT inhaler, INHALE 2 PUFFS INTO THE LUNGS EVERY 6 HOURS AS NEEDED FOR WHEEZING OR SHORTNESS OF BREATH, Disp: 8.5 g, Rfl: 2 .  amphetamine-dextroamphetamine (ADDERALL) 15 MG tablet, Take 1 tablet by mouth 2 (two) times daily., Disp: 60 tablet, Rfl: 0 .  levofloxacin (LEVAQUIN) 500 MG tablet, Take 1 tablet (500 mg total) by mouth daily., Disp: 7 tablet, Rfl: 0 .  liothyronine (CYTOMEL) 5 MCG tablet, Take 25 mcg by mouth daily. , Disp: , Rfl:  .  magnesium gluconate (MAGONATE) 500 MG tablet, Take 500 mg by mouth 2 (two) times daily., Disp: , Rfl:  .  meloxicam (MOBIC) 15 MG tablet, TAKE 1 TABLET BY  MOUTH DAILY, Disp: 30 tablet, Rfl: 0 .  MICROGESTIN FE 1/20 1-20 MG-MCG tablet, , Disp: , Rfl:  .  naratriptan (AMERGE) 2.5 MG tablet, Take 2.5 mg by mouth as needed for migraine. Take one (1) tablet at onset of headache; if returns or does not resolve, may repeat after 4 hours; do not exceed five (5) mg in 24 hours., Disp: , Rfl:  .  ondansetron (ZOFRAN) 4 MG tablet, Take 4 mg by mouth every 8 (eight) hours as needed for nausea or vomiting., Disp: , Rfl:  .  SUMAtriptan (IMITREX) 50 MG tablet, Take 1 tablet (50 mg total) every 2 (two) hours as needed by mouth for migraine. May repeat in 2 hours if headache persists or recurs. (Patient taking differently: Take 100 mg by mouth every 2 (two) hours as needed for migraine. May repeat in 2 hours if headache persists or recurs.), Disp: 10 tablet, Rfl: 0 .  traZODone (DESYREL) 150 MG tablet, TAKE 1/2 TABLET(75 MG) BY MOUTH AT BEDTIME, Disp: 45 tablet, Rfl: 11  Review of Systems  Constitutional: Positive for chills, fatigue and fever. Negative for activity change, appetite change, diaphoresis and unexpected weight change.  HENT: Negative.   Respiratory: Positive for cough, shortness of breath and wheezing.   Cardiovascular: Negative.   Gastrointestinal: Negative.   Genitourinary: Negative.   Musculoskeletal: Positive for arthralgias and myalgias.  Skin: Negative.   Neurological: Negative.  Psychiatric/Behavioral: Negative.     Social History   Tobacco Use  . Smoking status: Never Smoker  . Smokeless tobacco: Never Used  Substance Use Topics  . Alcohol use: Yes    Alcohol/week: 0.0 standard drinks    Comment: rare- wine       Objective:   BP 137/86 (BP Location: Left Arm, Patient Position: Sitting, Cuff Size: Normal)   Pulse 89   Temp 98.2 F (36.8 C) (Oral)   SpO2 97%  Vitals:   02/22/19 1040  BP: 137/86  Pulse: 89  Temp: 98.2 F (36.8 C)  TempSrc: Oral  SpO2: 97%     Physical Exam Vitals signs reviewed.  Constitutional:       General: She is not in acute distress.    Appearance: She is ill-appearing. She is not toxic-appearing or diaphoretic.  HENT:     Head: Normocephalic and atraumatic.     Right Ear: Tympanic membrane, ear canal and external ear normal.     Left Ear: Tympanic membrane, ear canal and external ear normal.     Nose: Nose normal.     Mouth/Throat:     Mouth: Mucous membranes are moist.     Pharynx: Oropharynx is clear. No oropharyngeal exudate.  Eyes:     General: No scleral icterus.    Conjunctiva/sclera: Conjunctivae normal.  Neck:     Musculoskeletal: Neck supple.  Cardiovascular:     Rate and Rhythm: Normal rate and regular rhythm.     Pulses: Normal pulses.     Heart sounds: Normal heart sounds. No murmur.  Pulmonary:     Effort: Pulmonary effort is normal. No respiratory distress.     Breath sounds: Rhonchi (in bilateral bases) present. No wheezing.  Abdominal:     General: There is no distension.     Palpations: Abdomen is soft.     Tenderness: There is no abdominal tenderness.  Musculoskeletal:     Right lower leg: No edema.     Left lower leg: No edema.  Lymphadenopathy:     Cervical: No cervical adenopathy.  Skin:    General: Skin is warm and dry.     Capillary Refill: Capillary refill takes less than 2 seconds.     Findings: No rash.  Neurological:     Mental Status: She is alert and oriented to person, place, and time. Mental status is at baseline.         Assessment & Plan   Problem List Items Addressed This Visit      Other   ADD (attention deficit disorder)    Stable and well-controlled No medication side effects Continue Adderall Follow-up in 3 months       Other Visit Diagnoses    Cough    -  Primary   Relevant Orders   Novel Coronavirus 2019   Shortness of breath       Relevant Orders   Novel Coronavirus 2019   Bibasilar crackles       Relevant Orders   Novel Coronavirus 2019    - symptoms and exam c/w lower respiratory tract infection  -Given subjective fever, shortness of breath, cough with bibasilar crackles, some concern for possible COVID19 infection - Swab collected for COVID19 - no evidence of strep pharyngitis, AOM, bacterial sinusitis -Patient to be put on home quarantine pending lab results - discussed symptomatic management, natural course, and return precautions    The entirety of the information documented in the History of Present Illness, Review of  Systems and Physical Exam were personally obtained by me. Portions of this information were initially documented by Tiburcio Pea, CMA and reviewed by me for thoroughness and accuracy.    Virginia Crews, MD, MPH [facility] 02/27/2019 10:27 AM

## 2019-02-22 NOTE — Telephone Encounter (Signed)
LMTCB, Patient needs to come back to the office and sign quarantine papers. She will need to call when she is in the parking lot and someone will have to glove up and take the forms out for her to sign. The forms are on Nikki's desk.

## 2019-02-25 ENCOUNTER — Telehealth: Payer: Self-pay | Admitting: Family Medicine

## 2019-02-25 NOTE — Telephone Encounter (Signed)
Patient advised lab results are not back at this time.

## 2019-02-25 NOTE — Telephone Encounter (Signed)
Calling back on Coronavirus results.  Please call pt back asap to discuss.  Thanks, [employer]

## 2019-02-26 ENCOUNTER — Telehealth: Payer: Self-pay

## 2019-02-26 DIAGNOSIS — R059 Cough, unspecified: Secondary | ICD-10-CM

## 2019-02-26 DIAGNOSIS — R05 Cough: Secondary | ICD-10-CM

## 2019-02-26 DIAGNOSIS — R0789 Other chest pain: Secondary | ICD-10-CM

## 2019-02-26 LAB — NOVEL CORONAVIRUS, NAA: SARS-CoV-2, NAA: NOT DETECTED

## 2019-02-26 NOTE — Telephone Encounter (Signed)
Pt returned missed call. Needing a call back today regarding forms to go back to work tomorrow.  Please advise.  Thanks, [employer]

## 2019-02-26 NOTE — Telephone Encounter (Signed)
LMTCB

## 2019-02-26 NOTE — Telephone Encounter (Signed)
Pt called back returning call states she is still sick and has questions about that.  Also states she needs the paperwork that was signed taking her out of work.  It has not uploaded to my-chart yet.    CB# [phone]

## 2019-02-26 NOTE — Telephone Encounter (Signed)
-----   Message from Virginia Crews, MD sent at 02/26/2019  8:33 AM EDT ----- Negative for COVID19.  How is she feeling?

## 2019-02-27 ENCOUNTER — Encounter: Payer: Self-pay | Admitting: Family Medicine

## 2019-02-27 NOTE — Telephone Encounter (Signed)
Patient states she is still experiencing cough and chest tightness. She went back to work today. Any further recommendations?

## 2019-02-27 NOTE — Telephone Encounter (Signed)
Lets get a chest XRay.  She had no wheezing so this is not asthma exacerbation.

## 2019-02-27 NOTE — Telephone Encounter (Signed)
LMTCB

## 2019-02-27 NOTE — Assessment & Plan Note (Signed)
Stable and well-controlled No medication side effects Continue Adderall Follow-up in 3 months

## 2019-02-27 NOTE — Telephone Encounter (Signed)
What symptoms remain?  I can see the letter in the chart for work, but we can print her a new one to go back today or tomorrow.  I was out of office yesterday, so I was unable to call back yesterday.

## 2019-02-28 NOTE — Telephone Encounter (Signed)
LMTCB

## 2019-03-14 ENCOUNTER — Ambulatory Visit (INDEPENDENT_AMBULATORY_CARE_PROVIDER_SITE_OTHER): Payer: Managed Care, Other (non HMO) | Admitting: Family Medicine

## 2019-03-14 DIAGNOSIS — F411 Generalized anxiety disorder: Secondary | ICD-10-CM | POA: Diagnosis not present

## 2019-03-14 DIAGNOSIS — F331 Major depressive disorder, recurrent, moderate: Secondary | ICD-10-CM

## 2019-03-14 MED ORDER — BUPROPION HCL 75 MG PO TABS: 75.0000 mg | ORAL_TABLET | Freq: Two times a day (BID) | ORAL | 2 refills | Status: DC

## 2019-03-14 NOTE — Progress Notes (Addendum)
Patient: [name] Female    DOB: 1976/01/21   43 y.o.   MRN: [id number] Visit Date: 03/15/2019  Today's Provider: Lavon Paganini, MD   Chief Complaint  Patient presents with  . Depression  . Anxiety   Subjective:    I, [name] CMA, am acting as a scribe for Lavon Paganini, MD.   Virtual Visit via Video Note  I connected with [employer] on 03/15/19 at  8:40 AM EDT by a video enabled telemedicine application and verified that I am speaking with the correct person using two identifiers.   I discussed the limitations of evaluation and management by telemedicine and the availability of in person appointments. The patient expressed understanding and agreed to proceed.   Patient location: home Provider location: [place] involved in the visit: patient, provider    HPI  Depression & Anxiety Patient virtual visit today is for depression and anxiety follow-up. Patient states her depression has worsen over the last few weeks. Patient states that she has be feeling little more down, having sleep disturbance and decrease in concentration.  This took a back seat due to testing for Coronavirus and illness.  Has a lot of apathy and flat mood.  Has taken lexapro in the past.  Seeing her therapist.  No pleasure from working out which she normally loves.  Feels tearful.  Lexapro helped in the past, but hair seemed to fall out more.    Read about wellbutrin and would be interested in trying it as it has less side effects.  GAD 7 : Generalized Anxiety Score 03/14/2019  Nervous, Anxious, on Edge 2  Control/stop worrying 2  Worry too much - different things 3  Trouble relaxing 3  Restless 3  Easily annoyed or irritable 3  Afraid - awful might happen 0  Total GAD 7 Score 16  Anxiety Difficulty Somewhat difficult     Depression screen [facility] 2/9 03/14/2019 12/18/2018 10/16/2017  Decreased Interest 2 0 1  Down, Depressed, Hopeless 2 0 1  PHQ  - 2 Score 4 0 2  Altered sleeping 1 - 1  Tired, decreased energy 3 - 1  Change in appetite 3 - 2  Feeling bad or failure about yourself  2 - 1  Trouble concentrating 2 - 3  Moving slowly or fidgety/restless 2 - 3  Suicidal thoughts 0 - 0  PHQ-9 Score 17 - 13  Difficult doing work/chores Somewhat difficult - Very difficult   Allergies  Allergen Reactions  . Penicillins Swelling    Pt states caused throat to swell   . Cefdinir Palpitations  . Sulfa Antibiotics Other (See Comments)    Lips tingled     Current Outpatient Medications:  .  albuterol (PROVENTIL HFA;VENTOLIN HFA) 108 (90 Base) MCG/ACT inhaler, INHALE 2 PUFFS INTO THE LUNGS EVERY 6 HOURS AS NEEDED FOR WHEEZING OR SHORTNESS OF BREATH, Disp: 8.5 g, Rfl: 2 .  amphetamine-dextroamphetamine (ADDERALL) 15 MG tablet, Take 1 tablet by mouth 2 (two) times daily., Disp: 60 tablet, Rfl: 0 .  amphetamine-dextroamphetamine (ADDERALL) 15 MG tablet, Take 1 tablet by mouth 2 (two) times daily., Disp: 60 tablet, Rfl: 0 .  amphetamine-dextroamphetamine (ADDERALL) 15 MG tablet, Take 1 tablet by mouth 2 (two) times daily., Disp: 60 tablet, Rfl: 0 .  levofloxacin (LEVAQUIN) 500 MG tablet, Take 1 tablet (500 mg total) by mouth daily., Disp: 7 tablet, Rfl: 0 .  liothyronine (CYTOMEL) 5 MCG tablet, Take 25 mcg  by mouth daily. , Disp: , Rfl:  .  magnesium gluconate (MAGONATE) 500 MG tablet, Take 500 mg by mouth 2 (two) times daily., Disp: , Rfl:  .  meloxicam (MOBIC) 15 MG tablet, Take 1 tablet (15 mg total) by mouth daily., Disp: 30 tablet, Rfl: 5 .  MICROGESTIN FE 1/20 1-20 MG-MCG tablet, , Disp: , Rfl:  .  naratriptan (AMERGE) 2.5 MG tablet, Take 2.5 mg by mouth as needed for migraine. Take one (1) tablet at onset of headache; if returns or does not resolve, may repeat after 4 hours; do not exceed five (5) mg in 24 hours., Disp: , Rfl:  .  ondansetron (ZOFRAN) 4 MG tablet, Take 4 mg by mouth every 8 (eight) hours as needed for nausea or vomiting.,  Disp: , Rfl:  .  SUMAtriptan (IMITREX) 50 MG tablet, Take 1 tablet (50 mg total) every 2 (two) hours as needed by mouth for migraine. May repeat in 2 hours if headache persists or recurs. (Patient taking differently: Take 100 mg by mouth every 2 (two) hours as needed for migraine. May repeat in 2 hours if headache persists or recurs.), Disp: 10 tablet, Rfl: 0 .  traZODone (DESYREL) 150 MG tablet, TAKE 1/2 TABLET(75 MG) BY MOUTH AT BEDTIME, Disp: 45 tablet, Rfl: 11 .  buPROPion (WELLBUTRIN) 75 MG tablet, Take 1 tablet (75 mg total) by mouth 2 (two) times daily., Disp: 60 tablet, Rfl: 2  Review of Systems  HENT: Negative.   Respiratory: Negative.   Genitourinary: Negative.   Neurological: Negative.   Psychiatric/Behavioral: Positive for agitation, decreased concentration and sleep disturbance.    Social History   Tobacco Use  . Smoking status: Never Smoker  . Smokeless tobacco: Never Used  Substance Use Topics  . Alcohol use: Yes    Alcohol/week: 0.0 standard drinks    Comment: rare- wine       Objective:   There were no vitals taken for this visit. There were no vitals filed for this visit.   Physical Exam Vitals signs reviewed.  Constitutional:      General: She is not in acute distress.    Appearance: Normal appearance. She is not diaphoretic.  HENT:     Head: Normocephalic and atraumatic.  Eyes:     General: No scleral icterus.    Conjunctiva/sclera: Conjunctivae normal.  Pulmonary:     Effort: Pulmonary effort is normal. No respiratory distress.  Neurological:     Mental Status: She is alert and oriented to person, place, and time. Mental status is at baseline.  Psychiatric:        Mood and Affect: Mood is anxious and depressed. Affect is flat and tearful.        Speech: Speech normal.        Behavior: Behavior normal.        Thought Content: Thought content does not include homicidal or suicidal ideation. Thought content does not include homicidal or suicidal plan.          Assessment & Plan   Problem List Items Addressed This Visit      Other   GAD (generalized anxiety disorder)    New diagnosis Has struggled in the past, but never been diagnosed She does not seem to have any worsening with her Adderall Discussed SSRI therapy, but she is hesitant due to possible side effects She prefers to try Wellbutrin Discussed that this can be activating and may worsen her anxiety, but we will try it She should continue  therapy as she is doing Contracted for safety Discussed return precautions Follow-up in 1 month and repeat GAD-7 and PHQ-9      Relevant Medications   buPROPion (WELLBUTRIN) 75 MG tablet   Moderate episode of recurrent major depressive disorder ([place]) - Primary    New diagnosis Has struggled with some depressed mood in the past, but never been diagnosed with MDD Discussed SSRI therapy, but patient is hesitant due to possible side effects We will try Wellbutrin instead Discussed proper way of dosing Discussed possible side effects Contracted for safety Continue therapy Follow-up in 1 month and repeat PHQ 9 and GAD 7      Relevant Medications   buPROPion (WELLBUTRIN) 75 MG tablet      Return in about 4 weeks (around 04/11/2019) for depression f/u.   The entirety of the information documented in the History of Present Illness, Review of Systems and Physical Exam were personally obtained by me. Portions of this information were initially documented by [facility], CMA and reviewed by me for thoroughness and accuracy.   Follow Up Instructions:   I discussed the assessment and treatment plan with the patient. The patient was provided an opportunity to ask questions and all were answered. The patient agreed with the plan and demonstrated an understanding of the instructions.   The patient was advised to call back or seek an in-person evaluation if the symptoms worsen or if the condition fails to improve as anticipated.     Virginia Crews, MD, MPH [facility] 03/15/2019 10:36 AM

## 2019-03-14 NOTE — Patient Instructions (Signed)

## 2019-03-15 ENCOUNTER — Encounter: Payer: Self-pay | Admitting: Family Medicine

## 2019-03-15 DIAGNOSIS — F331 Major depressive disorder, recurrent, moderate: Secondary | ICD-10-CM | POA: Insufficient documentation

## 2019-03-15 DIAGNOSIS — F411 Generalized anxiety disorder: Secondary | ICD-10-CM | POA: Insufficient documentation

## 2019-03-15 NOTE — Assessment & Plan Note (Signed)
New diagnosis Has struggled with some depressed mood in the past, but never been diagnosed with MDD Discussed SSRI therapy, but patient is hesitant due to possible side effects We will try Wellbutrin instead Discussed proper way of dosing Discussed possible side effects Contracted for safety Continue therapy Follow-up in 1 month and repeat PHQ 9 and GAD 7

## 2019-03-15 NOTE — Assessment & Plan Note (Signed)
New diagnosis Has struggled in the past, but never been diagnosed She does not seem to have any worsening with her Adderall Discussed SSRI therapy, but she is hesitant due to possible side effects She prefers to try Wellbutrin Discussed that this can be activating and may worsen her anxiety, but we will try it She should continue therapy as she is doing Contracted for safety Discussed return precautions Follow-up in 1 month and repeat GAD-7 and PHQ-9

## 2019-03-26 ENCOUNTER — Other Ambulatory Visit: Payer: Self-pay | Admitting: Family Medicine

## 2019-04-01 ENCOUNTER — Encounter: Payer: Self-pay | Admitting: Family Medicine

## 2019-04-05 ENCOUNTER — Encounter: Payer: Self-pay | Admitting: Physician Assistant

## 2019-04-05 ENCOUNTER — Other Ambulatory Visit: Payer: Self-pay

## 2019-04-05 ENCOUNTER — Ambulatory Visit (INDEPENDENT_AMBULATORY_CARE_PROVIDER_SITE_OTHER): Payer: Managed Care, Other (non HMO) | Admitting: Physician Assistant

## 2019-04-05 VITALS — BP 133/80 | HR 95 | Temp 98.4°F

## 2019-04-05 DIAGNOSIS — J4541 Moderate persistent asthma with (acute) exacerbation: Secondary | ICD-10-CM

## 2019-04-05 MED ORDER — AZITHROMYCIN 250 MG PO TABS: ORAL_TABLET | ORAL | 0 refills | Status: DC

## 2019-04-05 MED ORDER — BENZONATATE 200 MG PO CAPS: 200.0000 mg | ORAL_CAPSULE | Freq: Three times a day (TID) | ORAL | 0 refills | Status: DC | PRN

## 2019-04-05 MED ORDER — ALBUTEROL SULFATE (2.5 MG/3ML) 0.083% IN NEBU: 2.5000 mg | INHALATION_SOLUTION | Freq: Four times a day (QID) | RESPIRATORY_TRACT | 1 refills | Status: DC | PRN

## 2019-04-05 MED ORDER — FLUTICASONE FUROATE-VILANTEROL 100-25 MCG/INH IN AEPB: 1.0000 | INHALATION_SPRAY | Freq: Every day | RESPIRATORY_TRACT | 0 refills | Status: DC

## 2019-04-05 MED ORDER — ALBUTEROL SULFATE HFA 108 (90 BASE) MCG/ACT IN AERS: INHALATION_SPRAY | RESPIRATORY_TRACT | 2 refills | Status: DC

## 2019-04-05 NOTE — Progress Notes (Signed)
Patient: [name] Female    DOB: Aug 28, 1976   43 y.o.   MRN: [id number] Visit Date: 04/05/2019  Today's Provider: Mar Daring, PA-C   Chief Complaint  Patient presents with  . URI   Subjective:    I, [name] CMA, am acting as a Education administrator for [employer], PA-C.   HPI  URI Patient presents today for cough, chest tightness and tickle of lower throat area to her chest since 03/19/2019. Patient states she have not had any fevers or other symptoms at the moment. Patient has a history of asthma and has a rescue inhaler (albuterol). She has had to increase her rescue inhaler use over the last month. She is still having episodes of coughing spells randomly. Feels as if she cannot take a deep breath.    Allergies  Allergen Reactions  . Penicillins Swelling    Pt states caused throat to swell   . Cefdinir Palpitations  . Sulfa Antibiotics Other (See Comments)    Lips tingled     Current Outpatient Medications:  .  albuterol (VENTOLIN HFA) 108 (90 Base) MCG/ACT inhaler, INHALE 2 PUFFS INTO THE LUNGS EVERY 6 HOURS AS NEEDED FOR WHEEZING OR SHORTNESS OF BREATH, Disp: 8.5 g, Rfl: 2 .  amphetamine-dextroamphetamine (ADDERALL) 15 MG tablet, Take 1 tablet by mouth 2 (two) times daily., Disp: 60 tablet, Rfl: 0 .  amphetamine-dextroamphetamine (ADDERALL) 15 MG tablet, Take 1 tablet by mouth 2 (two) times daily., Disp: 60 tablet, Rfl: 0 .  amphetamine-dextroamphetamine (ADDERALL) 15 MG tablet, Take 1 tablet by mouth 2 (two) times daily., Disp: 60 tablet, Rfl: 0 .  buPROPion (WELLBUTRIN) 75 MG tablet, Take 1 tablet (75 mg total) by mouth 2 (two) times daily., Disp: 60 tablet, Rfl: 2 .  levofloxacin (LEVAQUIN) 500 MG tablet, Take 1 tablet (500 mg total) by mouth daily., Disp: 7 tablet, Rfl: 0 .  liothyronine (CYTOMEL) 5 MCG tablet, Take 25 mcg by mouth daily. , Disp: , Rfl:  .  magnesium gluconate (MAGONATE) 500 MG tablet, Take 500 mg by mouth 2 (two) times daily., Disp:  , Rfl:  .  meloxicam (MOBIC) 15 MG tablet, TAKE 1 TABLET BY MOUTH EVERY DAY, Disp: 90 tablet, Rfl: 3 .  MICROGESTIN FE 1/20 1-20 MG-MCG tablet, , Disp: , Rfl:  .  naratriptan (AMERGE) 2.5 MG tablet, Take 2.5 mg by mouth as needed for migraine. Take one (1) tablet at onset of headache; if returns or does not resolve, may repeat after 4 hours; do not exceed five (5) mg in 24 hours., Disp: , Rfl:  .  ondansetron (ZOFRAN) 4 MG tablet, Take 4 mg by mouth every 8 (eight) hours as needed for nausea or vomiting., Disp: , Rfl:  .  SUMAtriptan (IMITREX) 50 MG tablet, Take 1 tablet (50 mg total) every 2 (two) hours as needed by mouth for migraine. May repeat in 2 hours if headache persists or recurs. (Patient taking differently: Take 100 mg by mouth every 2 (two) hours as needed for migraine. May repeat in 2 hours if headache persists or recurs.), Disp: 10 tablet, Rfl: 0 .  traZODone (DESYREL) 150 MG tablet, TAKE 1/2 TABLET(75 MG) BY MOUTH AT BEDTIME, Disp: 45 tablet, Rfl: 11 .  albuterol (PROVENTIL) (2.5 MG/3ML) 0.083% nebulizer solution, Take 3 mLs (2.5 mg total) by nebulization every 6 (six) hours as needed for wheezing or shortness of breath., Disp: 150 mL, Rfl: 1 .  azithromycin (ZITHROMAX) 250 MG tablet, Take  2 tablets PO on day one, and one tablet PO daily thereafter until completed., Disp: 6 tablet, Rfl: 0 .  benzonatate (TESSALON) 200 MG capsule, Take 1 capsule (200 mg total) by mouth 3 (three) times daily as needed for cough., Disp: 30 capsule, Rfl: 0 .  fluticasone furoate-vilanterol (BREO ELLIPTA) 100-25 MCG/INH AEPB, Inhale 1 puff into the lungs daily., Disp: 60 each, Rfl: 0  Review of Systems  Constitutional: Negative.  Negative for fatigue and fever.  HENT: Negative.   Respiratory: Positive for cough and chest tightness.   Cardiovascular: Negative.   Genitourinary: Negative.   Neurological: Negative.     Social History   Tobacco Use  . Smoking status: Never Smoker  . Smokeless tobacco:  Never Used  Substance Use Topics  . Alcohol use: Yes    Alcohol/week: 0.0 standard drinks    Comment: rare- wine       Objective:   BP 133/80 (BP Location: Left Arm, Patient Position: Sitting, Cuff Size: Normal)   Pulse 95   Temp 98.4 F (36.9 C) (Oral)   SpO2 98%  Vitals:   04/05/19 1445  BP: 133/80  Pulse: 95  Temp: 98.4 F (36.9 C)  TempSrc: Oral  SpO2: 98%     Physical Exam Vitals signs reviewed.  Constitutional:      General: She is not in acute distress.    Appearance: Normal appearance. She is well-developed and normal weight. She is not ill-appearing or diaphoretic.  HENT:     Head: Normocephalic and atraumatic.     Right Ear: Hearing, tympanic membrane, ear canal and external ear normal.     Left Ear: Hearing, tympanic membrane, ear canal and external ear normal.     Nose: Nose normal.     Mouth/Throat:     Pharynx: Uvula midline. No oropharyngeal exudate.  Eyes:     General: No scleral icterus.       Right eye: No discharge.        Left eye: No discharge.     Conjunctiva/sclera: Conjunctivae normal.     Pupils: Pupils are equal, round, and reactive to light.  Neck:     Musculoskeletal: Normal range of motion and neck supple.     Thyroid: No thyromegaly.     Trachea: No tracheal deviation.  Cardiovascular:     Rate and Rhythm: Normal rate and regular rhythm.     Heart sounds: Normal heart sounds. No murmur. No friction rub. No gallop.   Pulmonary:     Effort: Pulmonary effort is normal. No respiratory distress.     Breath sounds: Decreased air movement present. No stridor. Examination of the right-upper field reveals rales. Examination of the right-middle field reveals rales. Decreased breath sounds (throuthout), wheezing (throughout) and rales present. No rhonchi.  Lymphadenopathy:     Cervical: No cervical adenopathy.  Skin:    General: Skin is warm and dry.  Neurological:     Mental Status: She is alert.         Assessment & Plan    1.  Moderate persistent asthmatic bronchitis with acute exacerbation Suspect asthmatic bronchitis. Diffuse abnormal breath sounds. No focal consolidation. Will treat with zpak as below. Albuterol inhaler and nebulizer medication refilled. Tessalon perles for cough prn. Breo inhaler prescribed for once daily use during acute exacerbation. Call if worsening and may require oral steroid treatment. She is in agreement.  - albuterol (PROVENTIL) (2.5 MG/3ML) 0.083% nebulizer solution; Take 3 mLs (2.5 mg total) by nebulization every 6 (  six) hours as needed for wheezing or shortness of breath.  Dispense: 150 mL; Refill: 1 - azithromycin (ZITHROMAX) 250 MG tablet; Take 2 tablets PO on day one, and one tablet PO daily thereafter until completed.  Dispense: 6 tablet; Refill: 0 - fluticasone furoate-vilanterol (BREO ELLIPTA) 100-25 MCG/INH AEPB; Inhale 1 puff into the lungs daily.  Dispense: 60 each; Refill: 0 - benzonatate (TESSALON) 200 MG capsule; Take 1 capsule (200 mg total) by mouth 3 (three) times daily as needed for cough.  Dispense: 30 capsule; Refill: 0 - albuterol (VENTOLIN HFA) 108 (90 Base) MCG/ACT inhaler; INHALE 2 PUFFS INTO THE LUNGS EVERY 6 HOURS AS NEEDED FOR WHEEZING OR SHORTNESS OF BREATH  Dispense: 8.5 g; Refill: 2     Mar Daring, PA-C  [facility]

## 2019-04-05 NOTE — Patient Instructions (Signed)
Bronchospasm, Adult  Bronchospasm is a tightening of the airways going into the lungs. During an episode, it may be harder to breathe. You may cough, and you may make a whistling sound when you breathe (wheeze). This condition often affects people with asthma. What are the causes? This condition is caused by swelling and irritation in the airways. It can be triggered by:  An infection (common).  Seasonal allergies.  An allergic reaction.  Exercise.  Irritants. These include pollution, cigarette smoke, strong odors, aerosol sprays, and paint fumes.  Weather changes. Winds increase molds and pollens in the air. Cold air may cause swelling.  Stress and emotional upset. What are the signs or symptoms? Symptoms of this condition include:  Wheezing. If the episode was triggered by an allergy, wheezing may start right away or hours later.  Nighttime coughing.  Frequent or severe coughing with a simple cold.  Chest tightness.  Shortness of breath.  Decreased ability to exercise. How is this diagnosed? This condition is usually diagnosed with a review of your medical history and a physical exam. Tests, such as lung function tests, are sometimes done to look for other conditions. The need for a chest X-ray depends on where the wheezing occurs and whether it is the first time you have wheezed. How is this treated? This condition may be treated with:  Inhaled medicines. These open up the airways and help you breathe. They can be taken with an inhaler or a nebulizer device.  Corticosteroid medicines. These may be given for severe bronchospasm, usually when it is associated with asthma.  Avoiding triggers, such as irritants, infection, or allergies. Follow these instructions at home: Medicines  Take over-the-counter and prescription medicines only as told by your health care provider.  If you need to use an inhaler or nebulizer to take your medicine, ask your health care provider  to explain how to use it correctly. If you were given a spacer, always use it with your inhaler. Lifestyle  Reduce the number of triggers in your home. To do this: ? Change your heating and air conditioning filter at least once a month. ? Limit your use of fireplaces and wood stoves. ? Do not smoke. Do not allow smoking in your home. ? Avoid using perfumes and fragrances. ? Get rid of pests, such as roaches and mice, and their droppings. ? Remove any mold from your home. ? Keep your house clean and dust free. Use unscented cleaning products. ? Replace carpet with wood, tile, or vinyl flooring. Carpet can trap dander and dust. ? Use allergy-proof pillows, mattress covers, and box spring covers. ? Wash bed sheets and blankets every week in hot water. Dry them in a dryer. ? Use blankets that are made of polyester or cotton. ? Wash your hands often. ? Do not allow pets in your bedroom.  Avoid breathing in cold air when you exercise. General instructions  Have a plan for seeking medical care. Know when to call your health care provider and local emergency services, and where to get emergency care.  Stay up to date on your immunizations.  When you have an episode of bronchospasm, stay calm. Try to relax and breathe more slowly.  If you have asthma, make sure you have an asthma action plan.  Keep all follow-up visits as told by your health care provider. This is important. Contact a health care provider if:  You have muscle aches.  You have chest pain.  The mucus that you cough up (  If you have asthma, make sure you have an asthma action plan.  · Keep all follow-up visits as told by your health care provider. This is important.  Contact a health care provider if:  · You have muscle aches.  · You have chest pain.  · The mucus that you cough up (sputum) changes from clear or white to yellow, green, gray, or bloody.  · You have a fever.  · Your sputum gets thicker.  Get help right away if:  · Your wheezing and coughing get worse, even after you take your prescribed medicines.  · It gets even harder to breathe.  · You develop severe chest pain.  Summary  · Bronchospasm is a tightening of the airways going into the lungs.  · During an episode of  bronchospasm, you may have a harder time breathing. You may cough and make a whistling sound when you breathe (wheeze).  · Avoid exposure to triggers such as smoke, dust, mold, animal dander, and fragrances.  · When you have an episode of bronchospasm, stay calm. Try to relax and breathe more slowly.  This information is not intended to replace advice given to you by your health care provider. Make sure you discuss any questions you have with your health care provider.  Document Released: 12/01/2003 Document Revised: 11/24/2016 Document Reviewed: 11/24/2016  Elsevier Interactive Patient Education © 2019 [employer]

## 2019-04-12 ENCOUNTER — Encounter: Payer: Self-pay | Admitting: Family Medicine

## 2019-04-12 ENCOUNTER — Ambulatory Visit (INDEPENDENT_AMBULATORY_CARE_PROVIDER_SITE_OTHER): Payer: Managed Care, Other (non HMO) | Admitting: Family Medicine

## 2019-04-12 DIAGNOSIS — J4530 Mild persistent asthma, uncomplicated: Secondary | ICD-10-CM

## 2019-04-12 DIAGNOSIS — F331 Major depressive disorder, recurrent, moderate: Secondary | ICD-10-CM | POA: Diagnosis not present

## 2019-04-12 DIAGNOSIS — F411 Generalized anxiety disorder: Secondary | ICD-10-CM

## 2019-04-12 NOTE — Assessment & Plan Note (Signed)
Recently diagnosed with asthmatic bronchitis Cough, SOB slowly improving Continue Breo

## 2019-04-12 NOTE — Progress Notes (Signed)
Patient: [name] Female    DOB: 05-25-1976   43 y.o.   MRN: [id number] Visit Date: 04/12/2019  Today's Provider: Lavon Paganini, MD   Chief Complaint  Patient presents with  . Anxiety  . Depression   Subjective:    Virtual Visit via Video Note  I connected with [name] on 04/12/19 at  1:40 PM EDT by a video enabled telemedicine application and verified that I am speaking with the correct person using two identifiers.  Patient location: home Provider location: home office Persons involved in the visit: patient, provider    I discussed the limitations of evaluation and management by telemedicine and the availability of in person appointments. The patient expressed understanding and agreed to proceed.  HPI Anxiety & Depression:  Patient presents for a 1 month follow up. Last OV was on 03/14/2019. Patient started Wellbutrin 75 mg. She reports good compliance with treatment plan. She states symptoms are gradually improving.   She started noticing a difference in symptoms last week. Denies nay side effects.  Still having trouble turning her mind off at bedtime  Recently saw [place] in our office and diagnosed with asthmatic bronchitis.  Completed Zpack.  On Breo and Tessalon.  Cough is improved.  SOB still present, but improving.  GAD 7 : Generalized Anxiety Score 04/12/2019 03/14/2019  Nervous, Anxious, on Edge 1 2  Control/stop worrying 0 2  Worry too much - different things 0 3  Trouble relaxing 2 3  Restless 2 3  Easily annoyed or irritable 3 3  Afraid - awful might happen 0 0  Total GAD 7 Score 8 16  Anxiety Difficulty Somewhat difficult Somewhat difficult    Depression screen [facility] 2/9 04/12/2019 03/14/2019 12/18/2018  Decreased Interest 2 2 0  Down, Depressed, Hopeless 2 2 0  PHQ - 2 Score 4 4 0  Altered sleeping 2 1 -  Tired, decreased energy 2 3 -  Change in appetite 2 3 -  Feeling bad or failure about yourself  2 2 -  Trouble concentrating 1 2 -   Moving slowly or fidgety/restless 0 2 -  Suicidal thoughts 0 0 -  PHQ-9 Score 13 17 -  Difficult doing work/chores Somewhat difficult Somewhat difficult -    Allergies  Allergen Reactions  . Penicillins Swelling    Pt states caused throat to swell   . Cefdinir Palpitations  . Sulfa Antibiotics Other (See Comments)    Lips tingled     Current Outpatient Medications:  .  albuterol (PROVENTIL) (2.5 MG/3ML) 0.083% nebulizer solution, Take 3 mLs (2.5 mg total) by nebulization every 6 (six) hours as needed for wheezing or shortness of breath., Disp: 150 mL, Rfl: 1 .  albuterol (VENTOLIN HFA) 108 (90 Base) MCG/ACT inhaler, INHALE 2 PUFFS INTO THE LUNGS EVERY 6 HOURS AS NEEDED FOR WHEEZING OR SHORTNESS OF BREATH, Disp: 8.5 g, Rfl: 2 .  amphetamine-dextroamphetamine (ADDERALL) 15 MG tablet, Take 1 tablet by mouth 2 (two) times daily., Disp: 60 tablet, Rfl: 0 .  amphetamine-dextroamphetamine (ADDERALL) 15 MG tablet, Take 1 tablet by mouth 2 (two) times daily., Disp: 60 tablet, Rfl: 0 .  amphetamine-dextroamphetamine (ADDERALL) 15 MG tablet, Take 1 tablet by mouth 2 (two) times daily., Disp: 60 tablet, Rfl: 0 .  azithromycin (ZITHROMAX) 250 MG tablet, Take 2 tablets PO on day one, and one tablet PO daily thereafter until completed., Disp: 6 tablet, Rfl: 0 .  benzonatate (TESSALON) 200 MG capsule, Take  1 capsule (200 mg total) by mouth 3 (three) times daily as needed for cough., Disp: 30 capsule, Rfl: 0 .  buPROPion (WELLBUTRIN) 75 MG tablet, Take 1 tablet (75 mg total) by mouth 2 (two) times daily., Disp: 60 tablet, Rfl: 2 .  fluticasone furoate-vilanterol (BREO ELLIPTA) 100-25 MCG/INH AEPB, Inhale 1 puff into the lungs daily., Disp: 60 each, Rfl: 0 .  liothyronine (CYTOMEL) 5 MCG tablet, Take 25 mcg by mouth daily. , Disp: , Rfl:  .  magnesium gluconate (MAGONATE) 500 MG tablet, Take 500 mg by mouth 2 (two) times daily., Disp: , Rfl:  .  meloxicam (MOBIC) 15 MG tablet, TAKE 1 TABLET BY MOUTH  EVERY DAY, Disp: 90 tablet, Rfl: 3 .  MICROGESTIN FE 1/20 1-20 MG-MCG tablet, , Disp: , Rfl:  .  naratriptan (AMERGE) 2.5 MG tablet, Take 2.5 mg by mouth as needed for migraine. Take one (1) tablet at onset of headache; if returns or does not resolve, may repeat after 4 hours; do not exceed five (5) mg in 24 hours., Disp: , Rfl:  .  ondansetron (ZOFRAN) 4 MG tablet, Take 4 mg by mouth every 8 (eight) hours as needed for nausea or vomiting., Disp: , Rfl:  .  SUMAtriptan (IMITREX) 50 MG tablet, Take 1 tablet (50 mg total) every 2 (two) hours as needed by mouth for migraine. May repeat in 2 hours if headache persists or recurs. (Patient taking differently: Take 100 mg by mouth every 2 (two) hours as needed for migraine. May repeat in 2 hours if headache persists or recurs.), Disp: 10 tablet, Rfl: 0 .  traZODone (DESYREL) 150 MG tablet, TAKE 1/2 TABLET(75 MG) BY MOUTH AT BEDTIME, Disp: 45 tablet, Rfl: 11  Review of Systems  Constitutional: Negative.   Respiratory: Negative.   Cardiovascular: Negative.   Psychiatric/Behavioral: The patient is nervous/anxious.        Depression     Social History   Tobacco Use  . Smoking status: Never Smoker  . Smokeless tobacco: Never Used  Substance Use Topics  . Alcohol use: Yes    Alcohol/week: 0.0 standard drinks    Comment: rare- wine       Objective:   There were no vitals taken for this visit. There were no vitals filed for this visit.   Physical Exam Constitutional:      Appearance: Normal appearance.  HENT:     Head: Normocephalic and atraumatic.  Pulmonary:     Effort: Pulmonary effort is normal. No respiratory distress.  Neurological:     Mental Status: She is alert and oriented to person, place, and time. Mental status is at baseline.  Psychiatric:        Mood and Affect: Mood normal.        Behavior: Behavior normal.         Assessment & Plan      I discussed the assessment and treatment plan with the patient. The patient  was provided an opportunity to ask questions and all were answered. The patient agreed with the plan and demonstrated an understanding of the instructions.   The patient was advised to call back or seek an in-person evaluation if the symptoms worsen or if the condition fails to improve as anticipated.  Problem List Items Addressed This Visit      Respiratory   Asthma    Recently diagnosed with asthmatic bronchitis Cough, SOB slowly improving Continue Breo        Other   GAD (generalized  anxiety disorder) - Primary    Recent diagnosis Improving, but not to goal Continue Wellbutrin at current dose and reassess in 1 month and consider furhter dose titration Continue therapy Discussed that SSRIs may be better for anxiety than Wellbutrin, but patient is wary of potential side effects      Moderate episode of recurrent major depressive disorder ([place])    Recent diagnosis Still active Improving since starting wellbutrin Will give one more month of the medication at current dose to see if efficacy continues to improve Reassess at one month and consider dose increase Repeat PHQ9 and GAD7 in 1 month Contracted for safety - no SI/HI          Return in about 4 weeks (around 05/10/2019) for anxiet/depression f/u.   The entirety of the information documented in the History of Present Illness, Review of Systems and Physical Exam were personally obtained by me. Portions of this information were initially documented by Tiburcio Pea, CMA and reviewed by me for thoroughness and accuracy.    Virginia Crews, MD, MPH [facility] 04/12/2019 2:59 PM

## 2019-04-12 NOTE — Assessment & Plan Note (Signed)
Recent diagnosis Still active Improving since starting wellbutrin Will give one more month of the medication at current dose to see if efficacy continues to improve Reassess at one month and consider dose increase Repeat PHQ9 and GAD7 in 1 month Contracted for safety - no SI/HI

## 2019-04-12 NOTE — Assessment & Plan Note (Signed)
Recent diagnosis Improving, but not to goal Continue Wellbutrin at current dose and reassess in 1 month and consider furhter dose titration Continue therapy Discussed that SSRIs may be better for anxiety than Wellbutrin, but patient is wary of potential side effects

## 2019-05-02 ENCOUNTER — Other Ambulatory Visit: Payer: Self-pay | Admitting: Family Medicine

## 2019-05-02 ENCOUNTER — Other Ambulatory Visit: Payer: Self-pay | Admitting: Physician Assistant

## 2019-05-02 DIAGNOSIS — J4541 Moderate persistent asthma with (acute) exacerbation: Secondary | ICD-10-CM

## 2019-05-09 ENCOUNTER — Telehealth: Payer: Self-pay | Admitting: Family Medicine

## 2019-05-09 ENCOUNTER — Ambulatory Visit: Payer: Managed Care, Other (non HMO)

## 2019-05-09 ENCOUNTER — Encounter: Payer: Self-pay | Admitting: Adult Health

## 2019-05-09 ENCOUNTER — Ambulatory Visit: Payer: Managed Care, Other (non HMO) | Admitting: Adult Health

## 2019-05-09 ENCOUNTER — Other Ambulatory Visit: Payer: Self-pay

## 2019-05-09 VITALS — BP 108/80 | HR 101 | Temp 98.8°F | Resp 18

## 2019-05-09 DIAGNOSIS — R319 Hematuria, unspecified: Secondary | ICD-10-CM

## 2019-05-09 DIAGNOSIS — R102 Pelvic and perineal pain unspecified side: Secondary | ICD-10-CM

## 2019-05-09 DIAGNOSIS — R300 Dysuria: Secondary | ICD-10-CM

## 2019-05-09 DIAGNOSIS — R1032 Left lower quadrant pain: Secondary | ICD-10-CM | POA: Diagnosis not present

## 2019-05-09 LAB — URINALYSIS, ROUTINE W REFLEX MICROSCOPIC
Bilirubin, UA: NEGATIVE
Glucose, UA: NEGATIVE
Ketones, UA: NEGATIVE
Leukocytes,UA: NEGATIVE
Nitrite, UA: NEGATIVE
Protein,UA: NEGATIVE
RBC, UA: NEGATIVE
Specific Gravity, UA: 1.023 (ref 1.005–1.030)
Urobilinogen, Ur: 0.2 mg/dL (ref 0.2–1.0)
pH, UA: 5 (ref 5.0–7.5)

## 2019-05-09 LAB — CBC WITH DIFFERENTIAL/PLATELET
Basophils Absolute: 0 10*3/uL (ref 0.0–0.2)
Basos: 1 %
EOS (ABSOLUTE): 0 10*3/uL (ref 0.0–0.4)
Eos: 0 %
Hematocrit: 37.9 % (ref 34.0–46.6)
Hemoglobin: 12.8 g/dL (ref 11.1–15.9)
Immature Grans (Abs): 0 10*3/uL (ref 0.0–0.1)
Immature Granulocytes: 0 %
Lymphocytes Absolute: 1.5 10*3/uL (ref 0.7–3.1)
Lymphs: 35 %
MCH: 33.5 pg — ABNORMAL HIGH (ref 26.6–33.0)
MCHC: 33.8 g/dL (ref 31.5–35.7)
MCV: 99 fL — ABNORMAL HIGH (ref 79–97)
Monocytes Absolute: 0.3 10*3/uL (ref 0.1–0.9)
Monocytes: 7 %
Neutrophils Absolute: 2.5 10*3/uL (ref 1.4–7.0)
Neutrophils: 57 %
Platelets: 231 10*3/uL (ref 150–450)
RBC: 3.82 x10E6/uL (ref 3.77–5.28)
RDW: 11.5 % — ABNORMAL LOW (ref 11.7–15.4)
WBC: 4.4 10*3/uL (ref 3.4–10.8)

## 2019-05-09 LAB — POCT URINALYSIS DIPSTICK
Glucose, UA: NEGATIVE
Ketones, UA: NEGATIVE
Leukocytes, UA: NEGATIVE
Nitrite, UA: NEGATIVE
Protein, UA: NEGATIVE
Spec Grav, UA: >=1.03 — AB (ref 1.010–1.025)
Urobilinogen, UA: 0.2 E.U./dL
pH, UA: 5.5 (ref 5.0–8.0)

## 2019-05-09 LAB — POCT URINE PREGNANCY: Preg Test, Ur: NEGATIVE

## 2019-05-09 MED ORDER — CIPROFLOXACIN HCL 500 MG PO TABS: 500.0000 mg | ORAL_TABLET | Freq: Two times a day (BID) | ORAL | 0 refills | Status: DC

## 2019-05-09 NOTE — Addendum Note (Signed)
Addended by: Judie Petit on: 05/09/2019 03:56 PM   Modules accepted: Orders

## 2019-05-09 NOTE — Addendum Note (Signed)
Addended by: Judie Petit on: 05/09/2019 03:18 PM   Modules accepted: Orders

## 2019-05-09 NOTE — Telephone Encounter (Signed)
Patient called saying she is having pain in her left side groin area.  Tender to the touch.  She is having the feeling of frequent urination.  She is having just a little urine when she goes.  There is no appts in the office left.  This started Sunday with a darker urine.  CB#  [phone]  Thanks Con Memos

## 2019-05-09 NOTE — Telephone Encounter (Signed)
LMTCB to schedule a OV with [place] or [place].

## 2019-05-09 NOTE — Telephone Encounter (Signed)
Denies fever. She has not taken anything OTC for this. She has had symptoms for about 4 days. Please advise. [employer]. Thanks!

## 2019-05-09 NOTE — Telephone Encounter (Signed)
She is describing some symptoms of a UTI, but that would not be tender to the touch.  She needs to be seen in person to have an exam of that area and ensure no abscess or enlarged lymph node.  Maybe we can get her something tomorrow morning instead if nothing is available today.  Warm compress, tylenol, push fluids in the meantime

## 2019-05-09 NOTE — Progress Notes (Addendum)
[facility]  Subjective:     Patient ID: [name], female   DOB: [birth year], 43 y.o.   MRN: [id number]    Patient is a 43 year old female who comes to the clinic with complaint of pelvic pain that radiates to her left lower quadrant. Onset 4 days ago.  She reports the pain at the worst 7/10. She has not taken anything for pain.  She denies fever.   Patient's last menstrual period was 04/30/2019.  She denies any vaginal bleeding or discharge.  Denies blood in stools, diarrhea, or pain with defecation. No mucus or pus in stools.  No back pain.   She does have mild dysuria..  She reports she has been working without any difficulty today.   Allergies  Allergen Reactions  . Penicillins Swelling    Pt states caused throat to swell   . Cefdinir Palpitations  . Sulfa Antibiotics Other (See Comments)    Lips tingled   She reports she is always constipated. She uses laxatives at times to help her go " this is not new" no blood in stool. Last BM was Monday  And she usually has a bowel.  She drinks water all day. Does not drink sodas.  She did have a small amount of wine over the weekend.   She states " I had an overly sexually active weekend with my boyfriend this weekend"  Sex multiple times and this is not her normal she reports.  was overly extra sexually active over the weekend.  She denies any STD concerns reports she is with one partner and he is a marine and is tested all the time.  She denies any STD's in past.   Abdominal Pain  This is a new problem. The current episode started in the past 7 days (4 days ago). The onset quality is gradual. The problem occurs constantly. The problem has been gradually worsening. The pain is located in the LLQ and suprapubic region. The pain is at a severity of 7/10. The pain is moderate. The quality of the pain is aching and sharp. The abdominal pain radiates to the LLQ and suprapubic region.  Associated symptoms include anorexia (decreased appetitie ), dysuria and nausea (mild " queasy feeling" nop vomitted - she ate an advocodo.). Pertinent negatives include no arthralgias, belching, constipation, diarrhea, fever, flatus, frequency, headaches, hematochezia, hematuria, melena, myalgias, vomiting or weight loss. The pain is aggravated by certain positions (sitting worsens ). The pain is relieved by certain positions. She has tried nothing for the symptoms. There is no history of abdominal surgery, colon cancer, Crohn's disease, gallstones, GERD, irritable bowel syndrome, pancreatitis, PUD or ulcerative colitis.  Urinary Tract Infection   Associated symptoms include nausea (mild " queasy feeling" nop vomitted - she ate an advocodo.). Pertinent negatives include no chills, flank pain, frequency, hematuria, urgency or vomiting.   No abnormal vaginal discharge.   Review of Systems  Constitutional: Negative for activity change, appetite change, chills, diaphoresis, fatigue, fever, unexpected weight change and weight loss.  HENT: Negative.   Eyes: Negative.   Respiratory: Negative.  Negative for apnea, cough, choking, chest tightness, shortness of breath, wheezing and stridor.   Cardiovascular: Negative for chest pain, palpitations and leg swelling.  Gastrointestinal: Positive for abdominal pain (pelvic pain radiates to left lower quadrant ), anorexia (decreased appetitie ) and nausea (mild " queasy feeling" nop vomitted - she ate an advocodo.). Negative for abdominal distention, anal bleeding, blood in stool,  constipation, diarrhea, flatus, hematochezia, melena, rectal pain and vomiting.  Endocrine: Negative.   Genitourinary: Positive for dysuria and pelvic pain. Negative for decreased urine volume, difficulty urinating, dyspareunia, enuresis, flank pain, frequency, genital sores, hematuria, menstrual problem, urgency, vaginal bleeding, vaginal discharge and vaginal pain.  Musculoskeletal:  Negative for arthralgias, back pain, gait problem, joint swelling, myalgias, neck pain and neck stiffness.  Skin: Negative.   Allergic/Immunologic: Negative.   Neurological: Negative.  Negative for headaches.  Hematological: Negative.   Psychiatric/Behavioral: Negative.  Negative for dysphoric mood.       Objective:   Physical Exam Vitals signs reviewed.  Constitutional:      General: She is not in acute distress.    Appearance: Normal appearance. She is normal weight. She is not ill-appearing, toxic-appearing or diaphoretic.  HENT:     Head: Normocephalic and atraumatic.     Nose: Nose normal. No congestion.     Mouth/Throat:     Mouth: Mucous membranes are moist.     Pharynx: No oropharyngeal exudate.  Eyes:     General: No scleral icterus.       Right eye: No discharge.        Left eye: No discharge.     Conjunctiva/sclera: Conjunctivae normal.     Pupils: Pupils are equal, round, and reactive to light.  Cardiovascular:     Rate and Rhythm: Normal rate and regular rhythm.     Pulses: Normal pulses.     Heart sounds: Normal heart sounds. No murmur. No friction rub. No gallop.   Pulmonary:     Effort: Pulmonary effort is normal. No respiratory distress.     Breath sounds: Normal breath sounds. No stridor. No wheezing, rhonchi or rales.  Chest:     Chest wall: No tenderness.  Abdominal:     General: Abdomen is flat. There is no distension.     Palpations: Abdomen is soft. There is no shifting dullness, fluid wave, hepatomegaly, splenomegaly, mass or pulsatile mass.     Tenderness: There is abdominal tenderness in the suprapubic area and left lower quadrant. There is no right CVA tenderness, left CVA tenderness, guarding or rebound. Negative signs include Murphy's sign, Rovsing's sign, McBurney's sign, psoas sign and obturator sign.     Hernia: No hernia is present. There is no hernia in the right inguinal area or left inguinal area.       Comments: No lymph nodes palpable.   Tenderness with deep palpation in suprapubic area with mild tenderness left lower quadrant.  Patient points to vaginal area in pain as well when standing.   Lymphadenopathy:     Cervical: No cervical adenopathy.     Right cervical: No superficial, deep or posterior cervical adenopathy.    Left cervical: No superficial, deep or posterior cervical adenopathy.     Lower Body: No left inguinal adenopathy.  Skin:    General: Skin is warm.     Capillary Refill: Capillary refill takes less than 2 seconds.  Neurological:     General: No focal deficit present.     Mental Status: She is alert and oriented to person, place, and time.  Psychiatric:        Mood and Affect: Mood normal.        Behavior: Behavior normal.        Thought Content: Thought content normal.        Judgment: Judgment normal.    No gynecology exams done in this office currently and no equipment available.  Patient is aware she will have to see gynecology if needed for any pelvic/vaginal exam and will follow up with gynecology/obgyn as needed. Yearly gynecology pelvic exam recommended. Patient verbalized understanding of instructions and denies any further questions at this time.      Assessment:     Pelvic pain - Plan: POCT Urinalysis Dipstick (CPT 81002), POCT urine pregnancy, Urinalysis, Routine w reflex microscopic, CBC with Diff, Comprehensive metabolic panel, US Pelvic Complete With Transvaginal, CANCELED: US Abdomen Complete, CANCELED: US OB Transvaginal  Abdominal pain, left lower quadrant - Plan: US Pelvic Complete With Transvaginal  Hematuria, microscopic on POCT  - Plan: Urinalysis, Routine w reflex microscopic      Plan:      She is advised emergency room for pelvic exam and evaluation, patient declines emergency room due to Covid 19. She verbalizes understanding of risks/ benefits. She prefers to have outpatient ultrasound. She is aware a pelvic exam is needed and she will see her pcp for this.   STAT  pelvic transvaginal non ob ultrasound ordered - scheduled for 4:00pm and she is to be at the [facility] in [place] for [employer] registration today.   Differentials  Suspect left ovarian cyst, increased intercourse cause of pain, urinary tract infection - urine with odor and color change. Discussed possibility of diverticulum.   Results for orders placed or performed in visit on 05/09/19 (from the past 24 hour(s))  POCT Urinalysis Dipstick (CPT 81002)     Status: Abnormal   Collection Time: 05/09/19  1:38 PM  Result Value Ref Range   Color, UA Amber    Clarity, UA Slightly Cloudy    Glucose, UA Negative Negative   Bilirubin, UA 1+    Ketones, UA Negative    Spec Grav, UA >=1.030 (A) 1.010 - 1.025   Blood, UA Trace-Intact    pH, UA 5.5 5.0 - 8.0   Protein, UA Negative Negative   Urobilinogen, UA 0.2 0.2 or 1.0 E.U./dL   Nitrite, UA Negative    Leukocytes, UA Negative Negative   Appearance     Odor    POCT urine pregnancy     Status: Normal   Collection Time: 05/09/19  2:02 PM  Result Value Ref Range   Preg Test, Ur Negative Negative     Orders Placed This Encounter  Procedures  . US Pelvic Complete With Transvaginal    Standing Status:   Future    Standing Expiration Date:   07/08/2020    Order Specific Question:   Reason for Exam (SYMPTOM  OR DIAGNOSIS REQUIRED)    Answer:   hold and call for patient. rule out ovarian cyst pelvic fluid    Order Specific Question:   Preferred imaging location?    Answer:   [facility]    Order Specific Question:   Call Results- Best Contact Number?    Answer:   [phone] Hold Patient  . Urinalysis, Routine w reflex microscopic  . CBC with Diff  . Comprehensive metabolic panel  . POCT Urinalysis Dipstick (CPT 81002)  . POCT urine pregnancy   Orders Placed This Encounter  Procedures  . US Pelvic Complete With Transvaginal    Standing Status:   Future    Standing Expiration Date:   07/08/2020    Order Specific Question:   Reason for Exam  (SYMPTOM  OR DIAGNOSIS REQUIRED)    Answer:   hold and call for patient. rule out ovarian cyst pelvic fluid    Order Specific Question:  Preferred imaging location?    Answer:   [facility]    Order Specific Question:   Call Results- Best Contact Number?    Answer:   [phone] Hold Patient  . Urinalysis, Routine w reflex microscopic  . CBC with Diff  . Comprehensive metabolic panel  . POCT Urinalysis Dipstick (CPT 81002)  . POCT urine pregnancy   Meds ordered this encounter  Medications  . ciprofloxacin (CIPRO) 500 MG tablet    Sig: Take 1 tablet (500 mg total) by mouth 2 (two) times daily.    Dispense:  14 tablet    Refill:  0       Follow up with your primary care MD Virginia Crews, MD on 05/10/19- GO TO THE EMERGENCY ROOM IF ANY SYMPTOMS WORSEN OR CONTINUE.  Patient verbalized understanding of all instructions given and denies any further questions at this time.    Patient left and only CBC was drawn, she was called to come back to clinic today for her CMP that was ordered but patient declined and will call the office on 05/10/19 to come by and have this drawn,it was advised to have CMP done today.

## 2019-05-09 NOTE — Patient Instructions (Signed)
Abdominal Pain, Adult  Many things can cause belly (abdominal) pain. Most times, belly pain is not dangerous. Many cases of belly pain can be watched and treated at home. Sometimes belly pain is serious, though. Your doctor will try to find the cause of your belly pain. Follow these instructions at home:  Take over-the-counter and prescription medicines only as told by your doctor. Do not take medicines that help you poop (laxatives) unless told to by your doctor.  Drink enough fluid to keep your pee (urine) clear or pale yellow.  Watch your belly pain for any changes.  Keep all follow-up visits as told by your doctor. This is important. Contact a doctor if:  Your belly pain changes or gets worse.  You are not hungry, or you lose weight without trying.  You are having trouble pooping (constipated) or have watery poop (diarrhea) for more than 2-3 days.  You have pain when you pee or poop.  Your belly pain wakes you up at night.  Your pain gets worse with meals, after eating, or with certain foods.  You are throwing up and cannot keep anything down.  You have a fever. Get help right away if:  Your pain does not go away as soon as your doctor says it should.  You cannot stop throwing up.  Your pain is only in areas of your belly, such as the right side or the left lower part of the belly.  You have bloody or black poop, or poop that looks like tar.  You have very bad pain, cramping, or bloating in your belly.  You have signs of not having enough fluid or water in your body (dehydration), such as: ? Dark pee, very little pee, or no pee. ? Cracked lips. ? Dry mouth. ? Sunken eyes. ? Sleepiness. ? Weakness. This information is not intended to replace advice given to you by your health care provider. Make sure you discuss any questions you have with your health care provider. Document Released: 05/16/2008 Document Revised: 06/17/2016 Document Reviewed: 05/11/2016 Elsevier  Interactive Patient Education  2019 [place]. Pelvic Pain, Female Pelvic pain is pain in your lower belly (abdomen), below your belly button and between your hips. The pain may start suddenly (be acute), keep coming back (be recurring), or last a long time (become chronic). Pelvic pain that lasts longer than 6 months is called chronic pelvic pain. There are many causes of pelvic pain. Sometimes the cause of pelvic pain is not known. Follow these instructions at home:   Take over-the-counter and prescription medicines only as told by your doctor.  Rest as told by your doctor.  Do not have sex if it hurts.  Keep a journal of your pelvic pain. Write down: ? When the pain started. ? Where the pain is located. ? What seems to make the pain better or worse, such as food or your period (menstrual cycle). ? Any symptoms you have along with the pain.  Keep all follow-up visits as told by your doctor. This is important. Contact a doctor if:  Medicine does not help your pain.  Your pain comes back.  You have new symptoms.  You have unusual discharge or bleeding from your vagina.  You have a fever or chills.  You are having trouble pooping (constipation).  You have blood in your pee (urine) or poop (stool).  Your pee smells bad.  You feel weak or light-headed. Get help right away if:  You have sudden pain that  is very bad.  Your pain keeps getting worse.  You have very bad pain and also have any of these symptoms: ? A fever. ? Feeling sick to your stomach (nausea). ? Throwing up (vomiting). ? Being very sweaty.  You pass out (lose consciousness). Summary  Pelvic pain is pain in your lower belly (abdomen), below your belly button and between your hips.  There are many possible causes of pelvic pain.  Keep a journal of your pelvic pain. This information is not intended to replace advice given to you by your health care provider. Make sure you discuss any questions you  have with your health care provider. Document Released: 05/16/2008 Document Revised: 05/16/2018 Document Reviewed: 05/16/2018 Elsevier Interactive Patient Education  2019 [employer].

## 2019-05-10 NOTE — Progress Notes (Signed)
[name],  I received your CBC back and there is no sign of infection.  Your MCV is mildly elevated, MCH mildly elevated, RDW is mildy low, looks as if this has been consistent with previous and is most likely caused by a Vitamin B12 or folate deficency. I recommend you follow up with your Brita Romp, Dionne Bucy, MD regarding this and remember she can place orders and [facility].  If you would like to return to the office for the CMP that was not drawn, just let us know.  I did not get any results for your pelvic ultrasound that was scheduled for pm yesterday at [facility]- did you decide not to go?  I hope you are feeling better and your urine culture is still pending- I will let you know what this is when it returns. Continue office visit plan as discussed.

## 2019-05-13 NOTE — Telephone Encounter (Signed)
Patient scheduled on 05/16/2019

## 2019-05-16 ENCOUNTER — Ambulatory Visit (INDEPENDENT_AMBULATORY_CARE_PROVIDER_SITE_OTHER): Payer: Managed Care, Other (non HMO) | Admitting: Family Medicine

## 2019-05-16 ENCOUNTER — Encounter: Payer: Self-pay | Admitting: Family Medicine

## 2019-05-16 DIAGNOSIS — F331 Major depressive disorder, recurrent, moderate: Secondary | ICD-10-CM | POA: Diagnosis not present

## 2019-05-16 DIAGNOSIS — F902 Attention-deficit hyperactivity disorder, combined type: Secondary | ICD-10-CM

## 2019-05-16 DIAGNOSIS — F5104 Psychophysiologic insomnia: Secondary | ICD-10-CM

## 2019-05-16 DIAGNOSIS — F411 Generalized anxiety disorder: Secondary | ICD-10-CM | POA: Diagnosis not present

## 2019-05-16 MED ORDER — AMPHETAMINE-DEXTROAMPHETAMINE 15 MG PO TABS: 15.0000 mg | ORAL_TABLET | Freq: Two times a day (BID) | ORAL | 0 refills | Status: DC

## 2019-05-16 MED ORDER — BUPROPION HCL 75 MG PO TABS: 75.0000 mg | ORAL_TABLET | Freq: Two times a day (BID) | ORAL | 2 refills | Status: DC

## 2019-05-16 NOTE — Assessment & Plan Note (Signed)
Well controlled Related to PTSD Continue trazodone

## 2019-05-16 NOTE — Assessment & Plan Note (Signed)
Stable and well controlled No medication side effects Continue Adderall F/u in 39months

## 2019-05-16 NOTE — Assessment & Plan Note (Signed)
Well controlled Continue wellbutrin at current dose Repeat PHQ9 and GAD7 at next visit

## 2019-05-16 NOTE — Progress Notes (Signed)
Patient: [name] Female    DOB: 15-Jul-1976   43 y.o.   MRN: [id number] Visit Date: 05/16/2019  Today's Provider: Lavon Paganini, MD   Chief Complaint  Patient presents with  . Anxiety  . Depression  . ADD   Subjective:    Virtual Visit via Video Note  I connected with [name] on 05/16/19 at 11:00 AM EDT by a video enabled telemedicine application and verified that I am speaking with the correct person using two identifiers.  Patient location: home Provider location: [place] involved in the visit: patient, provider    I discussed the limitations of evaluation and management by telemedicine and the availability of in person appointments. The patient expressed understanding and agreed to proceed.  HPI Anxiety & Depression:  Patient presents for a 1 month follow up. Last OV was on 03/14/2019. Patient advised to continue Wellbutrin 75 mg. She reports good compliance with treatment plan. She states symptoms are gradually improving. She states symptoms are sometimes better than other times.   Having difficulty thinking about son going to college in the fall.  She is a single mom.  She is also very frustrated and aggravated at work.  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  Feels like she is carrying other people's weight.  GAD 7 : Generalized Anxiety Score 05/16/2019 04/12/2019 03/14/2019  Nervous, Anxious, on Edge 2 1 2   Control/stop worrying 1 0 2  Worry too much - different things 1 0 3  Trouble relaxing 1 2 3   Restless 1 2 3   Easily annoyed or irritable 3 3 3   Afraid - awful might happen 1 0 0  Total GAD 7 Score 10 8 16   Anxiety Difficulty Somewhat difficult Somewhat difficult Somewhat difficult   Depression screen [facility] 2/9 05/16/2019 04/12/2019 03/14/2019  Decreased Interest 1 2 2   Down, Depressed, Hopeless 2 2 2   PHQ - 2 Score 3 4 4   Altered sleeping 1 2 1   Tired, decreased energy 0 2 3   Change in appetite 0 2 3  Feeling bad or failure about yourself  0 2 2  Trouble concentrating 0 1 2  Moving slowly or fidgety/restless 0 0 2  Suicidal thoughts 0 0 0  PHQ-9 Score 4 13 17   Difficult doing work/chores Somewhat difficult Somewhat difficult Somewhat difficult     Follow up forADD  The patient was last seen for this34monthsago. Changes made at last visit includescontinuing Adderall.  Shereports goodcompliance with treatment. Shefeels that condition isstable. Sheis nothaving side effects.   Allergies  Allergen Reactions  . Penicillins Swelling    Pt states caused throat to swell   . Cefdinir Palpitations  . Sulfa Antibiotics Other (See Comments)    Lips tingled     Current Outpatient Medications:  .  albuterol (PROVENTIL) (2.5 MG/3ML) 0.083% nebulizer solution, Take 3 mLs (2.5 mg total) by nebulization every 6 (six) hours as needed for wheezing or shortness of breath., Disp: 150 mL, Rfl: 1 .  albuterol (VENTOLIN HFA) 108 (90 Base) MCG/ACT inhaler, INHALE 2 PUFFS INTO THE LUNGS EVERY 6 HOURS AS NEEDED FOR WHEEZING OR SHORTNESS OF BREATH, Disp: 8.5 g, Rfl: 2 .  amphetamine-dextroamphetamine (ADDERALL) 15 MG tablet, Take 1 tablet by mouth 2 (two) times daily., Disp: 60 tablet, Rfl: 0 .  amphetamine-dextroamphetamine (ADDERALL) 15 MG tablet, Take 1 tablet by mouth 2 (two) times daily., Disp: 60 tablet, Rfl:  0 .  amphetamine-dextroamphetamine (ADDERALL) 15 MG tablet, Take 1 tablet by mouth 2 (two) times daily., Disp: 60 tablet, Rfl: 0 .  buPROPion (WELLBUTRIN) 75 MG tablet, Take 1 tablet (75 mg total) by mouth 2 (two) times daily., Disp: 60 tablet, Rfl: 2 .  ciprofloxacin (CIPRO) 500 MG tablet, Take 1 tablet (500 mg total) by mouth 2 (two) times daily., Disp: 14 tablet, Rfl: 0 .  liothyronine (CYTOMEL) 5 MCG tablet, Take 25 mcg by mouth daily. , Disp: , Rfl:  .  meloxicam (MOBIC) 15 MG tablet, TAKE 1 TABLET BY MOUTH EVERY DAY, Disp: 90 tablet, Rfl: 3 .   MICROGESTIN FE 1/20 1-20 MG-MCG tablet, , Disp: , Rfl:  .  montelukast (SINGULAIR) 10 MG tablet, , Disp: , Rfl:  .  naratriptan (AMERGE) 2.5 MG tablet, Take 2.5 mg by mouth as needed for migraine. Take one (1) tablet at onset of headache; if returns or does not resolve, may repeat after 4 hours; do not exceed five (5) mg in 24 hours., Disp: , Rfl:  .  SUMAtriptan (IMITREX) 100 MG tablet, TK 1 T PO AOS OF HEADACHE. REPEAT IN 2 H AS NEEDED. MAX DOSE 2 T IN 24 H, Disp: , Rfl:  .  traZODone (DESYREL) 150 MG tablet, TAKE 1/2 TABLET(75 MG) BY MOUTH AT BEDTIME, Disp: 45 tablet, Rfl: 11  Review of Systems  Constitutional: Negative.   Respiratory: Negative.   Cardiovascular: Negative.   Musculoskeletal: Negative.   Psychiatric/Behavioral: Positive for decreased concentration. The patient is nervous/anxious.     Social History   Tobacco Use  . Smoking status: Never Smoker  . Smokeless tobacco: Never Used  Substance Use Topics  . Alcohol use: Yes    Alcohol/week: 0.0 standard drinks    Comment: rare- wine       Objective:   LMP 04/30/2019  There were no vitals filed for this visit.  Physical Exam Constitutional:      Appearance: Normal appearance.  Pulmonary:     Effort: Pulmonary effort is normal. No respiratory distress.  Neurological:     Mental Status: She is alert and oriented to person, place, and time. Mental status is at baseline.  Psychiatric:        Mood and Affect: Mood normal.        Behavior: Behavior normal.       Assessment & Plan    I discussed the assessment and treatment plan with the patient. The patient was provided an opportunity to ask questions and all were answered. The patient agreed with the plan and demonstrated an understanding of the instructions.   The patient was advised to call back or seek an in-person evaluation if the symptoms worsen or if the condition fails to improve as anticipated.   Problem List Items Addressed This Visit      Other    ADD (attention deficit disorder) - Primary    Stable and well controlled No medication side effects Continue Adderall F/u in 79months      Insomnia    Well controlled Related to PTSD Continue trazodone      GAD (generalized anxiety disorder)    Well controlled Continue wellbutrin at current dose Have discussed SSRIs, but patient wary of potential side effects      Relevant Medications   buPROPion (WELLBUTRIN) 75 MG tablet   Moderate episode of recurrent major depressive disorder ([place])    Well controlled Continue wellbutrin at current dose Repeat PHQ9 and GAD7 at next visit  Relevant Medications   buPROPion (WELLBUTRIN) 75 MG tablet       Return in about 3 months (around 08/16/2019) for ADD and depression/anxiety f/u.   The entirety of the information documented in the History of Present Illness, Review of Systems and Physical Exam were personally obtained by me. Portions of this information were initially documented by Tiburcio Pea, CMA and reviewed by me for thoroughness and accuracy.    , Dionne Bucy, MD MPH [facility]

## 2019-05-16 NOTE — Assessment & Plan Note (Signed)
Well controlled Continue wellbutrin at current dose Have discussed SSRIs, but patient wary of potential side effects

## 2019-05-23 ENCOUNTER — Other Ambulatory Visit: Payer: Self-pay | Admitting: Family Medicine

## 2019-05-24 ENCOUNTER — Ambulatory Visit: Payer: Self-pay | Admitting: Family Medicine

## 2019-06-05 ENCOUNTER — Encounter: Payer: Self-pay | Admitting: Family Medicine

## 2019-06-19 ENCOUNTER — Telehealth: Payer: Self-pay

## 2019-06-19 ENCOUNTER — Ambulatory Visit (INDEPENDENT_AMBULATORY_CARE_PROVIDER_SITE_OTHER): Payer: Managed Care, Other (non HMO) | Admitting: Family Medicine

## 2019-06-19 DIAGNOSIS — J0101 Acute recurrent maxillary sinusitis: Secondary | ICD-10-CM | POA: Diagnosis not present

## 2019-06-19 MED ORDER — DOXYCYCLINE HYCLATE 100 MG PO TABS: 100.0000 mg | ORAL_TABLET | Freq: Two times a day (BID) | ORAL | 0 refills | Status: AC

## 2019-06-19 MED ORDER — FLUCONAZOLE 150 MG PO TABS: 150.0000 mg | ORAL_TABLET | Freq: Once | ORAL | 0 refills | Status: AC

## 2019-06-19 NOTE — Telephone Encounter (Signed)
Patient appointment was schedule.

## 2019-06-19 NOTE — Patient Instructions (Signed)
Sinusitis, Adult Sinusitis is inflammation of your sinuses. Sinuses are hollow spaces in the bones around your face. Your sinuses are located:  Around your eyes.  In the middle of your forehead.  Behind your nose.  In your cheekbones. Mucus normally drains out of your sinuses. When your nasal tissues become inflamed or swollen, mucus can become trapped or blocked. This allows bacteria, viruses, and fungi to grow, which leads to infection. Most infections of the sinuses are caused by a virus. Sinusitis can develop quickly. It can last for up to 4 weeks (acute) or for more than 12 weeks (chronic). Sinusitis often develops after a cold. What are the causes? This condition is caused by anything that creates swelling in the sinuses or stops mucus from draining. This includes:  Allergies.  Asthma.  Infection from bacteria or viruses.  Deformities or blockages in your nose or sinuses.  Abnormal growths in the nose (nasal polyps).  Pollutants, such as chemicals or irritants in the air.  Infection from fungi (rare). What increases the risk? You are more likely to develop this condition if you:  Have a weak body defense system (immune system).  Do a lot of swimming or diving.  Overuse nasal sprays.  Smoke. What are the signs or symptoms? The main symptoms of this condition are pain and a feeling of pressure around the affected sinuses. Other symptoms include:  Stuffy nose or congestion.  Thick drainage from your nose.  Swelling and warmth over the affected sinuses.  Headache.  Upper toothache.  A cough that may get worse at night.  Extra mucus that collects in the throat or the back of the nose (postnasal drip).  Decreased sense of smell and taste.  Fatigue.  A fever.  Sore throat.  Bad breath. How is this diagnosed? This condition is diagnosed based on:  Your symptoms.  Your medical history.  A physical exam.  Tests to find out if your condition is  acute or chronic. This may include: ? Checking your nose for nasal polyps. ? Viewing your sinuses using a device that has a light (endoscope). ? Testing for allergies or bacteria. ? Imaging tests, such as an MRI or CT scan. In rare cases, a bone biopsy may be done to rule out more serious types of fungal sinus disease. How is this treated? Treatment for sinusitis depends on the cause and whether your condition is chronic or acute.  If caused by a virus, your symptoms should go away on their own within 10 days. You may be given medicines to relieve symptoms. They include: ? Medicines that shrink swollen nasal passages (topical intranasal decongestants). ? Medicines that treat allergies (antihistamines). ? A spray that eases inflammation of the nostrils (topical intranasal corticosteroids). ? Rinses that help get rid of thick mucus in your nose (nasal saline washes).  If caused by bacteria, your health care provider may recommend waiting to see if your symptoms improve. Most bacterial infections will get better without antibiotic medicine. You may be given antibiotics if you have: ? A severe infection. ? A weak immune system.  If caused by narrow nasal passages or nasal polyps, you may need to have surgery. Follow these instructions at home: Medicines  Take, use, or apply over-the-counter and prescription medicines only as told by your health care provider. These may include nasal sprays.  If you were prescribed an antibiotic medicine, take it as told by your health care provider. Do not stop taking the antibiotic even if you start   to feel better. Hydrate and humidify   Drink enough fluid to keep your urine pale yellow. Staying hydrated will help to thin your mucus.  Use a cool mist humidifier to keep the humidity level in your home above 50%.  Inhale steam for 10-15 minutes, 3-4 times a day, or as told by your health care provider. You can do this in the bathroom while a hot shower is  running.  Limit your exposure to cool or dry air. Rest  Rest as much as possible.  Sleep with your head raised (elevated).  Make sure you get enough sleep each night. General instructions   Apply a warm, moist washcloth to your face 3-4 times a day or as told by your health care provider. This will help with discomfort.  Wash your hands often with soap and water to reduce your exposure to germs. If soap and water are not available, use hand sanitizer.  Do not smoke. Avoid being around people who are smoking (secondhand smoke).  Keep all follow-up visits as told by your health care provider. This is important. Contact a health care provider if:  You have a fever.  Your symptoms get worse.  Your symptoms do not improve within 10 days. Get help right away if:  You have a severe headache.  You have persistent vomiting.  You have severe pain or swelling around your face or eyes.  You have vision problems.  You develop confusion.  Your neck is stiff.  You have trouble breathing. Summary  Sinusitis is soreness and inflammation of your sinuses. Sinuses are hollow spaces in the bones around your face.  This condition is caused by nasal tissues that become inflamed or swollen. The swelling traps or blocks the flow of mucus. This allows bacteria, viruses, and fungi to grow, which leads to infection.  If you were prescribed an antibiotic medicine, take it as told by your health care provider. Do not stop taking the antibiotic even if you start to feel better.  Keep all follow-up visits as told by your health care provider. This is important. This information is not intended to replace advice given to you by your health care provider. Make sure you discuss any questions you have with your health care provider. Document Released: 11/28/2005 Document Revised: 04/30/2018 Document Reviewed: 04/30/2018 Elsevier Patient Education  2020 [employer]  

## 2019-06-19 NOTE — Telephone Encounter (Signed)
Patient thinks she has a sinus infection and there are no appointments left today.  She said you know that she gets these often and wanted to know if you would call her in something.  CB# [place]

## 2019-06-19 NOTE — Progress Notes (Signed)
Patient: [name] Female    DOB: [birth year]   43 y.o.   MRN: [id number] Visit Date: 06/19/2019  Today's Provider: Lavon Paganini, MD   Chief Complaint  Patient presents with  . Sinus Problem   Subjective:    I, [name] CMA, am acting as a scribe for Lavon Paganini, MD.  Virtual Visit via Video Note  I connected with [name] on 06/19/19 at  2:40 PM EDT by a video enabled telemedicine application and verified that I am speaking with the correct person using two identifiers.   Patient location: Work Provider location: [employer] Persons involved in the visit: patient, provider     I discussed the limitations of evaluation and management by telemedicine and the availability of in person appointments. The patient expressed understanding and agreed to proceed.  Interactive audio and video communications were attempted, although failed due to patient's inability to connect to video. Continued visit with audio only interaction with patient agreement.   Sinus Problem This is a recurrent problem. The current episode started 1 to 4 weeks ago. There has been no fever. Associated symptoms include headaches and sinus pressure. Pertinent negatives include no chills, congestion, coughing, shortness of breath, sneezing or sore throat.   Reports that bilateral ethmoid and frontal sinus pressure and congestion along with headache started more than a week ago.  She then developed significant worsening and right-sided maxillary pain and swelling yesterday  She denies any other symptoms or any high risk COVID exposure This is similar to previous sinusitis which is a recurrent problem from her and she is seen ENT for this previously  Allergies  Allergen Reactions  . Penicillins Swelling    Pt states caused throat to swell   . Cefdinir Palpitations  . Sulfa Antibiotics Other (See Comments)    Lips tingled     Current Outpatient Medications:   .  albuterol (PROVENTIL) (2.5 MG/3ML) 0.083% nebulizer solution, Take 3 mLs (2.5 mg total) by nebulization every 6 (six) hours as needed for wheezing or shortness of breath., Disp: 150 mL, Rfl: 1 .  albuterol (VENTOLIN HFA) 108 (90 Base) MCG/ACT inhaler, INHALE 2 PUFFS INTO THE LUNGS EVERY 6 HOURS AS NEEDED FOR WHEEZING OR SHORTNESS OF BREATH, Disp: 8.5 g, Rfl: 2 .  amphetamine-dextroamphetamine (ADDERALL) 15 MG tablet, Take 1 tablet by mouth 2 (two) times daily., Disp: 60 tablet, Rfl: 0 .  amphetamine-dextroamphetamine (ADDERALL) 15 MG tablet, Take 1 tablet by mouth 2 (two) times daily., Disp: 60 tablet, Rfl: 0 .  amphetamine-dextroamphetamine (ADDERALL) 15 MG tablet, Take 1 tablet by mouth 2 (two) times daily., Disp: 60 tablet, Rfl: 0 .  buPROPion (WELLBUTRIN) 75 MG tablet, Take 1 tablet (75 mg total) by mouth 2 (two) times daily., Disp: 60 tablet, Rfl: 2 .  ciprofloxacin (CIPRO) 500 MG tablet, Take 1 tablet (500 mg total) by mouth 2 (two) times daily., Disp: 14 tablet, Rfl: 0 .  fluticasone furoate-vilanterol (BREO ELLIPTA) 100-25 MCG/INH AEPB, INL 1 PUFF ITL D, Disp: , Rfl:  .  liothyronine (CYTOMEL) 5 MCG tablet, Take 25 mcg by mouth daily. , Disp: , Rfl:  .  meloxicam (MOBIC) 15 MG tablet, TAKE 1 TABLET BY MOUTH EVERY DAY, Disp: 90 tablet, Rfl: 3 .  MICROGESTIN FE 1/20 1-20 MG-MCG tablet, , Disp: , Rfl:  .  montelukast (SINGULAIR) 10 MG tablet, TAKE 1 TABLET(10 MG) BY MOUTH AT BEDTIME, Disp: 30 tablet, Rfl: 11 .  naratriptan (AMERGE) 2.5  MG tablet, Take 2.5 mg by mouth as needed for migraine. Take one (1) tablet at onset of headache; if returns or does not resolve, may repeat after 4 hours; do not exceed five (5) mg in 24 hours., Disp: , Rfl:  .  SUMAtriptan (IMITREX) 100 MG tablet, TK 1 T PO AOS OF HEADACHE. REPEAT IN 2 H AS NEEDED. MAX DOSE 2 T IN 24 H, Disp: , Rfl:  .  traZODone (DESYREL) 150 MG tablet, TAKE 1/2 TABLET(75 MG) BY MOUTH AT BEDTIME, Disp: 45 tablet, Rfl: 11  Review of Systems   Constitutional: Negative for chills.  HENT: Positive for dental problem (tooth pain per pt), postnasal drip (drainage per pt), sinus pressure and sinus pain. Negative for congestion, sneezing and sore throat.   Respiratory: Negative for cough and shortness of breath.   Neurological: Positive for headaches.    Social History   Tobacco Use  . Smoking status: Never Smoker  . Smokeless tobacco: Never Used  Substance Use Topics  . Alcohol use: Yes    Alcohol/week: 0.0 standard drinks    Comment: rare- wine       Objective:   There were no vitals taken for this visit. There were no vitals filed for this visit.   Physical Exam   No results found for any visits on 06/19/19.     Assessment & Plan    I discussed the assessment and treatment plan with the patient. The patient was provided an opportunity to ask questions and all were answered. The patient agreed with the plan and demonstrated an understanding of the instructions.   The patient was advised to call back or seek an in-person evaluation if the symptoms worsen or if the condition fails to improve as anticipated.   1. Acute recurrent maxillary sinusitis - symptoms  c/w sinusitis   - no evidence of AOM, CAP, strep pharyngitis, or other infection - given duration of symptoms, suspect bacterial etiology - will treat with acyclovir x7d - discussed symptomatic management (flonase, decongestants, etc), natural course, and return precautions   -May need to follow-up with ENT regarding the recurrence of this -Do not suspect COVID at this time, but discussed that if this is not improving with antibiotics he should be tested -Prescription given for Diflucan to take if she gets a yeast infection from the antibiotic use, which is a typical problem for her after abx    Meds ordered this encounter  Medications  . fluconazole (DIFLUCAN) 150 MG tablet    Sig: Take 1 tablet (150 mg total) by mouth once for 1 dose. May repeat in 3  days if not improved    Dispense:  2 tablet    Refill:  0  . doxycycline (VIBRA-TABS) 100 MG tablet    Sig: Take 1 tablet (100 mg total) by mouth 2 (two) times daily for 7 days.    Dispense:  14 tablet    Refill:  0     Return if symptoms worsen or fail to improve.   The entirety of the information documented in the History of Present Illness, Review of Systems and Physical Exam were personally obtained by me. Portions of this information were initially documented by [facility], CMA and reviewed by me for thoroughness and accuracy.    , Dionne Bucy, MD MPH [facility]

## 2019-06-19 NOTE — Telephone Encounter (Signed)
Can double book the 240 injection for an evisit for her.  Really needs visit to determine need for abx

## 2019-07-08 ENCOUNTER — Telehealth: Payer: Self-pay | Admitting: Family Medicine

## 2019-07-08 DIAGNOSIS — E039 Hypothyroidism, unspecified: Secondary | ICD-10-CM

## 2019-07-08 NOTE — Telephone Encounter (Signed)
Pt is requesting referral to [facility] for hypothyroidism. Pt stated that she feels she needs to see a specialist instead of just women's health. Please advise. Thanks TNP

## 2019-07-08 NOTE — Telephone Encounter (Signed)
Referral placed.

## 2019-07-08 NOTE — Telephone Encounter (Signed)
Ok to place referral.

## 2019-08-06 ENCOUNTER — Other Ambulatory Visit: Payer: Self-pay | Admitting: Family Medicine

## 2019-08-06 MED ORDER — BUPROPION HCL 75 MG PO TABS: 75.0000 mg | ORAL_TABLET | Freq: Two times a day (BID) | ORAL | 2 refills | Status: DC

## 2019-08-06 NOTE — Telephone Encounter (Signed)
[facility] faxed refill request for the following medications:   buPROPion (WELLBUTRIN) 75 MG tablet   Please advise.  

## 2019-08-14 ENCOUNTER — Encounter: Payer: Self-pay | Admitting: Adult Health

## 2019-08-14 ENCOUNTER — Other Ambulatory Visit: Payer: Self-pay

## 2019-08-14 ENCOUNTER — Ambulatory Visit: Payer: Managed Care, Other (non HMO) | Admitting: Adult Health

## 2019-08-14 DIAGNOSIS — J329 Chronic sinusitis, unspecified: Secondary | ICD-10-CM

## 2019-08-14 DIAGNOSIS — Z20828 Contact with and (suspected) exposure to other viral communicable diseases: Secondary | ICD-10-CM

## 2019-08-14 DIAGNOSIS — Z20822 Contact with and (suspected) exposure to covid-19: Secondary | ICD-10-CM

## 2019-08-14 DIAGNOSIS — Z7189 Other specified counseling: Secondary | ICD-10-CM

## 2019-08-14 MED ORDER — LEVOFLOXACIN 500 MG PO TABS: 500.0000 mg | ORAL_TABLET | Freq: Every day | ORAL | 0 refills | Status: DC

## 2019-08-14 MED ORDER — FLUCONAZOLE 150 MG PO TABS: 150.0000 mg | ORAL_TABLET | Freq: Once | ORAL | 0 refills | Status: AC

## 2019-08-14 NOTE — Progress Notes (Addendum)
Virtual Visit via Telephone Note  I connected with [name] on 08/14/19 at 10:30 AM EDT by telephone and verified that I am speaking with the correct person using two identifiers.  Location: Patient: ay home  Provider: [facility]   I discussed the limitations, risks, security and privacy concerns of performing an evaluation and management service by telephone and the availability of in person appointments. I also discussed with the patient that there may be a patient responsible charge related to this service. The patient expressed understanding and agreed to proceed. Patient was reviewed the risk as stated above and consents are virtual visit during the Covid pandemic office protocol.  She is aware that she should seek in person evaluation should any symptoms change or worsen at any time or if she would like an in person evaluation that can be arranged and office is giving that are seeing COVID patients will be provided.   History of Present Illness:  Onset of symptoms 08/05/2019   Patient is a 43 year old female in no acute distress with nasal congestion over the past 7 days, reports green discharge, history of septal collapse.  Nasal pressure, maxillary and frontal sinus pressure.  Patient denies cough however see note under objective.  She has a chronic history of sinusitis. Had sinusitis 06/19/19 diagnosed at her primary care providers she was on Doxycycline and this resolved completely she reports.  She reports that she saw  Dr. Amadeo Garnet ear nose and throat provider she saw two weeks ago was not having symptoms at that time.  CT scan. 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 Carlis Abbott surgeon for ENT who does this and she has an appointment in 3 weeks.  Patient reports she has not taken any antihistamines or  nasal sprays.  She was allergy tested reports " I have no allergies at all I was allergy tested and told that I did not need to take an antihistamine or use any Flonase spray."  Denies any blood from her nasal passages.  Pain rated 2 out of 10 and this is related to sinus pressure/ frontal she reports.  She denies any problems with breathing or any respiratory distress. Patient  denies any fever, body aches,chills, rash, chest pain, shortness of breath, nausea, vomiting, or diarrhea.  Patient denies any chance of pregnancy.  LMP 08/11/2019.  She is not lactating. Reports she has no availability to vital signs during this visit.  She reports that she has been afebrile and denies fever today. See patient answers as follows:  Patient Questionnaire Submission --------------------------------  Questionnaire: MYCHART [facility] AMB E-VISIT SINUS Question: Have you recently been around anyone who has been diagnosed with Corona virus? Answer:   No Question: Which of the following have you been experiencing? Answer:   Congested nose           Pain around the nose and face           Headache Question: Have these symptoms significantly worsened over the last two to three days? Answer:   Yes Question: Have you had any of the following? Answer:   None of the above Questionnaire: MYCHART E-VISIT SINUS 3 Question: How long have you been having these symptoms? Answer:   7 or more days Question: Do you have a fever? Answer:   No,  I do not have a fever Questionnaire: MYCHART E-VISIT SINUS 6 Do you smoke? Answer:   No Questionnaire: MYCHART E-VISIT SINUS 7 Question: Have you ever smoked? Answer:   I have never smoked Questionnaire: MYCHART E-VISIT SINUS 8 Question: Do you have any chronic illnesses, such as diabetes, heart disease, kidney disease, or lung disease, or any illness that would weaken your body's ability to fight infection? Answer:   Yes Questionnaire: MYCHART E-VISIT SINUS 9 Question: Please  enter details of your other illness. 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 Questionnaire: MYCHART E-VISIT SINUS 10 Question: When you blow your nose, what color is the mucus? Answer:   Mostly thick and yellow or green Question: Have you experienced similar problems in the past? Answer:   Yes Questionnaire: MYCHART E-VISIT SINUS 11 Question: What treatments have worked in the past?  Answer:   Antibiotics. Question: What treatment(s) in the past have been unsuccessful? Answer:   Milta Deiters Med irrigation - due to the nasal collapse, it won't go through my nose. Questionnaire: MYCHART E-VISIT SINUS 12 Question: Is this illness similar to previous illnesses you have had?  How is it the same?  How is it different? Answer:   Yes - chronic sinusitis. I had septoplasty and ballonplasty 3.5 years ago but the septum is still severely deviated and now I have nasal collapse in my right nostril and a partial in my left nostril. Due to this, mucus gets stuck in a couple of different areas and can't drain properly so it leads to infection. Questionnaire: MYCHART E-VISIT SINUS 13 Question: Have you recently been hospitalized? Answer:   No Questionnaire: MYCHART E-VISIT SINUS 15 Question: What medications are you currently taking for these symptoms? Answer:   Pain medicine Questionnaire: MYCHART E-VISIT SINUS 16 Question: Please enter the names of any medications you are taking, or any other treatments you are trying. Answer:   Trazodone, Microgestin, Singulair, Estrdiol, Liothyronine (T3), Meloxicam, Bupropion, Adderall Questionnaire: MYCHART E-VISIT SINUS 17 Question: Are you pregnant? Answer:   I am confident that I  am not pregnant Question: Are you breastfeeding? Answer:   No Question: Please list your medication allergies that you may have ? (If 'none' , please list as 'none') Answer:   Penicillin, Sulfa drugs Question: Please list any additional comments  Answer:     Observations/Objective:   Patient is alert and oriented and responsive to questions Engages in conversation with provider. Speaks in full sentences without any pauses without any shortness of breath or distress.   She does have an occasional cough on the phone, she is able to speak 15+ words without any distress noted on the telephone. Assessment and Plan:  1. Educated About Covid-19 Virus Infection   2. Advice Given About Covid-19 Virus by Telephone   3. Exposure to Covid-19 Virus   4. Chronic sinusitis, unspecified location    Meds ordered this encounter  Medications  . levofloxacin (LEVAQUIN) 500 MG tablet    Sig: Take 1 tablet (500 mg total) by mouth daily.    Dispense:  10 tablet    Refill:  0  . fluconazole (DIFLUCAN) 150 MG tablet    Sig: Take 1 tablet (150 mg  total) by mouth once for 1 dose. Only take if antibiotic induced vaginal yeast occurs/ can cause liver damage    Dispense:  1 tablet    Refill:  0    Follow Up Instructions: He is advised that she should follow-up with her ear nose and throat physician given her chronic symptoms within 1 week. She is advised that she should have COVID tested due to the outbreak [place] [facility] [place] and she is an Garment/textile technologist. We will do quarantine from date of onset of symptoms 08/05/2019 so she may return to work on 08/21/2019.  COVID test was ordered.  Work note was sent to [employer] for patient to provide to her employer as she would like.  Will treat for sinusitis given patient's history. She was advised of the black box warning for Levaquin and signs and symptoms to watch for to discontinue medication and follow-up with her primary care provider or her specialist  if needed.  She also requests Diflucan, she reports that she has not taken any Diflucan in the last 4 months.  She denies any liver disease.  Offered Terazol cream but patient declined.  Given risk versus benefits and patient prefers to have Diflucan.  1 tablet was given and she is advised only to take it if vaginal induced antibiotic yeast infection occurs.  If symptoms persist follow-up as needed.  I discussed the assessment and treatment plan with the patient. The patient was provided an opportunity to ask questions and all were answered. The patient agreed with the plan and demonstrated an understanding of the instructions.   The patient was advised to call back or seek an in-person evaluation if the symptoms worsen or if the condition fails to improve as anticipated.  I provided 15 minutes of non-face-to-face time during this encounter.   Marcille Buffy, FNP

## 2019-08-14 NOTE — Patient Instructions (Signed)
Sinusitis, Adult Sinusitis is soreness and swelling (inflammation) of your sinuses. Sinuses are hollow spaces in the bones around your face. They are located:  Around your eyes.  In the middle of your forehead.  Behind your nose.  In your cheekbones. Your sinuses and nasal passages are lined with a fluid called mucus. Mucus drains out of your sinuses. Swelling can trap mucus in your sinuses. This lets germs (bacteria, virus, or fungus) grow, which leads to infection. Most of the time, this condition is caused by a virus. What are the causes? This condition is caused by:  Allergies.  Asthma.  Germs.  Things that block your nose or sinuses.  Growths in the nose (nasal polyps).  Chemicals or irritants in the air.  Fungus (rare). What increases the risk? You are more likely to develop this condition if:  You have a weak body defense system (immune system).  You do a lot of swimming or diving.  You use nasal sprays too much.  You smoke. What are the signs or symptoms? The main symptoms of this condition are pain and a feeling of pressure around the sinuses. Other symptoms include:  Stuffy nose (congestion).  Runny nose (drainage).  Swelling and warmth in the sinuses.  Headache.  Toothache.  A cough that may get worse at night.  Mucus that collects in the throat or the back of the nose (postnasal drip).  Being unable to smell and taste.  Being very tired (fatigue).  A fever.  Sore throat.  Bad breath. How is this diagnosed? This condition is diagnosed based on:  Your symptoms.  Your medical history.  A physical exam.  Tests to find out if your condition is short-term (acute) or long-term (chronic). Your doctor may: ? Check your nose for growths (polyps). ? Check your sinuses using a tool that has a light (endoscope). ? Check for allergies or germs. ? Do imaging tests, such as an MRI or CT scan. How is this treated? Treatment for this  condition depends on the cause and whether it is short-term or long-term.  If caused by a virus, your symptoms should go away on their own within 10 days. You may be given medicines to relieve symptoms. They include: ? Medicines that shrink swollen tissue in the nose. ? Medicines that treat allergies (antihistamines). ? A spray that treats swelling of the nostrils. ? Rinses that help get rid of thick mucus in your nose (nasal saline washes).  If caused by bacteria, your doctor may wait to see if you will get better without treatment. You may be given antibiotic medicine if you have: ? A very bad infection. ? A weak body defense system.  If caused by growths in the nose, you may need to have surgery. Follow these instructions at home: Medicines  Take, use, or apply over-the-counter and prescription medicines only as told by your doctor. These may include nasal sprays.  If you were prescribed an antibiotic medicine, take it as told by your doctor. Do not stop taking the antibiotic even if you start to feel better. Hydrate and humidify   Drink enough water to keep your pee (urine) pale yellow.  Use a cool mist humidifier to keep the humidity level in your home above 50%.  Breathe in steam for 10-15 minutes, 3-4 times a day, or as told by your doctor. You can do this in the bathroom while a hot shower is running.  Try not to spend time in cool or  dry air. Rest  Rest as much as you can.  Sleep with your head raised (elevated).  Make sure you get enough sleep each night. General instructions   Put a warm, moist washcloth on your face 3-4 times a day, or as often as told by your doctor. This will help with discomfort.  Wash your hands often with soap and water. If there is no soap and water, use hand sanitizer.  Do not smoke. Avoid being around people who are smoking (secondhand smoke).  Keep all follow-up visits as told by your doctor. This is important. Contact a doctor if:   You have a fever.  Your symptoms get worse.  Your symptoms do not get better within 10 days. Get help right away if:  You have a very bad headache.  You cannot stop throwing up (vomiting).  You have very bad pain or swelling around your face or eyes.  You have trouble seeing.  You feel confused.  Your neck is stiff.  You have trouble breathing. Summary  Sinusitis is swelling of your sinuses. Sinuses are hollow spaces in the bones around your face.  This condition is caused by tissues in your nose that become inflamed or swollen. This traps germs. These can lead to infection.  If you were prescribed an antibiotic medicine, take it as told by your doctor. Do not stop taking it even if you start to feel better.  Keep all follow-up visits as told by your doctor. This is important. This information is not intended to replace advice given to you by your health care provider. Make sure you discuss any questions you have with your health care provider. Document Released: 05/16/2008 Document Revised: 04/30/2018 Document Reviewed: 04/30/2018 Elsevier Patient Education  [place].   Fluconazole tablets- only take if vaginal yeast infection induced by antibiotics given history of this.  What is this medicine? FLUCONAZOLE (floo KON na zole) is an antifungal medicine. It is used to treat certain kinds of fungal or yeast infections. This medicine may be used for other purposes; ask your health care provider or pharmacist if you have questions. COMMON BRAND NAME(S): Diflucan What should I tell my health care provider before I take this medicine? They need to know if you have any of these conditions:  history of irregular heart beat  kidney disease  an unusual or allergic reaction to fluconazole, other azole antifungals, medicines, foods, dyes, or preservatives  pregnant or trying to get pregnant  breast-feeding How should I use this medicine? Take this medicine by  mouth. Follow the directions on the prescription label. Do not take your medicine more often than directed. Talk to your pediatrician regarding the use of this medicine in children. Special care may be needed. This medicine has been used in children as young as 31 months of age. Overdosage: If you think you have taken too much of this medicine contact a poison control center or emergency room at once. NOTE: This medicine is only for you. Do not share this medicine with others. What if I miss a dose? If you miss a dose, take it as soon as you can. If it is almost time for your next dose, take only that dose. Do not take double or extra doses. What may interact with this medicine? Do not take this medicine with any of the following medications:  astemizole  certain medicines for irregular heart beat like dronedarone, quinidine  cisapride  erythromycin  lomitapide  other medicines that prolong the  QT interval (cause an abnormal heart rhythm)  pimozide  terfenadine  thioridazine  tolvaptan This medicine may also interact with the following medications:  antiviral medicines for HIV or AIDS  birth control pills  certain antibiotics like rifabutin, rifampin  certain medicines for blood pressure like amlodipine, isradipine, felodipine, hydrochlorothiazide, losartan, nifedipine  certain medicines for cancer like cyclophosphamide, vinblastine, vincristine  certain medicines for cholesterol like atorvastatin, lovastatin, fluvastatin, simvastatin  certain medicines for depression, anxiety, or psychotic disturbances like amitriptyline, midazolam, nortriptyline, triazolam  certain medicines for diabetes like glipizide, glyburide, tolbutamide  certain medicines for pain like alfentanil, fentanyl, methadone  certain medicines for seizures like carbamazepine, phenytoin  certain medicines that treat or prevent blood clots like warfarin  dofetilide  halofantrine  medicines that  lower your chance of fighting infection like cyclosporine, prednisone, tacrolimus  NSAIDS, medicines for pain and inflammation, like celecoxib, diclofenac, flurbiprofen, ibuprofen, meloxicam, naproxen  other medicines for fungal infections  sirolimus  theophylline  tofacitinib  ziprasidone This list may not describe all possible interactions. Give your health care provider a list of all the medicines, herbs, non-prescription drugs, or dietary supplements you use. Also tell them if you smoke, drink alcohol, or use illegal drugs. Some items may interact with your medicine. What should I watch for while using this medicine? Visit your doctor or health care professional for regular checkups. If you are taking this medicine for a long time you may need blood work. Tell your doctor if your symptoms do not improve. Some fungal infections need many weeks or months of treatment to cure. Alcohol can increase possible damage to your liver. Avoid alcoholic drinks. If you have a vaginal infection, do not have sex until you have finished your treatment. You can wear a sanitary napkin. Do not use tampons. Wear freshly washed cotton, not synthetic, panties. What side effects may I notice from receiving this medicine? Side effects that you should report to your doctor or health care professional as soon as possible:  allergic reactions like skin rash or itching, hives, swelling of the lips, mouth, tongue, or throat  dark urine  feeling dizzy or faint  irregular heartbeat or chest pain  redness, blistering, peeling or loosening of the skin, including inside the mouth  trouble breathing  unusual bruising or bleeding  vomiting  yellowing of the eyes or skin Side effects that usually do not require medical attention (report to your doctor or health care professional if they continue or are bothersome):  changes in how food tastes  diarrhea  headache  stomach upset or nausea This list may  not describe all possible side effects. Call your doctor for medical advice about side effects. You may report side effects to FDA at 1-800-FDA-1088. Where should I keep my medicine? Keep out of the reach of children. Store at room temperature below 30 degrees C (86 degrees F). Throw away any medicine after the expiration date. NOTE: This sheet is a summary. It may not cover all possible information. If you have questions about this medicine, talk to your doctor, pharmacist, or health care provider.  2020 Elsevier/Gold Standard (2018-11-19 11:58:26) Levofloxacin tablets What is this medicine? LEVOFLOXACIN (lee voe FLOX a sin) is a quinolone antibiotic. It is used to treat certain kinds of bacterial infections. It will not work for colds, flu, or other viral infections. This medicine may be used for other purposes; ask your health care provider or pharmacist if you have questions. COMMON BRAND NAME(S): Levaquin, Levaquin Leva-Pak What should  I tell my health care provider before I take this medicine? They need to know if you have any of these conditions:  bone problems  diabetes  heart disease  high blood pressure  history of irregular heartbeat  history of low levels of potassium in the blood  joint problems  kidney disease  liver disease  mental illness  myasthenia gravis  seizures  tendon problems  tingling of the fingers or toes, or other nerve disorder  an unusual or allergic reaction to levofloxacin, other quinolone antibiotics, foods, dyes, or preservatives  pregnant or trying to get pregnant  breast-feeding How should I use this medicine? Take this medicine by mouth with a full glass of water. Follow the directions on the prescription label. You can take it with or without food. If it upsets your stomach, take it with food. Take your medicine at regular intervals. Do not take your medicine more often than directed. Take all of your medicine as directed even if  you think you are better. Do not skip doses or stop your medicine early. Avoid antacids, calcium, iron, and zinc products for 2 hours before and 2 hours after taking a dose of this medicine. A special MedGuide will be given to you by the pharmacist with each prescription and refill. Be sure to read this information carefully each time. Talk to your pediatrician regarding the use of this medicine in children. While this drug may be prescribed for children as young as 6 months for selected conditions, precautions do apply. Overdosage: If you think you have taken too much of this medicine contact a poison control center or emergency room at once. NOTE: This medicine is only for you. Do not share this medicine with others. What if I miss a dose? If you miss a dose, take it as soon as you remember. If it is almost time for your next dose, take only that dose. Do not take double or extra doses. What may interact with this medicine? Do not take this medicine with any of the following medications:  cisapride  dronedarone  pimozide  thioridazine This medicine may also interact with the following medications:  antacids  birth control pills  certain medicines for diabetes, like glipizide, glyburide, or insulin  certain medicines that treat or prevent blood clots like warfarin  didanosine buffered tablets or powder  multivitamins  NSAIDS, medicines for pain and inflammation, like ibuprofen or naproxen  other medicines that prolong the QT interval (cause an abnormal heart rhythm) like dofetilide, ziprasidone  steroid medicines like prednisone or cortisone  sucralfate  theophylline This list may not describe all possible interactions. Give your health care provider a list of all the medicines, herbs, non-prescription drugs, or dietary supplements you use. Also tell them if you smoke, drink alcohol, or use illegal drugs. Some items may interact with your medicine. What should I watch for  while using this medicine? Tell your doctor or healthcare professional if your symptoms do not start to get better or if they get worse. Do not treat diarrhea with over the counter products. Contact your doctor if you have diarrhea that lasts more than 2 days or if it is severe and watery. Check with your doctor or health care professional if you get an attack of severe diarrhea, nausea and vomiting, or if you sweat a lot. The loss of too much body fluid can make it dangerous for you to take this medicine. This medicine may increase blood sugar. Ask your healthcare provider if  changes in diet or medicines are needed if you have diabetes. You may get drowsy or dizzy. Do not drive, use machinery, or do anything that needs mental alertness until you know how this medicine affects you. Do not sit or stand up quickly, especially if you are an older patient. This reduces the risk of dizzy or fainting spells. This medicine can make you more sensitive to the sun. Keep out of the sun. If you cannot avoid being in the sun, wear protective clothing and use sunscreen. Do not use sun lamps or tanning beds/booths. What side effects may I notice from receiving this medicine? Side effects that you should report to your doctor or health care professional as soon as possible:  allergic reactions like skin rash or hives, swelling of the face, lips, or tongue  anxious  bloody or watery diarrhea  breathing problems  confusion  depressed mood  fast, irregular heartbeat  fever  hallucination, loss of contact with reality  joint, muscle, or tendon pain or swelling  loss of memory  muscle weakness  pain, tingling, numbness in the hands or feet  seizures  signs and symptoms of aortic dissection such as sudden chest, stomach, or back pain  signs and symptoms of high blood sugar such as being more thirsty or hungry or having to urinate more than normal. You may also feel very tired or have blurry vision.   signs and symptoms of liver injury like dark yellow or brown urine; general ill feeling or flu-like symptoms; light-colored stools; loss of appetite; nausea; right upper belly pain; unusually weak or tired; yellowing of the eyes or skin  signs and symptoms of low blood sugar such as feeling anxious; confusion; dizziness; increased hunger; unusually weak or tired; sweating; shakiness; cold; irritable; headache; blurred vision; fast heartbeat; loss of consciousness; pale skin  suicidal thoughts or other mood changes  sunburn  unusually weak Side effects that usually do not require medical attention (report to your doctor or health care professional if they continue or are bothersome):  constipation  dry mouth  headache  nausea, vomiting  trouble sleeping This list may not describe all possible side effects. Call your doctor for medical advice about side effects. You may report side effects to FDA at 1-800-FDA-1088. Where should I keep my medicine? Keep out of the reach of children. Store at room temperature between 15 and 30 degrees C (59 and 86 degrees F). Keep in a tightly closed container. Throw away any unused medicine after the expiration date. NOTE: This sheet is a summary. It may not cover all possible information. If you have questions about this medicine, talk to your doctor, pharmacist, or health care provider.  2020 Elsevier/Gold Standard (2018-11-19 14:03:14)  Diphenhydramine capsules or tablets What is this medicine? DIPHENHYDRAMINE (dye fen HYE dra meen) is an antihistamine. It is used to treat the symptoms of an allergic reaction. It is also used to treat Parkinson's disease. This medicine is also used to prevent and to treat motion sickness and as a nighttime sleep aid. This medicine may be used for other purposes; ask your health care provider or pharmacist if you have questions. COMMON BRAND NAME(S): Alka-Seltzer Plus Allergy, Aller-G-Time, Banophen, Benadryl  Allergy, Benadryl Allergy Dye Free, Benadryl Allergy Kapgel, Benadryl Allergy Ultratab, Diphedryl, Diphenhist, Genahist, Geri-Dryl, PHARBEDRYL, Q-Dryl, Gretta Began, Valu-Dryl, Vicks ZzzQuil Nightime Sleep-Aid What should I tell my health care provider before I take this medicine? They need to know if you have any of these conditions:  asthma  or lung disease  glaucoma  high blood pressure or heart disease  liver disease  pain or difficulty passing urine  prostate trouble  ulcers or other stomach problems  an unusual or allergic reaction to diphenhydramine, other medicines foods, dyes, or preservatives such as sulfites  pregnant or trying to get pregnant  breast-feeding How should I use this medicine? Take this medicine by mouth with a full glass of water. Follow the directions on the prescription label. Take your doses at regular intervals. Do not take your medicine more often than directed. To prevent motion sickness start taking this medicine 30 to 60 minutes before you leave. Talk to your pediatrician regarding the use of this medicine in children. Special care may be needed. Patients over 33 years old may have a stronger reaction and need a smaller dose. Overdosage: If you think you have taken too much of this medicine contact a poison control center or emergency room at once. NOTE: This medicine is only for you. Do not share this medicine with others. What if I miss a dose? If you miss a dose, take it as soon as you can. If it is almost time for your next dose, take only that dose. Do not take double or extra doses. What may interact with this medicine? Do not take this medicine with any of the following medications:  MAOIs like Carbex, Eldepryl, Marplan, Nardil, and Parnate This medicine may also interact with the following medications:  alcohol  barbiturates, like phenobarbital  medicines for bladder spasm like oxybutynin, tolterodine  medicines for blood pressure   medicines for depression, anxiety, or psychotic disturbances  medicines for movement abnormalities or Parkinson's disease  medicines for sleep  other medicines for cold, cough or allergy  some medicines for the stomach like chlordiazepoxide, dicyclomine This list may not describe all possible interactions. Give your health care provider a list of all the medicines, herbs, non-prescription drugs, or dietary supplements you use. Also tell them if you smoke, drink alcohol, or use illegal drugs. Some items may interact with your medicine. What should I watch for while using this medicine? Visit your doctor or health care professional for regular check ups. Tell your doctor if your symptoms do not improve or if they get worse. Your mouth may get dry. Chewing sugarless gum or sucking hard candy, and drinking plenty of water may help. Contact your doctor if the problem does not go away or is severe. This medicine may cause dry eyes and blurred vision. If you wear contact lenses you may feel some discomfort. Lubricating drops may help. See your eye doctor if the problem does not go away or is severe. You may get drowsy or dizzy. Do not drive, use machinery, or do anything that needs mental alertness until you know how this medicine affects you. Do not stand or sit up quickly, especially if you are an older patient. This reduces the risk of dizzy or fainting spells. Alcohol may interfere with the effect of this medicine. Avoid alcoholic drinks. What side effects may I notice from receiving this medicine? Side effects that you should report to your doctor or health care professional as soon as possible:  allergic reactions like skin rash, itching or hives, swelling of the face, lips, or tongue  changes in vision  confused, agitated, nervous  irregular or fast heartbeat  tremor  trouble passing urine  unusual bleeding or bruising  unusually weak or tired Side effects that usually do not require  medical attention (  report to your doctor or health care professional if they continue or are bothersome):  constipation, diarrhea  drowsy  headache  loss of appetite  stomach upset, vomiting  thick mucous This list may not describe all possible side effects. Call your doctor for medical advice about side effects. You may report side effects to FDA at 1-800-FDA-1088. Where should I keep my medicine? Keep out of the reach of children. Store at room temperature between 15 and 30 degrees C (59 and 86 degrees F). Keep container closed tightly. Throw away any unused medicine after the expiration date. NOTE: This sheet is a summary. It may not cover all possible information. If you have questions about this medicine, talk to your doctor, pharmacist, or health care provider.  2020 Elsevier/Gold Standard (2008-03-17 17:06:22)

## 2019-08-15 LAB — NOVEL CORONAVIRUS, NAA: SARS-CoV-2, NAA: NOT DETECTED

## 2019-08-16 ENCOUNTER — Encounter: Payer: Self-pay | Admitting: Adult Health

## 2019-08-21 DIAGNOSIS — D239 Other benign neoplasm of skin, unspecified: Secondary | ICD-10-CM

## 2019-08-21 HISTORY — DX: Other benign neoplasm of skin, unspecified: D23.9

## 2019-08-22 ENCOUNTER — Encounter: Payer: Managed Care, Other (non HMO) | Admitting: Family Medicine

## 2019-08-22 NOTE — Progress Notes (Signed)
Patient: [name] Female    DOB: 10-23-1976   43 y.o.   MRN: [id number] Visit Date: 08/22/2019  Today's Provider: Lavon Paganini, MD    Patient not available on video platform at time of visit and unable to be reached by phone. No show.    , Dionne Bucy, MD, MPH [facility]

## 2019-08-29 ENCOUNTER — Telehealth (INDEPENDENT_AMBULATORY_CARE_PROVIDER_SITE_OTHER): Payer: Managed Care, Other (non HMO) | Admitting: Family Medicine

## 2019-08-29 DIAGNOSIS — F5104 Psychophysiologic insomnia: Secondary | ICD-10-CM | POA: Diagnosis not present

## 2019-08-29 DIAGNOSIS — F331 Major depressive disorder, recurrent, moderate: Secondary | ICD-10-CM | POA: Diagnosis not present

## 2019-08-29 DIAGNOSIS — F902 Attention-deficit hyperactivity disorder, combined type: Secondary | ICD-10-CM

## 2019-08-29 DIAGNOSIS — F411 Generalized anxiety disorder: Secondary | ICD-10-CM | POA: Diagnosis not present

## 2019-08-29 MED ORDER — AMPHETAMINE-DEXTROAMPHETAMINE 15 MG PO TABS: 15.0000 mg | ORAL_TABLET | Freq: Two times a day (BID) | ORAL | 0 refills | Status: DC

## 2019-08-29 MED ORDER — BUPROPION HCL 75 MG PO TABS: 75.0000 mg | ORAL_TABLET | Freq: Two times a day (BID) | ORAL | 1 refills | Status: DC

## 2019-08-29 NOTE — Assessment & Plan Note (Signed)
Well controlled Related to PTSD Continue Trazodone

## 2019-08-29 NOTE — Assessment & Plan Note (Signed)
Well controlled Continue wellbutrin at current dose Repeat PHQ9 and GAD7 at next visit 

## 2019-08-29 NOTE — Progress Notes (Signed)
Patient: [name] Female    DOB: 1976/11/19   43 y.o.   MRN: [id number] Visit Date: 08/29/2019  Today's Provider: Lavon Paganini, MD   Chief Complaint  Patient presents with  . Anxiety  . Depression  . ADD   Subjective:    I, [name] CMA, am acting as a scribe for Lavon Paganini, MD.    Virtual Visit via Video Note  I connected with Kashae Swonger Woodell on 08/29/19 at  9:00 AM EDT by a video enabled telemedicine application and verified that I am speaking with the correct person using two identifiers.   Patient location: home Provider location: [place] involved in the visit: patient, provider    I discussed the limitations of evaluation and management by telemedicine and the availability of in person appointments. The patient expressed understanding and agreed to proceed.  Patient was unable to connect through my chart, so we use [email or web address] instead  HPI  Anxiety & Depression  Patient presents today via video visit for follow-up. Patient was last seen virtually on 05/16/2019. Patient reports good compliance with treatment and denies any side effects at the moment.  Reports symptoms are well controlled.  She is starting school next week.  GAD 7 : Generalized Anxiety Score 08/22/2019 05/16/2019 04/12/2019 03/14/2019  Nervous, Anxious, on Edge 1 2 1 2   Control/stop worrying 1 1 0 2  Worry too much - different things 1 1 0 3  Trouble relaxing 1 1 2 3   Restless 1 1 2 3   Easily annoyed or irritable 2 3 3 3   Afraid - awful might happen 0 1 0 0  Total GAD 7 Score 7 10 8 16   Anxiety Difficulty Somewhat difficult Somewhat difficult Somewhat difficult Somewhat difficult    Depression screen [facility] 2/9 08/22/2019 05/16/2019 04/12/2019 03/14/2019 12/18/2018  Decreased Interest 1 1 2 2  0  Down, Depressed, Hopeless 1 2 2 2  0  PHQ - 2 Score 2 3 4 4  0  Altered sleeping 1 1 2 1  -  Tired, decreased energy 1 0 2 3 -  Change in appetite 1 0 2 3 -  Feeling bad or  failure about yourself  0 0 2 2 -  Trouble concentrating 2 0 1 2 -  Moving slowly or fidgety/restless 1 0 0 2 -  Suicidal thoughts 0 0 0 0 -  PHQ-9 Score 8 4 13 17  -  Difficult doing work/chores Somewhat difficult Somewhat difficult Somewhat difficult Somewhat difficult -    ADD  The patient was last seen for this83monthsago. Changes made at last visit includescontinuing Adderall.  Shereports goodcompliance with treatment. Shefeels that condition isstable. Sheis nothaving side effects.    Allergies  Allergen Reactions  . Penicillins Swelling    Pt states caused throat to swell   . Cefdinir Palpitations  . Sulfa Antibiotics Other (See Comments)    Lips tingled     Current Outpatient Medications:  .  albuterol (PROVENTIL) (2.5 MG/3ML) 0.083% nebulizer solution, Take 3 mLs (2.5 mg total) by nebulization every 6 (six) hours as needed for wheezing or shortness of breath., Disp: 150 mL, Rfl: 1 .  albuterol (VENTOLIN HFA) 108 (90 Base) MCG/ACT inhaler, INHALE 2 PUFFS INTO THE LUNGS EVERY 6 HOURS AS NEEDED FOR WHEEZING OR SHORTNESS OF BREATH, Disp: 8.5 g, Rfl: 2 .  amphetamine-dextroamphetamine (ADDERALL) 15 MG tablet, Take 1 tablet by mouth 2 (two) times daily., Disp: 60 tablet, Rfl: 0 .  amphetamine-dextroamphetamine (ADDERALL) 15  MG tablet, Take 1 tablet by mouth 2 (two) times daily., Disp: 60 tablet, Rfl: 0 .  amphetamine-dextroamphetamine (ADDERALL) 15 MG tablet, Take 1 tablet by mouth 2 (two) times daily., Disp: 60 tablet, Rfl: 0 .  buPROPion (WELLBUTRIN) 75 MG tablet, Take 1 tablet (75 mg total) by mouth 2 (two) times daily., Disp: 60 tablet, Rfl: 2 .  ciprofloxacin (CIPRO) 500 MG tablet, Take 1 tablet (500 mg total) by mouth 2 (two) times daily. (Patient not taking: Reported on 08/22/2019), Disp: 14 tablet, Rfl: 0 .  estradiol (ESTRACE) 1 MG tablet, TK 1 T PO WITH ACTIVE ORAL CONTRACEPTIVE PILLS, Disp: , Rfl:  .  fluticasone furoate-vilanterol (BREO ELLIPTA) 100-25  MCG/INH AEPB, INL 1 PUFF ITL D, Disp: , Rfl:  .  levofloxacin (LEVAQUIN) 500 MG tablet, Take 1 tablet (500 mg total) by mouth daily., Disp: 10 tablet, Rfl: 0 .  liothyronine (CYTOMEL) 5 MCG tablet, Take 25 mcg by mouth daily. , Disp: , Rfl:  .  meloxicam (MOBIC) 15 MG tablet, TAKE 1 TABLET BY MOUTH EVERY DAY, Disp: 90 tablet, Rfl: 3 .  MICROGESTIN FE 1/20 1-20 MG-MCG tablet, , Disp: , Rfl:  .  montelukast (SINGULAIR) 10 MG tablet, TAKE 1 TABLET(10 MG) BY MOUTH AT BEDTIME, Disp: 30 tablet, Rfl: 11 .  naratriptan (AMERGE) 2.5 MG tablet, Take 2.5 mg by mouth as needed for migraine. Take one (1) tablet at onset of headache; if returns or does not resolve, may repeat after 4 hours; do not exceed five (5) mg in 24 hours., Disp: , Rfl:  .  SUMAtriptan (IMITREX) 100 MG tablet, TK 1 T PO AOS OF HEADACHE. REPEAT IN 2 H AS NEEDED. MAX DOSE 2 T IN 24 H, Disp: , Rfl:  .  traZODone (DESYREL) 150 MG tablet, TAKE 1/2 TABLET(75 MG) BY MOUTH AT BEDTIME, Disp: 45 tablet, Rfl: 11  Review of Systems  Constitutional: Negative.   Respiratory: Negative.   Genitourinary: Negative.   Hematological: Negative.     Social History   Tobacco Use  . Smoking status: Never Smoker  . Smokeless tobacco: Never Used  Substance Use Topics  . Alcohol use: Yes    Alcohol/week: 0.0 standard drinks    Comment: rare- wine       Objective:   There were no vitals taken for this visit. There were no vitals filed for this visit.There is no height or weight on file to calculate BMI.   Physical Exam Constitutional:      General: She is not in acute distress.    Appearance: Normal appearance.  HENT:     Head: Normocephalic and atraumatic.  Eyes:     General: No scleral icterus.    Conjunctiva/sclera: Conjunctivae normal.  Pulmonary:     Effort: Pulmonary effort is normal. No respiratory distress.  Neurological:     Mental Status: She is alert and oriented to person, place, and time. Mental status is at baseline.   Psychiatric:        Mood and Affect: Mood normal.        Behavior: Behavior normal.    No results found for any visits on 08/29/19.     Assessment & Plan    I discussed the assessment and treatment plan with the patient. The patient was provided an opportunity to ask questions and all were answered. The patient agreed with the plan and demonstrated an understanding of the instructions.   The patient was advised to call back or seek an in-person evaluation  if the symptoms worsen or if the condition fails to improve as anticipated.  Problem List Items Addressed This Visit      Other   ADD (attention deficit disorder)    Stable and well controlled No medication side effects Continue adderall F/u in 3 months      Insomnia    Well controlled Related to PTSD Continue Trazodone      GAD (generalized anxiety disorder)    Well controlled Continue wellbutrin at current dose Have discussed SSRIs, but patient wary of potential side effects      Moderate episode of recurrent major depressive disorder ([place])    Well controlled Continue wellbutrin at current dose Repeat PHQ9 and GAD7 at next visit          Return in about 3 months (around 11/28/2019) for CPE.   The entirety of the information documented in the History of Present Illness, Review of Systems and Physical Exam were personally obtained by me. Portions of this information were initially documented by [facility], CMA and reviewed by me for thoroughness and accuracy.    , Dionne Bucy, MD MPH [facility]

## 2019-08-29 NOTE — Assessment & Plan Note (Signed)
Well controlled Continue wellbutrin at current dose Have discussed SSRIs, but patient wary of potential side effects 

## 2019-08-29 NOTE — Assessment & Plan Note (Signed)
Stable and well controlled No medication side effects Continue adderall F/u in 3 months

## 2019-09-24 ENCOUNTER — Encounter: Payer: Self-pay | Admitting: Family Medicine

## 2019-09-24 DIAGNOSIS — J3489 Other specified disorders of nose and nasal sinuses: Secondary | ICD-10-CM | POA: Insufficient documentation

## 2019-09-24 DIAGNOSIS — J342 Deviated nasal septum: Secondary | ICD-10-CM | POA: Insufficient documentation

## 2019-09-24 DIAGNOSIS — J310 Chronic rhinitis: Secondary | ICD-10-CM | POA: Insufficient documentation

## 2019-09-24 MED ORDER — BUPROPION HCL 100 MG PO TABS: 100.0000 mg | ORAL_TABLET | Freq: Two times a day (BID) | ORAL | 2 refills | Status: DC

## 2019-10-09 ENCOUNTER — Other Ambulatory Visit: Payer: Self-pay | Admitting: *Deleted

## 2019-10-09 DIAGNOSIS — Z20822 Contact with and (suspected) exposure to covid-19: Secondary | ICD-10-CM

## 2019-10-10 LAB — NOVEL CORONAVIRUS, NAA: SARS-CoV-2, NAA: NOT DETECTED

## 2019-10-11 ENCOUNTER — Telehealth: Payer: Self-pay | Admitting: Family Medicine

## 2019-10-11 ENCOUNTER — Encounter: Payer: Self-pay | Admitting: Family Medicine

## 2019-10-11 NOTE — Telephone Encounter (Signed)
Letter sent to patient through my chart.  It clears her to go back to work on Monday if this will be 10 days from the start of her illness

## 2019-10-11 NOTE — Telephone Encounter (Signed)
Patient advised as below. Patient is requesting a note to stay out of work. Patient reports she has been out since Tuesday 10/08/2019.

## 2019-10-11 NOTE — Telephone Encounter (Signed)
Since last Thurs. She has had a fever at night while sleeping, wakes up in the morning with drenched sweats.  Things taste funny, extreme fatigue and sore throat.  She had a COVID test that came back negative.  Just needing some advise.  Please call pt back at [phone].  Thanks, [employer]

## 2019-10-11 NOTE — Telephone Encounter (Signed)
Symptoms do seem consistent with possible Covid infection or other viral infection. Despite negative COVID test recommend self-isolation for at least 10 days from start of illness and until fever free for 2 days.

## 2019-10-31 ENCOUNTER — Encounter: Payer: Self-pay | Admitting: Family Medicine

## 2019-10-31 ENCOUNTER — Telehealth (INDEPENDENT_AMBULATORY_CARE_PROVIDER_SITE_OTHER): Payer: Managed Care, Other (non HMO) | Admitting: Family Medicine

## 2019-10-31 VITALS — Temp 97.8°F

## 2019-10-31 DIAGNOSIS — J0101 Acute recurrent maxillary sinusitis: Secondary | ICD-10-CM | POA: Diagnosis not present

## 2019-10-31 MED ORDER — FLUCONAZOLE 150 MG PO TABS: 150.0000 mg | ORAL_TABLET | Freq: Once | ORAL | 0 refills | Status: AC

## 2019-10-31 MED ORDER — DOXYCYCLINE HYCLATE 100 MG PO TABS: 100.0000 mg | ORAL_TABLET | Freq: Two times a day (BID) | ORAL | 0 refills | Status: DC

## 2019-10-31 NOTE — Progress Notes (Signed)
Patient: [name] Female    DOB: 1976/02/17   43 y.o.   MRN: [id number] Visit Date: 10/31/2019  Today's Provider: Lavon Paganini, MD   Chief Complaint  Patient presents with  . URI   Subjective:    Virtual Visit via Video Note  I connected with [name] on 10/31/19 at  8:40 AM EST by a video enabled telemedicine application and verified that I am speaking with the correct person using two identifiers.   Patient location: home Provider location: [place] involved in the visit: patient, provider   I discussed the limitations of evaluation and management by telemedicine and the availability of in person appointments. The patient expressed understanding and agreed to proceed.  Interactive audio and video communications were attempted, although failed due to patient's inability to connect to video. Continued visit with audio only interaction with patient agreement.    HPI Sinus congestion, purulent nasal drainage, headaches, sinus pain Symptoms present for ~2 wks Taking sudafed with mild relief Saw Dr Carlis Abbott (ENT) and planning surgery in 2021 for nasal collapse and recurrent sinusitis No fever/chills, cough, SOB, chest pain, eye pain/redness, visual disturbance Not sure what makes it worse    Allergies  Allergen Reactions  . Penicillins Swelling    Pt states caused throat to swell   . Cefdinir Palpitations  . Sulfa Antibiotics Other (See Comments)    Lips tingled     Current Outpatient Medications:  .  liothyronine (CYTOMEL) 5 MCG tablet, Take 25 mcg by mouth daily. , Disp: , Rfl:  .  meloxicam (MOBIC) 15 MG tablet, TAKE 1 TABLET BY MOUTH EVERY DAY, Disp: 90 tablet, Rfl: 3 .  MICROGESTIN FE 1/20 1-20 MG-MCG tablet, , Disp: , Rfl:  .  montelukast (SINGULAIR) 10 MG tablet, TAKE 1 TABLET(10 MG) BY MOUTH AT BEDTIME, Disp: 30 tablet, Rfl: 11 .  naratriptan (AMERGE) 2.5 MG tablet, Take 2.5 mg by mouth as needed for migraine.  Take one (1) tablet at onset of headache; if returns or does not resolve, may repeat after 4 hours; do not exceed five (5) mg in 24 hours., Disp: , Rfl:  .  SUMAtriptan (IMITREX) 100 MG tablet, TK 1 T PO AOS OF HEADACHE. REPEAT IN 2 H AS NEEDED. MAX DOSE 2 T IN 24 H, Disp: , Rfl:  .  traZODone (DESYREL) 150 MG tablet, TAKE 1/2 TABLET(75 MG) BY MOUTH AT BEDTIME, Disp: 45 tablet, Rfl: 11 .  albuterol (PROVENTIL) (2.5 MG/3ML) 0.083% nebulizer solution, Take 3 mLs (2.5 mg total) by nebulization every 6 (six) hours as needed for wheezing or shortness of breath., Disp: 150 mL, Rfl: 1 .  albuterol (VENTOLIN HFA) 108 (90 Base) MCG/ACT inhaler, INHALE 2 PUFFS INTO THE LUNGS EVERY 6 HOURS AS NEEDED FOR WHEEZING OR SHORTNESS OF BREATH, Disp: 8.5 g, Rfl: 2 .  amphetamine-dextroamphetamine (ADDERALL) 15 MG tablet, Take 1 tablet by mouth 2 (two) times daily., Disp: 60 tablet, Rfl: 0 .  amphetamine-dextroamphetamine (ADDERALL) 15 MG tablet, Take 1 tablet by mouth 2 (two) times daily., Disp: 60 tablet, Rfl: 0 .  amphetamine-dextroamphetamine (ADDERALL) 15 MG tablet, Take 1 tablet by mouth 2 (two) times daily., Disp: 60 tablet, Rfl: 0 .  buPROPion (WELLBUTRIN) 100 MG tablet, Take 1 tablet (100 mg total) by mouth 2 (two) times daily., Disp: 60 tablet, Rfl: 2 .  doxycycline (VIBRA-TABS) 100 MG tablet, Take 1 tablet (100 mg total) by mouth 2 (two) times daily., Disp: 20  tablet, Rfl: 0 .  estradiol (ESTRACE) 1 MG tablet, TK 1 T PO WITH ACTIVE ORAL CONTRACEPTIVE PILLS, Disp: , Rfl:  .  fluconazole (DIFLUCAN) 150 MG tablet, Take 1 tablet (150 mg total) by mouth once for 1 dose. May repeat in 3 days if symptoms not resolved, Disp: 2 tablet, Rfl: 0 .  fluticasone furoate-vilanterol (BREO ELLIPTA) 100-25 MCG/INH AEPB, INL 1 PUFF ITL D, Disp: , Rfl:   Review of Systems  Constitutional: Negative.   HENT: Positive for congestion, facial swelling, postnasal drip, rhinorrhea, sinus pressure and sinus pain. Negative for ear  discharge, ear pain, hearing loss, mouth sores, sore throat, trouble swallowing and voice change.   Eyes: Negative.   Respiratory: Negative.   Cardiovascular: Negative.   Neurological: Negative.   Psychiatric/Behavioral: Negative.     Social History   Tobacco Use  . Smoking status: Never Smoker  . Smokeless tobacco: Never Used  Substance Use Topics  . Alcohol use: Yes    Alcohol/week: 0.0 standard drinks    Comment: rare- wine       Objective:   Temp 97.8 F (36.6 C) (Temporal)  Vitals:   10/31/19 0828  Temp: 97.8 F (36.6 C)  TempSrc: Temporal  There is no height or weight on file to calculate BMI.   Physical Exam Speaking in full sentences in NAD   CT Sinus from 07/30/2019 reviewed: FINDINGS:  Sequela of left frontal sinusotomy, bilateral ethmoidectomies, uncinectomies, antrostomies, and septoplasty. Frontal sinuses are clear. Frontal recesses are patent. Ethmoid air cells are clear. Sphenoid sinuses are clear. Sphenoethmoidal recesses are patent. Mild mucosal thickening in the inferior left maxillary sinus. Right maxillary sinus is clear. Ostiomeatal units are patent.   Minimal nasal septal deviation to right. No skull base dehiscence.  IMPRESSION: Sequela of prior sinus surgery and mild left maxillary sinusitis as above.      Assessment & Plan      I discussed the assessment and treatment plan with the patient. The patient was provided an opportunity to ask questions and all were answered. The patient agreed with the plan and demonstrated an understanding of the instructions.   The patient was advised to call back or seek an in-person evaluation if the symptoms worsen or if the condition fails to improve as anticipated.  1. Acute recurrent maxillary sinusitis - symptoms c/w sinusitis   - no symptoms of AOM, CAP, strep pharyngitis, or other infection - given duration of symptoms, suspect bacterial etiology - will treat with doxycycline x10d - discussed  symptomatic management (flonase, decongestants, etc), natural course, and return precautions   -Continue to follow with ENT for ongoing management of chronic recurrent sinusitis    Meds ordered this encounter  Medications  . doxycycline (VIBRA-TABS) 100 MG tablet    Sig: Take 1 tablet (100 mg total) by mouth 2 (two) times daily.    Dispense:  20 tablet    Refill:  0  . fluconazole (DIFLUCAN) 150 MG tablet    Sig: Take 1 tablet (150 mg total) by mouth once for 1 dose. May repeat in 3 days if symptoms not resolved    Dispense:  2 tablet    Refill:  0     Return if symptoms worsen or fail to improve.   The entirety of the information documented in the History of Present Illness, Review of Systems and Physical Exam were personally obtained by me. Portions of this information were initially documented by Lynford Humphrey, CMA and reviewed by me for thoroughness  and accuracy.    , Dionne Bucy, MD MPH [facility]

## 2019-10-31 NOTE — Progress Notes (Signed)
Patient: LEEANNE Harper Female    DOB: 1976/07/26   43 y.o.   MRN: [id number] Visit Date: 10/31/2019  Today's Provider: Lavon Paganini, MD   Chief Complaint  Patient presents with  . URI   Subjective:     URI  This is a new problem. Episode onset: 10 days ago. The problem has been unchanged. There has been no fever. Associated symptoms include congestion, headaches, a plugged ear sensation (right ear) and sinus pain. Pertinent negatives include no abdominal pain, coughing, diarrhea, dysuria, ear pain, nausea, sneezing, sore throat, swollen glands, vomiting or wheezing. Chest pain: sinus congestion. Treatments tried: Advil, Tylenol and Sudafed. The treatment provided mild relief.    Allergies  Allergen Reactions  . Penicillins Swelling    Pt states caused throat to swell   . Cefdinir Palpitations  . Sulfa Antibiotics Other (See Comments)    Lips tingled     Current Outpatient Medications:  .  liothyronine (CYTOMEL) 5 MCG tablet, Take 25 mcg by mouth daily. , Disp: , Rfl:  .  meloxicam (MOBIC) 15 MG tablet, TAKE 1 TABLET BY MOUTH EVERY DAY, Disp: 90 tablet, Rfl: 3 .  MICROGESTIN FE 1/20 1-20 MG-MCG tablet, , Disp: , Rfl:  .  montelukast (SINGULAIR) 10 MG tablet, TAKE 1 TABLET(10 MG) BY MOUTH AT BEDTIME, Disp: 30 tablet, Rfl: 11 .  naratriptan (AMERGE) 2.5 MG tablet, Take 2.5 mg by mouth as needed for migraine. Take one (1) tablet at onset of headache; if returns or does not resolve, may repeat after 4 hours; do not exceed five (5) mg in 24 hours., Disp: , Rfl:  .  SUMAtriptan (IMITREX) 100 MG tablet, TK 1 T PO AOS OF HEADACHE. REPEAT IN 2 H AS NEEDED. MAX DOSE 2 T IN 24 H, Disp: , Rfl:  .  traZODone (DESYREL) 150 MG tablet, TAKE 1/2 TABLET(75 MG) BY MOUTH AT BEDTIME, Disp: 45 tablet, Rfl: 11 .  albuterol (PROVENTIL) (2.5 MG/3ML) 0.083% nebulizer solution, Take 3 mLs (2.5 mg total) by nebulization every 6 (six) hours as needed for wheezing or shortness of breath., Disp:  150 mL, Rfl: 1 .  albuterol (VENTOLIN HFA) 108 (90 Base) MCG/ACT inhaler, INHALE 2 PUFFS INTO THE LUNGS EVERY 6 HOURS AS NEEDED FOR WHEEZING OR SHORTNESS OF BREATH, Disp: 8.5 g, Rfl: 2 .  amphetamine-dextroamphetamine (ADDERALL) 15 MG tablet, Take 1 tablet by mouth 2 (two) times daily., Disp: 60 tablet, Rfl: 0 .  amphetamine-dextroamphetamine (ADDERALL) 15 MG tablet, Take 1 tablet by mouth 2 (two) times daily., Disp: 60 tablet, Rfl: 0 .  amphetamine-dextroamphetamine (ADDERALL) 15 MG tablet, Take 1 tablet by mouth 2 (two) times daily., Disp: 60 tablet, Rfl: 0 .  buPROPion (WELLBUTRIN) 100 MG tablet, Take 1 tablet (100 mg total) by mouth 2 (two) times daily., Disp: 60 tablet, Rfl: 2 .  estradiol (ESTRACE) 1 MG tablet, TK 1 T PO WITH ACTIVE ORAL CONTRACEPTIVE PILLS, Disp: , Rfl:  .  fluticasone furoate-vilanterol (BREO ELLIPTA) 100-25 MCG/INH AEPB, INL 1 PUFF ITL D, Disp: , Rfl:   Review of Systems  Constitutional: Negative for appetite change, chills, fatigue and fever.  HENT: Positive for congestion, postnasal drip, sinus pressure and sinus pain. Negative for ear pain, sneezing and sore throat.        Green nasal drainage  Eyes: Positive for pain (between eyes).  Respiratory: Negative for cough, chest tightness, shortness of breath and wheezing.   Cardiovascular: Negative for palpitations. Chest pain: sinus congestion.  Gastrointestinal: Negative for abdominal pain, diarrhea, nausea and vomiting.  Genitourinary: Negative for dysuria.  Neurological: Positive for headaches. Negative for dizziness and weakness.    Social History   Tobacco Use  . Smoking status: Never Smoker  . Smokeless tobacco: Never Used  Substance Use Topics  . Alcohol use: Yes    Alcohol/week: 0.0 standard drinks    Comment: rare- wine       Objective:   Temp 97.8 F (36.6 C) (Temporal)  Vitals:   10/31/19 0828  Temp: 97.8 F (36.6 C)  TempSrc: Temporal  There is no height or weight on file to calculate BMI.    Physical Exam   No results found for any visits on 10/31/19.     Assessment & Plan        Lavon Paganini, MD  [facility]

## 2019-10-31 NOTE — Patient Instructions (Signed)

## 2019-11-01 ENCOUNTER — Telehealth: Payer: Managed Care, Other (non HMO) | Admitting: Family Medicine

## 2019-11-01 ENCOUNTER — Ambulatory Visit: Payer: Managed Care, Other (non HMO) | Admitting: Family Medicine

## 2019-11-06 ENCOUNTER — Encounter: Payer: Self-pay | Admitting: Family Medicine

## 2019-11-06 DIAGNOSIS — E039 Hypothyroidism, unspecified: Secondary | ICD-10-CM

## 2019-11-14 ENCOUNTER — Other Ambulatory Visit: Payer: Self-pay

## 2019-11-14 ENCOUNTER — Encounter: Payer: Self-pay | Admitting: Family Medicine

## 2019-11-14 ENCOUNTER — Ambulatory Visit (INDEPENDENT_AMBULATORY_CARE_PROVIDER_SITE_OTHER): Payer: Managed Care, Other (non HMO) | Admitting: Family Medicine

## 2019-11-14 VITALS — BP 135/79 | HR 85 | Temp 97.3°F | Wt 147.0 lb

## 2019-11-14 DIAGNOSIS — E039 Hypothyroidism, unspecified: Secondary | ICD-10-CM

## 2019-11-14 DIAGNOSIS — F411 Generalized anxiety disorder: Secondary | ICD-10-CM

## 2019-11-14 DIAGNOSIS — Z1231 Encounter for screening mammogram for malignant neoplasm of breast: Secondary | ICD-10-CM | POA: Diagnosis not present

## 2019-11-14 DIAGNOSIS — F331 Major depressive disorder, recurrent, moderate: Secondary | ICD-10-CM | POA: Diagnosis not present

## 2019-11-14 DIAGNOSIS — Z0000 Encounter for general adult medical examination without abnormal findings: Secondary | ICD-10-CM

## 2019-11-14 DIAGNOSIS — Z114 Encounter for screening for human immunodeficiency virus [HIV]: Secondary | ICD-10-CM

## 2019-11-14 DIAGNOSIS — F902 Attention-deficit hyperactivity disorder, combined type: Secondary | ICD-10-CM

## 2019-11-14 MED ORDER — BUPROPION HCL 75 MG PO TABS: 150.0000 mg | ORAL_TABLET | Freq: Two times a day (BID) | ORAL | 3 refills | Status: DC

## 2019-11-14 NOTE — Assessment & Plan Note (Addendum)
Well controlled Increase Wellbutrin as below for better depression control Have discussed SSRIs, but patient wary of potential side effects

## 2019-11-14 NOTE — Assessment & Plan Note (Signed)
Was previously doing well, but recent worsening Increase Wellbutrin to 150 mg twice daily Discussed importance of therapy Follow-up in 3 months and repeat PHQ-9 and GAD-7

## 2019-11-14 NOTE — Patient Instructions (Signed)

## 2019-11-14 NOTE — Assessment & Plan Note (Signed)
Has been followed by [facility] in [place] previously but is awaiting appt with Endocrinology Recheck TSH and free T4

## 2019-11-14 NOTE — Progress Notes (Signed)
Patient: [name], Female    DOB: 1976-05-19, 43 y.o.   MRN: [id number] Visit Date: 11/14/2019  Today's Provider: Lavon Paganini, MD   Chief Complaint  Patient presents with  . Annual Exam   Subjective:     Annual physical exam [name] is a 43 y.o. female who presents today for health maintenance and complete physical. She feels fairly well. She reports not exercising. She reports she is sleeping poorly.  Pt reports having trouble falling asleep.    ----------------------------------------------------------------- Has been off of Adderall for 2 wks, because she felt like her benefit was down  She does this intermittently and then will resume after 6-12 months.  She feels like her focus is doing well.    Review of Systems  Constitutional: Positive for activity change, appetite change and fatigue. Negative for chills, diaphoresis, fever and unexpected weight change.  HENT: Positive for congestion and sinus pressure. Negative for dental problem, drooling, ear discharge, ear pain, facial swelling, hearing loss, mouth sores, nosebleeds, postnasal drip, rhinorrhea, sinus pain, sneezing, sore throat, tinnitus, trouble swallowing and voice change.   Eyes: Negative.   Respiratory: Negative.   Cardiovascular: Negative.   Gastrointestinal: Negative.   Endocrine: Negative.   Genitourinary: Negative.   Musculoskeletal: Negative.   Skin: Negative.   Allergic/Immunologic: Negative.   Neurological: Negative.   Hematological: Negative.   Psychiatric/Behavioral: Positive for decreased concentration and dysphoric mood. Negative for agitation, behavioral problems, confusion, hallucinations, self-injury, sleep disturbance and suicidal ideas. The patient is not nervous/anxious and is not hyperactive.     Social History      She  reports that she has never smoked. She has never used smokeless tobacco. She reports current alcohol use. She reports that she does not use drugs.  Social History   Socioeconomic History  . Marital status: Divorced    Spouse name: Not on file  . Number of children: 2  . Years of education: Not on file  . Highest education level: Not on file  Occupational History    Employer: [employer]  . Financial resource strain: Not on file  . Food insecurity    Worry: Not on file    Inability: Not on file  . Transportation needs    Medical: Not on file    Non-medical: Not on file  Tobacco Use  . Smoking status: Never Smoker  . Smokeless tobacco: Never Used  Substance and Sexual Activity  . Alcohol use: Yes    Alcohol/week: 0.0 standard drinks    Comment: rare- wine   . Drug use: Never  . Sexual activity: Yes    Birth control/protection: Pill  Lifestyle  . Physical activity    Days per week: Not on file    Minutes per session: Not on file  . Stress: Not on file  Relationships  . Social Herbalist on phone: Not on file    Gets together: Not on file    Attends religious service: Not on file    Active member of club or organization: Not on file    Attends meetings of clubs or organizations: Not on file    Relationship status: Not on file  Other Topics Concern  . Not on file  Social History Narrative  . Not on file    Past Medical History:  Diagnosis Date  . ADD (attention deficit disorder)   . Bone spur    "front of spine" -  effects right shoulder (sometimes)  . Deviated nasal septum   . Hypothyroidism   . Nondisplaced fracture of fifth left metatarsal bone   . PTSD (post-traumatic stress disorder)   . RA (rheumatoid arthritis) ([place]) 2016  . Rheumatoid arthritis ([place]) 12/09/2016  . Sinusitis      Patient Active Problem List   Diagnosis Date Noted  . GAD (generalized anxiety disorder) 03/15/2019  . Moderate episode of recurrent major depressive disorder ([place]) 03/15/2019  . Rheumatoid arthritis ([place]) 11/01/2018  . Primary osteoarthritis of both hands 05/11/2018  .  Primary osteoarthritis of both feet 05/11/2018  . Raynaud's disease without gangrene 05/11/2018  . Asthma 04/20/2018  . Onychomycosis 04/20/2018  . Family history of rheumatoid arthritis 04/10/2018  . Dry mouth 01/23/2018  . Radicular pain in right arm 10/16/2017  . PTSD (post-traumatic stress disorder) 10/16/2017  . Insomnia 10/16/2017  . Migraine with aura 10/16/2017  . Hypothyroidism   . ADD (attention deficit disorder)   . Uterine leiomyoma 09/22/2017  . 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 History of uterine fibroid 09/22/2017  . Polyarthralgia 12/09/2016    Past Surgical History:  Procedure Laterality Date  . CESAREAN SECTION    . DILATION AND CURETTAGE OF UTERUS     (x2)  . FRONTAL SINUS EXPLORATION Bilateral 11/19/2015   Procedure: FRONTAL SINUS EXPLORATION;  Surgeon: Margaretha Sheffield, MD;  Location: [place];  Service: ENT;  Laterality: Bilateral;  . IMAGE GUIDED SINUS SURGERY N/A 11/19/2015   Procedure: IMAGE GUIDED SINUS SURGERY;  Surgeon: Margaretha Sheffield, MD;  Location: [place];  Service: ENT;  Laterality: N/A;  GAVE DISK TO CE CE 09/22  . MAXILLARY ANTROSTOMY Bilateral 11/19/2015   Procedure: MAXILLARY ANTROSTOMY;  Surgeon: Margaretha Sheffield, MD;  Location: [place];  Service: ENT;  Laterality: Bilateral;  . SEPTOPLASTY N/A 11/19/2015   Procedure: SEPTOPLASTY;  Surgeon: Margaretha Sheffield, MD;  Location: [place];  Service: ENT;  Laterality: N/A;  . SPHENOIDECTOMY Bilateral 11/19/2015   Procedure: Coralee Pesa;  Surgeon: Margaretha Sheffield, MD;  Location: [place];  Service: ENT;  Laterality: Bilateral;  . TONSILLECTOMY      Family History        Family Status  Relation Name Status  . Mother  Alive  . PGF  (Not Specified)  . Cousin  (Not Specified)  . Father  Alive  . Brother  Alive  . Son  Alive  . Daughter  Alive       legally blind in left eye         Her family history includes Alcohol abuse in her brother; Cancer in her  cousin and paternal grandfather; Drug abuse in her brother; Rheum arthritis in her mother; Vascular Disease in her father.      Allergies  Allergen Reactions  . Penicillins Swelling    Pt states caused throat to swell   . Cefdinir Palpitations  . Sulfa Antibiotics Other (See Comments)    Lips tingled     Current Outpatient Medications:  .  albuterol (PROVENTIL) (2.5 MG/3ML) 0.083% nebulizer solution, Take 3 mLs (2.5 mg total) by nebulization every 6 (six) hours as needed for wheezing or shortness of breath., Disp: 150 mL, Rfl: 1 .  albuterol (VENTOLIN HFA) 108 (90 Base) MCG/ACT inhaler, INHALE 2 PUFFS INTO THE LUNGS EVERY 6 HOURS AS NEEDED FOR WHEEZING OR SHORTNESS OF BREATH, Disp: 8.5 g, Rfl: 2 .  B Complex Vitamins (VITAMIN B COMPLEX PO), Take by mouth., Disp: , Rfl:  .  Biotin 10 MG TABS, Take by mouth., Disp: , Rfl:  .  buPROPion (WELLBUTRIN) 75 MG tablet, Take 2 tablets (150 mg total) by mouth 2 (two) times daily., Disp: 120 tablet, Rfl: 3 .  estradiol (ESTRACE) 1 MG tablet, TK 1 T PO WITH ACTIVE ORAL CONTRACEPTIVE PILLS, Disp: , Rfl:  .  liothyronine (CYTOMEL) 5 MCG tablet, Take 25 mcg by mouth daily. , Disp: , Rfl:  .  meloxicam (MOBIC) 15 MG tablet, TAKE 1 TABLET BY MOUTH EVERY DAY, Disp: 90 tablet, Rfl: 3 .  MICROGESTIN FE 1/20 1-20 MG-MCG tablet, , Disp: , Rfl:  .  montelukast (SINGULAIR) 10 MG tablet, TAKE 1 TABLET(10 MG) BY MOUTH AT BEDTIME, Disp: 30 tablet, Rfl: 11 .  Multiple Vitamin (MULTIVITAMIN) tablet, Take 1 tablet by mouth daily., Disp: , Rfl:  .  Multiple Vitamins-Minerals (HAIR SKIN AND NAILS FORMULA PO), Take by mouth., Disp: , Rfl:  .  naratriptan (AMERGE) 2.5 MG tablet, Take 2.5 mg by mouth as needed for migraine. Take one (1) tablet at onset of headache; if returns or does not resolve, may repeat after 4 hours; do not exceed five (5) mg in 24 hours., Disp: , Rfl:  .  Omega-3 1000 MG CAPS, Take by mouth., Disp: , Rfl:  .  SUMAtriptan (IMITREX) 100 MG tablet, TK 1  T PO AOS OF HEADACHE. REPEAT IN 2 H AS NEEDED. MAX DOSE 2 T IN 24 H, Disp: , Rfl:  .  traZODone (DESYREL) 150 MG tablet, TAKE 1/2 TABLET(75 MG) BY MOUTH AT BEDTIME, Disp: 45 tablet, Rfl: 11 .  amphetamine-dextroamphetamine (ADDERALL) 15 MG tablet, Take 1 tablet by mouth 2 (two) times daily. (Patient not taking: Reported on 11/14/2019), Disp: 60 tablet, Rfl: 0 .  amphetamine-dextroamphetamine (ADDERALL) 15 MG tablet, Take 1 tablet by mouth 2 (two) times daily. (Patient not taking: Reported on 11/14/2019), Disp: 60 tablet, Rfl: 0 .  amphetamine-dextroamphetamine (ADDERALL) 15 MG tablet, Take 1 tablet by mouth 2 (two) times daily. (Patient not taking: Reported on 11/14/2019), Disp: 60 tablet, Rfl: 0 .  doxycycline (VIBRA-TABS) 100 MG tablet, Take 1 tablet (100 mg total) by mouth 2 (two) times daily., Disp: 20 tablet, Rfl: 0 .  fluticasone furoate-vilanterol (BREO ELLIPTA) 100-25 MCG/INH AEPB, INL 1 PUFF ITL D, Disp: , Rfl:    Patient Care Team: Virginia Crews, MD as PCP - General (Family Medicine)    Objective:    Vitals: BP 135/79 (BP Location: Left Arm, Patient Position: Sitting, Cuff Size: Large)   Pulse 85   Temp (!) 97.3 F (36.3 C) (Temporal)   Wt 147 lb (66.7 kg)   BMI 23.02 kg/m    Vitals:   11/14/19 1108  BP: 135/79  Pulse: 85  Temp: (!) 97.3 F (36.3 C)  TempSrc: Temporal  Weight: 147 lb (66.7 kg)     Physical Exam Vitals signs reviewed.  Constitutional:      General: She is not in acute distress.    Appearance: Normal appearance. She is well-developed. She is not diaphoretic.  HENT:     Head: Normocephalic and atraumatic.     Right Ear: Tympanic membrane, ear canal and external ear normal.     Left Ear: Tympanic membrane, ear canal and external ear normal.  Eyes:     General: No scleral icterus.    Conjunctiva/sclera: Conjunctivae normal.     Pupils: Pupils are equal, round, and reactive to light.  Neck:     Musculoskeletal: Neck supple.  Thyroid: No  thyromegaly.  Cardiovascular:     Rate and Rhythm: Normal rate and regular rhythm.     Pulses: Normal pulses.     Heart sounds: Normal heart sounds. No murmur.  Pulmonary:     Effort: Pulmonary effort is normal. No respiratory distress.     Breath sounds: Normal breath sounds. No wheezing or rales.  Abdominal:     General: There is no distension.     Palpations: Abdomen is soft.     Tenderness: There is no abdominal tenderness.  Musculoskeletal:        General: No deformity.     Right lower leg: No edema.     Left lower leg: No edema.  Lymphadenopathy:     Cervical: No cervical adenopathy.  Skin:    General: Skin is warm and dry.     Capillary Refill: Capillary refill takes less than 2 seconds.     Findings: No rash.  Neurological:     Mental Status: She is alert and oriented to person, place, and time. Mental status is at baseline.  Psychiatric:        Mood and Affect: Mood normal.        Behavior: Behavior normal.        Thought Content: Thought content normal.      Depression Screen PHQ 2/9 Scores 11/14/2019 08/22/2019 05/16/2019 04/12/2019  PHQ - 2 Score 6 2 3 4   PHQ- 9 Score 20 8 4 13        Assessment & Plan:     Routine Health Maintenance and Physical Exam  Exercise Activities and Dietary recommendations Goals   None     Immunization History  Administered Date(s) Administered  . Hepatitis A 10/26/2018    Health Maintenance  Topic Date Due  . HIV Screening  08/09/1989  . TETANUS/TDAP  08/09/1993  . PAP SMEAR-Modifier  12/22/2020  . INFLUENZA VACCINE  Completed     Discussed health benefits of physical activity, and encouraged her to engage in regular exercise appropriate for her age and condition.    --------------------------------------------------------------------  Problem List Items Addressed This Visit      Endocrine   Hypothyroidism    Has been followed by [facility] in [place] previously but is awaiting appt with Endocrinology Recheck TSH  and free T4      Relevant Orders   TSH + free T4     Other   ADD (attention deficit disorder)    Stable and well controlled Has stopped Adderall May be getting some improvement from Wellbutrin Continue to monitor      GAD (generalized anxiety disorder)    Well controlled Increase Wellbutrin as below for better depression control Have discussed SSRIs, but patient wary of potential side effects      Relevant Medications   buPROPion (WELLBUTRIN) 75 MG tablet   Moderate episode of recurrent major depressive disorder ([name])    Was previously doing well, but recent worsening Increase Wellbutrin to 150 mg twice daily Discussed importance of therapy Follow-up in 3 months and repeat PHQ-9 and GAD-7      Relevant Medications   buPROPion (WELLBUTRIN) 75 MG tablet    Other Visit Diagnoses    Encounter for annual physical exam    -  Primary   Relevant Orders   MM 3D SCREEN BREAST BILATERAL   CMP (Comprehensive metabolic panel)   Lipid panel   CBC   HIV antibody (with reflex)   Encounter for screening mammogram for malignant neoplasm of  breast       Relevant Orders   MM 3D SCREEN BREAST BILATERAL   Screening for HIV (human immunodeficiency virus)       Relevant Orders   HIV antibody (with reflex)       Return in about 3 months (around 02/12/2020) for MDD/GAD f/u.   The entirety of the information documented in the History of Present Illness, Review of Systems and Physical Exam were personally obtained by me. Portions of this information were initially documented by Ashley Royalty, CMA and reviewed by me for thoroughness and accuracy.    , Dionne Bucy, MD MPH [facility]

## 2019-11-14 NOTE — Assessment & Plan Note (Signed)
Stable and well controlled Has stopped Adderall May be getting some improvement from Wellbutrin Continue to monitor

## 2019-11-15 ENCOUNTER — Encounter: Payer: Self-pay | Admitting: Family Medicine

## 2019-11-15 ENCOUNTER — Telehealth: Payer: Self-pay

## 2019-11-15 LAB — CBC
Hematocrit: 39.1 % (ref 34.0–46.6)
Hemoglobin: 13.3 g/dL (ref 11.1–15.9)
MCH: 34.5 pg — ABNORMAL HIGH (ref 26.6–33.0)
MCHC: 34 g/dL (ref 31.5–35.7)
MCV: 102 fL — ABNORMAL HIGH (ref 79–97)
Platelets: 252 10*3/uL (ref 150–450)
RBC: 3.85 x10E6/uL (ref 3.77–5.28)
RDW: 11.5 % — ABNORMAL LOW (ref 11.7–15.4)
WBC: 4.9 10*3/uL (ref 3.4–10.8)

## 2019-11-15 LAB — COMPREHENSIVE METABOLIC PANEL
ALT: 19 IU/L (ref 0–32)
AST: 19 IU/L (ref 0–40)
Albumin/Globulin Ratio: 1.7 (ref 1.2–2.2)
Albumin: 4.3 g/dL (ref 3.8–4.8)
Alkaline Phosphatase: 43 IU/L (ref 39–117)
BUN/Creatinine Ratio: 17 (ref 9–23)
BUN: 18 mg/dL (ref 6–24)
Bilirubin Total: 0.3 mg/dL (ref 0.0–1.2)
CO2: 24 mmol/L (ref 20–29)
Calcium: 9 mg/dL (ref 8.7–10.2)
Chloride: 102 mmol/L (ref 96–106)
Creatinine, Ser: 1.06 mg/dL — ABNORMAL HIGH (ref 0.57–1.00)
GFR calc Af Amer: 73 mL/min/{1.73_m2} (ref >59)
GFR calc non Af Amer: 64 mL/min/{1.73_m2} (ref >59)
Globulin, Total: 2.5 g/dL (ref 1.5–4.5)
Glucose: 94 mg/dL (ref 65–99)
Potassium: 4.5 mmol/L (ref 3.5–5.2)
Sodium: 139 mmol/L (ref 134–144)
Total Protein: 6.8 g/dL (ref 6.0–8.5)

## 2019-11-15 LAB — TSH+FREE T4
Free T4: 0.97 ng/dL (ref 0.82–1.77)
TSH: 1.84 u[IU]/mL (ref 0.450–4.500)

## 2019-11-15 LAB — LIPID PANEL
Chol/HDL Ratio: 2.7 ratio (ref 0.0–4.4)
Cholesterol, Total: 214 mg/dL — ABNORMAL HIGH (ref 100–199)
HDL: 80 mg/dL (ref >39)
LDL Chol Calc (NIH): 124 mg/dL — ABNORMAL HIGH (ref 0–99)
Triglycerides: 55 mg/dL (ref 0–149)
VLDL Cholesterol Cal: 10 mg/dL (ref 5–40)

## 2019-11-15 LAB — HIV ANTIBODY (ROUTINE TESTING W REFLEX): HIV Screen 4th Generation wRfx: NONREACTIVE

## 2019-11-15 NOTE — Telephone Encounter (Signed)
Called [facility] to add Vit B12 for monocytosis.

## 2019-11-15 NOTE — Telephone Encounter (Signed)
Viewed by Rutherford Nail on 11/15/2019 8:36 AM Written by Virginia Crews, MD on 11/15/2019 8:10 AM Normal labs, except creatinine (kidney function) has increased slightly since it was last checked. Be sure to stay well hydrate. Cholesterol has increased since it was last checked. No need for medication at this time, but I recommend diet low in saturated fat and regular exercise - 30 min at least 5 times per week

## 2019-11-15 NOTE — Telephone Encounter (Signed)
-----   Message from Virginia Crews, MD sent at 11/15/2019  8:10 AM EST ----- Normal labs, except creatinine (kidney function) has increased slightly since it was last checked.  Be sure to stay well hydrate.  Cholesterol has increased since it was last checked.  No need for medication at this time, but I recommend diet low in saturated fat and regular exercise - 30 min at least 5 times per week

## 2019-11-18 ENCOUNTER — Telehealth: Payer: Self-pay

## 2019-11-18 NOTE — Telephone Encounter (Signed)
Viewed by Rutherford Nail on 11/18/2019 10:54 AM Written by Virginia Crews, MD on 11/18/2019 9:56 AM Normal B12 level

## 2019-11-18 NOTE — Telephone Encounter (Signed)
-----   Message from Virginia Crews, MD sent at 11/18/2019  9:56 AM EST ----- Normal B12 level

## 2019-11-20 ENCOUNTER — Ambulatory Visit
Admission: RE | Admit: 2019-11-20 | Discharge: 2019-11-20 | Disposition: A | Discharge status: Home / Self Care | Payer: Managed Care, Other (non HMO) | Source: Ambulatory Visit | Attending: Family Medicine | Admitting: Family Medicine

## 2019-11-20 ENCOUNTER — Telehealth: Payer: Self-pay

## 2019-11-20 ENCOUNTER — Other Ambulatory Visit: Payer: Self-pay

## 2019-11-20 DIAGNOSIS — Z0000 Encounter for general adult medical examination without abnormal findings: Secondary | ICD-10-CM

## 2019-11-20 DIAGNOSIS — Z1231 Encounter for screening mammogram for malignant neoplasm of breast: Secondary | ICD-10-CM | POA: Diagnosis present

## 2019-11-20 LAB — VITAMIN B12: Vitamin B-12: 734 pg/mL (ref 232–1245)

## 2019-11-20 LAB — SPECIMEN STATUS REPORT

## 2019-11-20 NOTE — Telephone Encounter (Signed)
-----   Message from Virginia Crews, MD sent at 11/20/2019  3:16 PM EST ----- Normal mammogram. Repeat in 1 yr

## 2019-11-20 NOTE — Telephone Encounter (Signed)
Tried calling; no answer.   Thanks,   -  

## 2019-11-20 NOTE — Telephone Encounter (Signed)
Patient advised as below.  

## 2019-12-31 DIAGNOSIS — K5909 Other constipation: Secondary | ICD-10-CM | POA: Insufficient documentation

## 2020-02-11 ENCOUNTER — Telehealth: Payer: Self-pay | Admitting: Oncology

## 2020-02-11 NOTE — Telephone Encounter (Signed)
Referral received for patient for Leukopenia. Writer phoned patient on this date and left voicemail requesting return call to schedule patient.

## 2020-02-13 ENCOUNTER — Other Ambulatory Visit: Payer: Self-pay

## 2020-02-13 ENCOUNTER — Inpatient Hospital Stay: Payer: Managed Care, Other (non HMO) | Attending: Oncology | Admitting: Oncology

## 2020-02-13 ENCOUNTER — Ambulatory Visit (INDEPENDENT_AMBULATORY_CARE_PROVIDER_SITE_OTHER): Payer: Managed Care, Other (non HMO) | Admitting: Family Medicine

## 2020-02-13 ENCOUNTER — Encounter: Payer: Self-pay | Admitting: Oncology

## 2020-02-13 VITALS — BP 122/78 | HR 70 | Temp 97.6°F | Resp 18 | Wt 152.0 lb

## 2020-02-13 DIAGNOSIS — Z793 Long term (current) use of hormonal contraceptives: Secondary | ICD-10-CM | POA: Diagnosis not present

## 2020-02-13 DIAGNOSIS — E039 Hypothyroidism, unspecified: Secondary | ICD-10-CM | POA: Insufficient documentation

## 2020-02-13 DIAGNOSIS — R5381 Other malaise: Secondary | ICD-10-CM | POA: Diagnosis not present

## 2020-02-13 DIAGNOSIS — M069 Rheumatoid arthritis, unspecified: Secondary | ICD-10-CM | POA: Diagnosis not present

## 2020-02-13 DIAGNOSIS — Z79899 Other long term (current) drug therapy: Secondary | ICD-10-CM | POA: Insufficient documentation

## 2020-02-13 DIAGNOSIS — D72819 Decreased white blood cell count, unspecified: Secondary | ICD-10-CM | POA: Diagnosis present

## 2020-02-13 DIAGNOSIS — Z791 Long term (current) use of non-steroidal anti-inflammatories (NSAID): Secondary | ICD-10-CM | POA: Diagnosis not present

## 2020-02-13 DIAGNOSIS — D7589 Other specified diseases of blood and blood-forming organs: Secondary | ICD-10-CM | POA: Insufficient documentation

## 2020-02-13 DIAGNOSIS — R5382 Chronic fatigue, unspecified: Secondary | ICD-10-CM | POA: Diagnosis not present

## 2020-02-13 NOTE — Progress Notes (Signed)
Visit canceled by patient.

## 2020-02-13 NOTE — Progress Notes (Signed)
Patient here to re-establish care with Dr. Janese Banks for leukopenia. Patient has tremors to left arm and gets daily headaches.

## 2020-02-14 MED ORDER — TRAZODONE HCL 150 MG PO TABS: ORAL_TABLET | ORAL | 11 refills | Status: DC

## 2020-02-14 MED ORDER — BUPROPION HCL 75 MG PO TABS: 150.0000 mg | ORAL_TABLET | Freq: Two times a day (BID) | ORAL | 3 refills | Status: DC

## 2020-02-14 MED ORDER — SUMATRIPTAN SUCCINATE 100 MG PO TABS: ORAL_TABLET | ORAL | 5 refills | Status: DC

## 2020-02-14 MED ORDER — NARATRIPTAN HCL 2.5 MG PO TABS: 2.5000 mg | ORAL_TABLET | ORAL | 5 refills | Status: DC | PRN

## 2020-02-14 MED ORDER — MELOXICAM 15 MG PO TABS: 15.0000 mg | ORAL_TABLET | Freq: Every day | ORAL | 3 refills | Status: DC

## 2020-02-14 NOTE — Progress Notes (Signed)
Hematology/Oncology Consult note [facility]  [phone] [phone]  Patient Care Team: Virginia Crews, MD as PCP - General (Family Medicine)   Name of the patient: [name]  [id number]  [birth year]   Date of visit: 02/14/20  Diagnosis-transient leukopenia  Chief complaint/ Reason for visit-referred for leukopenia.  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  Past medical history is significant for hypothyroidism and chronic fatigue.  Her bloodwork was as follows: CBC from 03/30/2017 white count of 4.1, H&H of 13.6/41.9 with an MCV of 103.2 and platelet count of 230. Differential on the CBC was normal. CMP showed elevated AST and ALT of 52 and 54 respectively. Her rheumatologist closer all the workup was essentially negative except for a positive ANA. She has a history of rheumatoid arthritis and her mother and grandfather but based on her blood work she has not been found to have any sero- positive arthritis or vasculitis. She also has a history of hypothyroidism and has been slowly increasing her levothyroxine dose and is currently on 25 g.  She was referred in the past for mildly elevated iron level of 175 and was then lost to follow-up.  More recently patient was seen by endocrinology for symptoms of hypothyroidism as a part of her work-up CBC was checked which showed a low white cell count of 3.8 and was therefore referred to [place].  Interval history-patient reports she has no energy after she finishes her work.  Previously she is to be able to work out 2-3 times a week but currently she is not able to do so.  She is also concerned that she may have lymph nodes in her groin.  Appetite and weight have remained stable  ECOG PS- 1 Pain scale- 0 Opioid associated constipation- no  Review of systems- Review of Systems  Constitutional: Positive for  malaise/fatigue. Negative for chills, fever and weight loss.  HENT: Negative for congestion, ear discharge and nosebleeds.   Eyes: Negative for blurred vision.  Respiratory: Negative for cough, hemoptysis, sputum production, shortness of breath and wheezing.   Cardiovascular: Negative for chest pain, palpitations, orthopnea and claudication.  Gastrointestinal: Negative for abdominal pain, blood in stool, constipation, diarrhea, heartburn, melena, nausea and vomiting.  Genitourinary: Negative for dysuria, flank pain, frequency, hematuria and urgency.  Musculoskeletal: Negative for back pain, joint pain and myalgias.  Skin: Negative for rash.  Neurological: Negative for dizziness, tingling, focal weakness, seizures, weakness and headaches.  Endo/Heme/Allergies: Does not bruise/bleed easily.  Psychiatric/Behavioral: Negative for depression and suicidal ideas. The patient does not have insomnia.        Allergies  Allergen Reactions  . Penicillins Swelling    Pt states caused throat to swell   . Cefdinir Palpitations  . Sulfa Antibiotics Other (See Comments)    Lips tingled     Past Medical History:  Diagnosis Date  . ADD (attention deficit disorder)   . Bone spur    "front of spine" - effects right shoulder (sometimes)  . Depression   . Deviated nasal septum   . Hypothyroidism   . Nondisplaced fracture of fifth left metatarsal bone   . PTSD (post-traumatic stress disorder)   . RA (rheumatoid arthritis) ([place]) 2016  . Rheumatoid arthritis ([place]) 12/09/2016  . Sinusitis      Past Surgical History:  Procedure Laterality Date  . CESAREAN SECTION    . DILATION  AND CURETTAGE OF UTERUS     (x2)  . FRONTAL SINUS EXPLORATION Bilateral 11/19/2015   Procedure: FRONTAL SINUS EXPLORATION;  Surgeon: Margaretha Sheffield, MD;  Location: [place];  Service: ENT;  Laterality: Bilateral;  . IMAGE GUIDED SINUS SURGERY N/A 11/19/2015   Procedure: IMAGE GUIDED SINUS SURGERY;  Surgeon: Margaretha Sheffield, MD;  Location: [place];  Service: ENT;  Laterality: N/A;  GAVE DISK TO CE CE 09/22  . MAXILLARY ANTROSTOMY Bilateral 11/19/2015   Procedure: MAXILLARY ANTROSTOMY;  Surgeon: Margaretha Sheffield, MD;  Location: [place];  Service: ENT;  Laterality: Bilateral;  . SEPTOPLASTY N/A 11/19/2015   Procedure: SEPTOPLASTY;  Surgeon: Margaretha Sheffield, MD;  Location: [place];  Service: ENT;  Laterality: N/A;  . SPHENOIDECTOMY Bilateral 11/19/2015   Procedure: Coralee Pesa;  Surgeon: Margaretha Sheffield, MD;  Location: [place];  Service: ENT;  Laterality: Bilateral;  . TONSILLECTOMY      Social History   Socioeconomic History  . Marital status: Divorced    Spouse name: Not on file  . Number of children: 2  . Years of education: Not on file  . Highest education level: Not on file  Occupational History    Employer: [employer]   Tobacco Use  . Smoking status: Never Smoker  . Smokeless tobacco: Never Used  Substance and Sexual Activity  . Alcohol use: Yes    Alcohol/week: 0.0 standard drinks    Comment: rare- wine   . Drug use: Never  . Sexual activity: Yes    Birth control/protection: Pill  Other Topics Concern  . Not on file  Social History Narrative  . Not on file   Social Determinants of Health   Financial Resource Strain:   . Difficulty of Paying Living Expenses: Not on file  Food Insecurity:   . Worried About Charity fundraiser in the Last Year: Not on file  . Ran Out of Food in the Last Year: Not on file  Transportation Needs:   . Lack of Transportation (Medical): Not on file  . Lack of Transportation (Non-Medical): Not on file  Physical Activity:   . Days of Exercise per Week: Not on file  . Minutes of Exercise per Session: Not on file  Stress:   . Feeling of Stress : Not on file  Social Connections:   . Frequency of Communication with Friends and Family: Not on file  . Frequency of Social Gatherings with Friends and  Family: Not on file  . Attends Religious Services: Not on file  . Active Member of Clubs or Organizations: Not on file  . Attends Archivist Meetings: Not on file  . Marital Status: Not on file  Intimate Partner Violence:   . Fear of Current or Ex-Partner: Not on file  . Emotionally Abused: Not on file  . Physically Abused: Not on file  . Sexually Abused: Not on file    Family History  Problem Relation Age of Onset  . Rheum arthritis Mother   . Cancer Paternal Grandfather   . Cancer Cousin   . Vascular Disease Father   . Alcohol abuse Brother        history of   . Drug abuse Brother        history of   . Breast cancer Neg Hx      Current Outpatient Medications:  .  albuterol (PROVENTIL) (2.5 MG/3ML) 0.083% nebulizer solution, Take 3 mLs (2.5 mg total) by nebulization every 6 (  six) hours as needed for wheezing or shortness of breath., Disp: 150 mL, Rfl: 1 .  albuterol (VENTOLIN HFA) 108 (90 Base) MCG/ACT inhaler, INHALE 2 PUFFS INTO THE LUNGS EVERY 6 HOURS AS NEEDED FOR WHEEZING OR SHORTNESS OF BREATH, Disp: 8.5 g, Rfl: 2 .  buPROPion (WELLBUTRIN) 75 MG tablet, Take 2 tablets (150 mg total) by mouth 2 (two) times daily., Disp: 120 tablet, Rfl: 3 .  LINZESS 145 MCG CAPS capsule, Take 145 mcg by mouth daily., Disp: , Rfl:  .  meloxicam (MOBIC) 15 MG tablet, TAKE 1 TABLET BY MOUTH EVERY DAY, Disp: 90 tablet, Rfl: 3 .  naratriptan (AMERGE) 2.5 MG tablet, Take 2.5 mg by mouth as needed for migraine. Take one (1) tablet at onset of headache; if returns or does not resolve, may repeat after 4 hours; do not exceed five (5) mg in 24 hours., Disp: , Rfl:  .  norethindrone-ethinyl estradiol-iron (LOESTRIN FE) 1.5-30 MG-MCG tablet, Take 1 tablet by mouth daily., Disp: , Rfl:  .  SUMAtriptan (IMITREX) 100 MG tablet, TK 1 T PO AOS OF HEADACHE. REPEAT IN 2 H AS NEEDED. MAX DOSE 2 T IN 24 H, Disp: , Rfl:  .  traZODone (DESYREL) 150 MG tablet, TAKE 1/2 TABLET(75 MG) BY MOUTH AT BEDTIME,  Disp: 45 tablet, Rfl: 11 .  amphetamine-dextroamphetamine (ADDERALL) 15 MG tablet, Take 1 tablet by mouth 2 (two) times daily. (Patient not taking: Reported on 02/13/2020), Disp: 60 tablet, Rfl: 0 .  amphetamine-dextroamphetamine (ADDERALL) 15 MG tablet, Take 1 tablet by mouth 2 (two) times daily. (Patient not taking: Reported on 11/14/2019), Disp: 60 tablet, Rfl: 0 .  amphetamine-dextroamphetamine (ADDERALL) 15 MG tablet, Take 1 tablet by mouth 2 (two) times daily. (Patient not taking: Reported on 11/14/2019), Disp: 60 tablet, Rfl: 0 .  liothyronine (CYTOMEL) 5 MCG tablet, Take 25 mcg by mouth daily. , Disp: , Rfl:  .  MICROGESTIN FE 1/20 1-20 MG-MCG tablet, , Disp: , Rfl:   Physical exam:  Vitals:   02/13/20 1534  BP: 122/78  Pulse: 70  Resp: 18  Temp: 97.6 F (36.4 C)  TempSrc: Tympanic  SpO2: 100%  Weight: 152 lb (68.9 kg)   Physical Exam HENT:     Head: Normocephalic and atraumatic.  Eyes:     Pupils: Pupils are equal, round, and reactive to light.  Cardiovascular:     Rate and Rhythm: Normal rate and regular rhythm.     Heart sounds: Normal heart sounds.  Pulmonary:     Effort: Pulmonary effort is normal.     Breath sounds: Normal breath sounds.  Abdominal:     General: Bowel sounds are normal.     Palpations: Abdomen is soft.     Comments: Patient reports oft tissue swelling over labia majora but I do not palpate any swelling  Musculoskeletal:     Cervical back: Normal range of motion.  Lymphadenopathy:     Comments: No palpable cervical, supraclavicular, axillary adenopathy. She has tiny b/l pea sized nodes in the inguinal region which do not feel enlarged or abnormal on palpation   Skin:    General: Skin is warm and dry.  Neurological:     Mental Status: She is alert and oriented to person, place, and time.      CMP Latest Ref Rng & Units 11/14/2019  Glucose 65 - 99 mg/dL 94  BUN 6 - 24 mg/dL 18  Creatinine 0.57 - 1.00 mg/dL 1.06(H)  Sodium 134 - 144 mmol/L 139  Potassium 3.5 - 5.2 mmol/L 4.5  Chloride 96 - 106 mmol/L 102  CO2 20 - 29 mmol/L 24  Calcium 8.7 - 10.2 mg/dL 9.0  Total Protein 6.0 - 8.5 g/dL 6.8  Total Bilirubin 0.0 - 1.2 mg/dL 0.3  Alkaline Phos 39 - 117 IU/L 43  AST 0 - 40 IU/L 19  ALT 0 - 32 IU/L 19   CBC Latest Ref Rng & Units 11/14/2019  WBC 3.4 - 10.8 x10E3/uL 4.9  Hemoglobin 11.1 - 15.9 g/dL 13.3  Hematocrit 34.0 - 46.6 % 39.1  Platelets 150 - 450 x10E3/uL 252      Assessment and plan- Patient is a 44 y.o. female referred for leukopenia  Looking back at patient's prior CBC from 2008 and) in patients white cell count has been in the lower end of normal limits between 4-5.  Her most recent blood work done at [facility] showed a white cell count of 3.8.  Hemoglobin and platelets have been normal.  Patient does have chronic macrocytosis of unclear etiology and her MCV typically ranges between 100-102.  B12 and folate as well as TSH have been normal.  Previously she has had myeloma panel checked in 2018 which did not show any evidence of M protein.  Clinically I do not palpate any significant adenopathy.  Also her transiently low white cell count of 3.8 is not explain her ongoing symptoms of fatigue which have been going on for the last 3 years.  I do not suspect any primary malignancy based on my exam and her symptoms.  I will plan to check a CBC with differential in 1 month's time.  If counts are normal at that time patient does not require hematology follow-up.  However if patient has persistent leukopenia, I will consider a bone marrow biopsy at that time   Visit Diagnosis 1. Leukopenia, unspecified type      Dr. Randa Evens, MD, MPH [facility] [phone] 02/14/2020 10:30 AM

## 2020-02-14 NOTE — Telephone Encounter (Signed)
Left detailed message on voicemail.  

## 2020-02-19 DIAGNOSIS — S63639A Sprain of interphalangeal joint of unspecified finger, initial encounter: Secondary | ICD-10-CM | POA: Insufficient documentation

## 2020-02-21 ENCOUNTER — Encounter: Payer: Self-pay | Admitting: Family Medicine

## 2020-02-21 DIAGNOSIS — B3731 Acute candidiasis of vulva and vagina: Secondary | ICD-10-CM

## 2020-02-21 DIAGNOSIS — B373 Candidiasis of vulva and vagina: Secondary | ICD-10-CM

## 2020-02-21 MED ORDER — FLUCONAZOLE 150 MG PO TABS: 150.0000 mg | ORAL_TABLET | Freq: Once | ORAL | 0 refills | Status: AC

## 2020-02-26 ENCOUNTER — Other Ambulatory Visit: Payer: Self-pay | Admitting: Physician Assistant

## 2020-02-26 DIAGNOSIS — J4541 Moderate persistent asthma with (acute) exacerbation: Secondary | ICD-10-CM

## 2020-02-26 NOTE — Telephone Encounter (Signed)
Requested Prescriptions  Pending Prescriptions Disp Refills  . albuterol (VENTOLIN HFA) 108 (90 Base) MCG/ACT inhaler [Pharmacy Med Name: ALBUTEROL HFA INH (200 PUFFS)8.5GM] 8.5 g 2    Sig: INHALE 2 PUFFS INTO THE LUNGS EVERY 6 HOURS AS NEEDED FOR WHEEZING OR SHORTNESS OF BREATH     Pulmonology:  Beta Agonists Failed - 02/26/2020  3:37 AM      Failed - One inhaler should last at least one month. If the patient is requesting refills earlier, contact the patient to check for uncontrolled symptoms.      Passed - Valid encounter within last 12 months    Recent Outpatient Visits          1 week ago Erroneous encounter - disregard   [facility] [place], Dionne Bucy, MD   3 months ago Encounter for annual physical exam   [facility] [place], Dionne Bucy, MD   3 months ago Acute recurrent maxillary sinusitis   [facility], Dionne Bucy, MD   6 months ago Attention deficit hyperactivity disorder (ADHD), combined type   [facility], Dionne Bucy, MD   8 months ago Acute recurrent maxillary sinusitis   [facility], Dionne Bucy, MD

## 2020-03-18 ENCOUNTER — Inpatient Hospital Stay: Payer: Managed Care, Other (non HMO)

## 2020-05-12 DIAGNOSIS — J4530 Mild persistent asthma, uncomplicated: Secondary | ICD-10-CM | POA: Diagnosis not present

## 2020-05-12 DIAGNOSIS — J300 Vasomotor rhinitis: Secondary | ICD-10-CM | POA: Diagnosis not present

## 2020-05-12 DIAGNOSIS — H1045 Other chronic allergic conjunctivitis: Secondary | ICD-10-CM | POA: Diagnosis not present

## 2020-05-13 ENCOUNTER — Other Ambulatory Visit: Payer: Self-pay

## 2020-05-13 ENCOUNTER — Encounter: Payer: Self-pay | Admitting: Family Medicine

## 2020-05-13 ENCOUNTER — Ambulatory Visit (INDEPENDENT_AMBULATORY_CARE_PROVIDER_SITE_OTHER): Payer: Medicaid Other | Admitting: Family Medicine

## 2020-05-13 VITALS — BP 110/71 | HR 90 | Temp 97.1°F | Resp 16 | Wt 167.0 lb

## 2020-05-13 DIAGNOSIS — E663 Overweight: Secondary | ICD-10-CM | POA: Insufficient documentation

## 2020-05-13 DIAGNOSIS — F902 Attention-deficit hyperactivity disorder, combined type: Secondary | ICD-10-CM

## 2020-05-13 DIAGNOSIS — F411 Generalized anxiety disorder: Secondary | ICD-10-CM

## 2020-05-13 DIAGNOSIS — E039 Hypothyroidism, unspecified: Secondary | ICD-10-CM | POA: Diagnosis not present

## 2020-05-13 DIAGNOSIS — J4541 Moderate persistent asthma with (acute) exacerbation: Secondary | ICD-10-CM | POA: Diagnosis not present

## 2020-05-13 DIAGNOSIS — M5412 Radiculopathy, cervical region: Secondary | ICD-10-CM | POA: Diagnosis not present

## 2020-05-13 DIAGNOSIS — F331 Major depressive disorder, recurrent, moderate: Secondary | ICD-10-CM

## 2020-05-13 MED ORDER — LISDEXAMFETAMINE DIMESYLATE 30 MG PO CAPS: 30.0000 mg | ORAL_CAPSULE | Freq: Every day | ORAL | 0 refills | Status: DC

## 2020-05-13 MED ORDER — BUPROPION HCL 75 MG PO TABS: ORAL_TABLET | ORAL | 3 refills | Status: DC

## 2020-05-13 MED ORDER — ALBUTEROL SULFATE HFA 108 (90 BASE) MCG/ACT IN AERS: 1.0000 | INHALATION_SPRAY | Freq: Four times a day (QID) | RESPIRATORY_TRACT | 2 refills | Status: DC | PRN

## 2020-05-13 MED ORDER — LISDEXAMFETAMINE DIMESYLATE 30 MG PO CAPS
30.0000 mg | ORAL_CAPSULE | Freq: Every day | ORAL | 0 refills | Status: DC
Start: 2020-05-13 — End: 2020-06-08

## 2020-05-13 NOTE — Assessment & Plan Note (Addendum)
Has been off Adderall for about 8 months - now worsening with new job Will start Vyvanse 30mg  daily Follow up in 3 months

## 2020-05-13 NOTE — Assessment & Plan Note (Signed)
Recurrent  Referred to [facility] as below, patient request

## 2020-05-13 NOTE — Assessment & Plan Note (Addendum)
Improved Does not work for the [employer] department any more Continue Wellbutrin 75mg  2 tablets in the morning and 1 in the afternoon Follow up in 3 months

## 2020-05-13 NOTE — Assessment & Plan Note (Addendum)
Stable, has been followed by [facility] Has seen Dr. Gabriel Carina in the past Patient advised to continue treatment with Dr. Gala Romney again to stop T3 supplement as this is not evidenced based Discussed lack of evidence for monitoring reverse T3 Patient agrees to f/u with Dr Gabriel Carina

## 2020-05-13 NOTE — Progress Notes (Signed)
Established patient visit   Patient: [name]   DOB: 07-Jul-1976   44 y.o. Female  MRN: [id number] Visit Date: 05/13/2020  I,Sulibeya S Dimas,acting as a scribe for Lavon Paganini, MD.,have documented all relevant documentation on the behalf of Lavon Paganini, MD,as directed by  Lavon Paganini, MD while in the presence of Lavon Paganini, MD. Today's healthcare provider: Lavon Paganini, MD   Chief Complaint  Patient presents with  . Hypothyroidism  . Depression  . ADHD  . Anxiety   Subjective    HPI Hypothyroid, follow-up  Lab Results  Component Value Date   TSH 1.840 11/14/2019   TSH 1.13 04/10/2018   TSH 1.500 02/19/2016   FREET4 0.97 11/14/2019   T4TOTAL 11.4 02/19/2016   Wt Readings from Last 3 Encounters:  05/13/20 167 lb (75.8 kg)  02/13/20 152 lb (68.9 kg)  11/14/19 147 lb (66.7 kg)    She was last seen for hypothyroid 6 months ago.  Management since that visit includes no changes. She reports excellent compliance with treatment. She is not having side effects.   Symptoms: No change in energy level No constipation  No diarrhea No heat / cold intolerance  No nervousness No palpitations  Yes weight changes    ----------------------------------------------------------------------------------------- Depression/Anxiety, Follow-up  She  was last seen for this 6 months ago. Changes made at last visit include no changes.   She reports excellent compliance with treatment. Patient reports taking Wellbutrin 225mg  daily. She is not having side effects.   She reports excellent tolerance of treatment. Patient reports she is feeling better since switching job. Current symptoms include: difficulty concentrating She feels she is Improved since last visit.  Depression screen [facility] 2/9 05/13/2020 11/14/2019 08/22/2019  Decreased Interest 0 3 1  Down, Depressed, Hopeless 0 3 1  PHQ - 2 Score 0 6 2  Altered sleeping 1 3 1   Tired, decreased energy  0 3 1  Change in appetite 1 3 1   Feeling bad or failure about yourself  0 2 0  Trouble concentrating 2 3 2   Moving slowly or fidgety/restless 3 0 1  Suicidal thoughts 0 0 0  PHQ-9 Score 7 20 8   Difficult doing work/chores Somewhat difficult Very difficult Somewhat difficult    ----------------------------------------------------------------------------------------- Follow up for ADHD  The patient was last seen for this 6 months ago. Changes made at last visit include no changes.  She reports excellent compliance with treatment. Patient reports she has been off Adderall 5-6 months. She feels that condition is Improved. She is not having side effects.   -----------------------------------------------------------------------------------------    Patient Active Problem List   Diagnosis Date Noted  . Cervical radiculitis 05/13/2020  . Overweight 05/13/2020  . GAD (generalized anxiety disorder) 03/15/2019  . Moderate episode of recurrent major depressive disorder ([place]) 03/15/2019  . Rheumatoid arthritis ([place]) 11/01/2018  . Primary osteoarthritis of both hands 05/11/2018  . Primary osteoarthritis of both feet 05/11/2018  . Raynaud's disease without gangrene 05/11/2018  . Asthma 04/20/2018  . Onychomycosis 04/20/2018  . Family history of rheumatoid arthritis 04/10/2018  . Dry mouth 01/23/2018  . Radicular pain in right arm 10/16/2017  . PTSD (post-traumatic stress disorder) 10/16/2017  . Insomnia 10/16/2017  . Migraine with aura 10/16/2017  . Hypothyroidism   . ADD (attention deficit disorder)   . Uterine leiomyoma 09/22/2017  . 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 History of uterine fibroid 09/22/2017  . Polyarthralgia 12/09/2016   Social History   Tobacco  Use  . Smoking status: Never Smoker  . Smokeless tobacco: Never Used  Substance Use Topics  . Alcohol use: Yes    Alcohol/week: 0.0 standard drinks    Comment: rare- wine   . Drug use: Never        Medications: Outpatient Medications Prior to Visit  Medication Sig  . albuterol (PROVENTIL) (2.5 MG/3ML) 0.083% nebulizer solution Take 3 mLs (2.5 mg total) by nebulization every 6 (six) hours as needed for wheezing or shortness of breath.  . estradiol (ESTRACE) 1 MG tablet Take 1 mg by mouth daily.  . fluticasone (FLONASE) 50 MCG/ACT nasal spray Place into both nostrils daily.  Marland Kitchen LEVOCETIRIZINE DIHYDROCHLORIDE PO Take by mouth.  Marland Kitchen LINZESS 145 MCG CAPS capsule Take 145 mcg by mouth daily.  Marland Kitchen liothyronine (CYTOMEL) 5 MCG tablet Take 25 mcg by mouth daily.   . meloxicam (MOBIC) 15 MG tablet Take 1 tablet (15 mg total) by mouth daily.  . montelukast (SINGULAIR) 10 MG tablet Take 10 mg by mouth at bedtime.  . naratriptan (AMERGE) 2.5 MG tablet Take 1 tablet (2.5 mg total) by mouth as needed for migraine. Take one (1) tablet at onset of headache; if returns or does not resolve, may repeat after 4 hours; do not exceed five (5) mg in 24 hours.  . norethindrone-ethinyl estradiol-iron (LOESTRIN FE) 1.5-30 MG-MCG tablet Take 1 tablet by mouth daily.  . SUMAtriptan (IMITREX) 100 MG tablet TK 1 T PO AOS OF HEADACHE. REPEAT IN 2 H AS NEEDED. MAX DOSE 2 T IN 24 H  . traZODone (DESYREL) 150 MG tablet TAKE 1/2 TABLET(75 MG) BY MOUTH AT BEDTIME  . [DISCONTINUED] albuterol (VENTOLIN HFA) 108 (90 Base) MCG/ACT inhaler INHALE 2 PUFFS INTO THE LUNGS EVERY 6 HOURS AS NEEDED FOR WHEEZING OR SHORTNESS OF BREATH  . [DISCONTINUED] buPROPion (WELLBUTRIN) 75 MG tablet Take 2 tablets (150 mg total) by mouth 2 (two) times daily.  Marland Kitchen MICROGESTIN FE 1/20 1-20 MG-MCG tablet   . [DISCONTINUED] amphetamine-dextroamphetamine (ADDERALL) 15 MG tablet Take 1 tablet by mouth 2 (two) times daily. (Patient not taking: Reported on 02/13/2020)  . [DISCONTINUED] amphetamine-dextroamphetamine (ADDERALL) 15 MG tablet Take 1 tablet by mouth 2 (two) times daily. (Patient not taking: Reported on 11/14/2019)  . [DISCONTINUED]  amphetamine-dextroamphetamine (ADDERALL) 15 MG tablet Take 1 tablet by mouth 2 (two) times daily. (Patient not taking: Reported on 11/14/2019)   No facility-administered medications prior to visit.    Review of Systems  Constitutional: Positive for activity change. Negative for fatigue.  Respiratory: Negative for cough and shortness of breath.   Cardiovascular: Negative for chest pain and palpitations.  Psychiatric/Behavioral: Positive for decreased concentration.    Last metabolic panel Lab Results  Component Value Date   GLUCOSE 94 11/14/2019   NA 139 11/14/2019   K 4.5 11/14/2019   CL 102 11/14/2019   CO2 24 11/14/2019   BUN 18 11/14/2019   CREATININE 1.06 (H) 11/14/2019   GFRNONAA 64 11/14/2019   GFRAA 73 11/14/2019   CALCIUM 9.0 11/14/2019   PHOS 2.9 02/19/2016   PROT 6.8 11/14/2019   ALBUMIN 4.3 11/14/2019   LABGLOB 2.5 11/14/2019   AGRATIO 1.7 11/14/2019   BILITOT 0.3 11/14/2019   ALKPHOS 43 11/14/2019   AST 19 11/14/2019   ALT 19 11/14/2019   ANIONGAP 9 06/13/2017   Last thyroid functions Lab Results  Component Value Date   TSH 1.840 11/14/2019   T4TOTAL 11.4 02/19/2016      Objective    BP 110/71 (  BP Location: Left Arm, Patient Position: Sitting, Cuff Size: Large)   Pulse 90   Temp (!) 97.1 F (36.2 C) (Temporal)   Resp 16   Wt 167 lb (75.8 kg)   BMI 26.16 kg/m  BP Readings from Last 3 Encounters:  05/13/20 110/71  02/13/20 122/78  11/14/19 135/79   Wt Readings from Last 3 Encounters:  05/13/20 167 lb (75.8 kg)  02/13/20 152 lb (68.9 kg)  11/14/19 147 lb (66.7 kg)      Physical Exam Vitals reviewed.  Constitutional:      General: She is not in acute distress.    Appearance: Normal appearance. She is well-developed. She is not diaphoretic.  HENT:     Head: Normocephalic and atraumatic.  Eyes:     General: No scleral icterus.    Conjunctiva/sclera: Conjunctivae normal.  Neck:     Thyroid: No thyromegaly.  Cardiovascular:     Rate and  Rhythm: Normal rate and regular rhythm.     Pulses: Normal pulses.     Heart sounds: Normal heart sounds. No murmur.  Pulmonary:     Effort: Pulmonary effort is normal. No respiratory distress.     Breath sounds: Normal breath sounds. No wheezing, rhonchi or rales.  Musculoskeletal:     Cervical back: Neck supple.     Right lower leg: No edema.     Left lower leg: No edema.  Lymphadenopathy:     Cervical: No cervical adenopathy.  Skin:    General: Skin is warm and dry.     Findings: No rash.  Neurological:     Mental Status: She is alert and oriented to person, place, and time. Mental status is at baseline.  Psychiatric:        Mood and Affect: Mood normal.        Behavior: Behavior normal.      No results found for any visits on 05/13/20.  Assessment & Plan     Problem List Items Addressed This Visit      Endocrine   Hypothyroidism    Stable, has been followed by [facility] Has seen Dr. Gabriel Carina in the past Patient advised to continue treatment with Dr. Gala Romney again to stop T3 supplement as this is not evidenced based Discussed lack of evidence for monitoring reverse T3 Patient agrees to f/u with Dr Benson [place] and Auditory   Cervical radiculitis    Recurrent  Referred to [facility] as below, patient request      Relevant Medications   lisdexamfetamine (VYVANSE) 30 MG capsule   lisdexamfetamine (VYVANSE) 30 MG capsule   lisdexamfetamine (VYVANSE) 30 MG capsule   buPROPion (WELLBUTRIN) 75 MG tablet   Other Relevant Orders   Ambulatory referral to Physical Medicine Rehab     Other   ADD (attention deficit disorder) - Primary    Has been off Adderall for about 8 months - now worsening with new job Will start Vyvanse 30mg  daily Follow up in 3 months      GAD (generalized anxiety disorder)    Stable Continue Wellbutrin 75mg  at 2 tablets in the morning and 1 tablet in the evening      Relevant Medications   buPROPion (WELLBUTRIN) 75 MG tablet    Moderate episode of recurrent major depressive disorder ([place])    Improved Does not work for the [employer] department any more Continue Wellbutrin 75mg  2 tablets in the morning and 1 in the afternoon Follow up in 3  months      Relevant Medications   buPROPion (WELLBUTRIN) 75 MG tablet   Overweight    Discussed importance of healthy weight management Discussed diet and exercise        Other Visit Diagnoses    Moderate persistent asthmatic bronchitis with acute exacerbation       Relevant Medications   montelukast (SINGULAIR) 10 MG tablet   albuterol (VENTOLIN HFA) 108 (90 Base) MCG/ACT inhaler       Return in about 3 months (around 08/13/2020) for chronic disease f/u.      I, Lavon Paganini, MD, have reviewed all documentation for this visit. The documentation on 05/13/20 for the exam, diagnosis, procedures, and orders are all accurate and complete.   , Dionne Bucy, MD, MPH [facility]

## 2020-05-13 NOTE — Assessment & Plan Note (Signed)
Discussed importance of healthy weight management Discussed diet and exercise  

## 2020-05-13 NOTE — Assessment & Plan Note (Signed)
Stable Continue Wellbutrin 75mg  at 2 tablets in the morning and 1 tablet in the evening

## 2020-05-22 ENCOUNTER — Other Ambulatory Visit: Payer: Self-pay

## 2020-05-22 ENCOUNTER — Encounter: Payer: Self-pay | Admitting: Physician Assistant

## 2020-05-22 ENCOUNTER — Ambulatory Visit (INDEPENDENT_AMBULATORY_CARE_PROVIDER_SITE_OTHER): Payer: Medicaid Other | Admitting: Physician Assistant

## 2020-05-22 VITALS — BP 109/77 | HR 93 | Temp 97.0°F | Resp 16 | Wt 154.0 lb

## 2020-05-22 DIAGNOSIS — H6502 Acute serous otitis media, left ear: Secondary | ICD-10-CM

## 2020-05-22 DIAGNOSIS — B379 Candidiasis, unspecified: Secondary | ICD-10-CM | POA: Diagnosis not present

## 2020-05-22 DIAGNOSIS — H6982 Other specified disorders of Eustachian tube, left ear: Secondary | ICD-10-CM | POA: Diagnosis not present

## 2020-05-22 DIAGNOSIS — T3695XA Adverse effect of unspecified systemic antibiotic, initial encounter: Secondary | ICD-10-CM

## 2020-05-22 MED ORDER — DOXYCYCLINE HYCLATE 100 MG PO TABS: 100.0000 mg | ORAL_TABLET | Freq: Two times a day (BID) | ORAL | 0 refills | Status: DC

## 2020-05-22 MED ORDER — FLUCONAZOLE 150 MG PO TABS: 150.0000 mg | ORAL_TABLET | Freq: Once | ORAL | 0 refills | Status: AC

## 2020-05-22 MED ORDER — CIPROFLOXACIN-DEXAMETHASONE 0.3-0.1 % OT SUSP: 4.0000 [drp] | Freq: Two times a day (BID) | OTIC | 0 refills | Status: DC

## 2020-05-22 NOTE — Progress Notes (Signed)
Established patient visit   Patient: [name]   DOB: 1976/06/11   44 y.o. Female  MRN: [id number] Visit Date: 05/22/2020  Today's healthcare provider: Mar Daring, PA-C   No chief complaint on file.  Subjective    Otalgia  There is pain in the left ear. This is a new problem. Episode onset: started having fullness on left ear on Sunday. The problem occurs constantly. There has been no fever. Associated symptoms include coughing, headaches ("sinus headache") and hearing loss. Pertinent negatives include no abdominal pain, ear discharge, rhinorrhea or sore throat. Associated symptoms comments: "crackling sounds" like a rice crispy sound.. Treatments tried: Sudafed and peroxide and flonase. The treatment provided no relief.   Wt Readings from Last 3 Encounters:  05/22/20 154 lb (69.9 kg)  05/13/20 167 lb (75.8 kg)  02/13/20 152 lb (68.9 kg)    Patient Active Problem List   Diagnosis Date Noted  . Cervical radiculitis 05/13/2020  . Overweight 05/13/2020  . GAD (generalized anxiety disorder) 03/15/2019  . Moderate episode of recurrent major depressive disorder ([place]) 03/15/2019  . Rheumatoid arthritis ([place]) 11/01/2018  . Primary osteoarthritis of both hands 05/11/2018  . Primary osteoarthritis of both feet 05/11/2018  . Raynaud's disease without gangrene 05/11/2018  . Asthma 04/20/2018  . Onychomycosis 04/20/2018  . Family history of rheumatoid arthritis 04/10/2018  . Dry mouth 01/23/2018  . PTSD (post-traumatic stress disorder) 10/16/2017  . Insomnia 10/16/2017  . Migraine with aura 10/16/2017  . Hypothyroidism   . ADD (attention deficit disorder)   . Uterine leiomyoma 09/22/2017  . 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 History of uterine fibroid 09/22/2017  . Polyarthralgia 12/09/2016   Past Medical History:  Diagnosis Date  . ADD (attention deficit disorder)   . Bone spur    "front of spine" - effects right shoulder (sometimes)  . Depression   .  Deviated nasal septum   . Hypothyroidism   . Nondisplaced fracture of fifth left metatarsal bone   . PTSD (post-traumatic stress disorder)   . RA (rheumatoid arthritis) ([place]) 2016  . Rheumatoid arthritis ([place]) 12/09/2016  . Sinusitis        Medications: Outpatient Medications Prior to Visit  Medication Sig  . albuterol (PROVENTIL) (2.5 MG/3ML) 0.083% nebulizer solution Take 3 mLs (2.5 mg total) by nebulization every 6 (six) hours as needed for wheezing or shortness of breath.  Marland Kitchen albuterol (VENTOLIN HFA) 108 (90 Base) MCG/ACT inhaler Inhale 1-2 puffs into the lungs every 6 (six) hours as needed for wheezing or shortness of breath.  Marland Kitchen buPROPion (WELLBUTRIN) 75 MG tablet Take 2 tablets (150 mg total) by mouth every morning AND 1 tablet (75 mg total) daily after lunch.  . estradiol (ESTRACE) 1 MG tablet Take 1 mg by mouth daily.  . fluticasone (FLONASE) 50 MCG/ACT nasal spray Place into both nostrils daily.  Marland Kitchen LEVOCETIRIZINE DIHYDROCHLORIDE PO Take by mouth.  Marland Kitchen LINZESS 145 MCG CAPS capsule Take 145 mcg by mouth daily.  Marland Kitchen liothyronine (CYTOMEL) 5 MCG tablet Take 25 mcg by mouth daily.   Marland Kitchen lisdexamfetamine (VYVANSE) 30 MG capsule Take 1 capsule (30 mg total) by mouth daily.  Marland Kitchen lisdexamfetamine (VYVANSE) 30 MG capsule Take 1 capsule (30 mg total) by mouth daily.  Marland Kitchen lisdexamfetamine (VYVANSE) 30 MG capsule Take 1 capsule (30 mg total) by mouth daily.  . meloxicam (MOBIC) 15 MG tablet Take 1 tablet (15 mg total) by mouth daily.  Marland Kitchen MICROGESTIN FE 1/20 1-20 MG-MCG tablet   .  montelukast (SINGULAIR) 10 MG tablet Take 10 mg by mouth at bedtime.  . naratriptan (AMERGE) 2.5 MG tablet Take 1 tablet (2.5 mg total) by mouth as needed for migraine. Take one (1) tablet at onset of headache; if returns or does not resolve, may repeat after 4 hours; do not exceed five (5) mg in 24 hours.  . norethindrone-ethinyl estradiol-iron (LOESTRIN FE) 1.5-30 MG-MCG tablet Take 1 tablet by mouth daily.  . SUMAtriptan  (IMITREX) 100 MG tablet TK 1 T PO AOS OF HEADACHE. REPEAT IN 2 H AS NEEDED. MAX DOSE 2 T IN 24 H  . traZODone (DESYREL) 150 MG tablet TAKE 1/2 TABLET(75 MG) BY MOUTH AT BEDTIME   No facility-administered medications prior to visit.    Review of Systems  Constitutional: Negative for chills, fatigue and fever.  HENT: Positive for ear pain, hearing loss, postnasal drip, sinus pressure and sinus pain. Negative for ear discharge, rhinorrhea and sore throat.   Respiratory: Positive for cough. Negative for chest tightness, shortness of breath, wheezing and stridor.   Cardiovascular: Negative for chest pain, palpitations and leg swelling.  Gastrointestinal: Negative for abdominal pain and nausea.  Neurological: Positive for headaches ("sinus headache"). Negative for dizziness.    Last CBC Lab Results  Component Value Date   WBC 4.9 11/14/2019   HGB 13.3 11/14/2019   HCT 39.1 11/14/2019   MCV 102 (H) 11/14/2019   MCH 34.5 (H) 11/14/2019   RDW 11.5 (L) 11/14/2019   PLT 252 44/02/4741   Last metabolic panel Lab Results  Component Value Date   GLUCOSE 94 11/14/2019   NA 139 11/14/2019   K 4.5 11/14/2019   CL 102 11/14/2019   CO2 24 11/14/2019   BUN 18 11/14/2019   CREATININE 1.06 (H) 11/14/2019   GFRNONAA 64 11/14/2019   GFRAA 73 11/14/2019   CALCIUM 9.0 11/14/2019   PHOS 2.9 02/19/2016   PROT 6.8 11/14/2019   ALBUMIN 4.3 11/14/2019   LABGLOB 2.5 11/14/2019   AGRATIO 1.7 11/14/2019   BILITOT 0.3 11/14/2019   ALKPHOS 43 11/14/2019   AST 19 11/14/2019   ALT 19 11/14/2019   ANIONGAP 9 06/13/2017      Objective    BP 109/77 (BP Location: Left Arm, Patient Position: Sitting, Cuff Size: Normal)   Pulse 93   Temp (!) 97 F (36.1 C) (Temporal)   Resp 16   Wt 154 lb (69.9 kg)   BMI 24.12 kg/m  BP Readings from Last 3 Encounters:  05/22/20 109/77  05/13/20 110/71  02/13/20 122/78   Wt Readings from Last 3 Encounters:  05/22/20 154 lb (69.9 kg)  05/13/20 167 lb (75.8 kg)    02/13/20 152 lb (68.9 kg)      Physical Exam Vitals reviewed.  Constitutional:      General: She is not in acute distress.    Appearance: Normal appearance. She is well-developed and normal weight. She is not ill-appearing or diaphoretic.  HENT:     Head: Normocephalic and atraumatic.     Right Ear: Hearing, tympanic membrane, ear canal and external ear normal.     Left Ear: Hearing, ear canal and external ear normal. Tenderness present. A middle ear effusion is present. No mastoid tenderness. Tympanic membrane is bulging. Tympanic membrane is not scarred, perforated or erythematous.     Nose: Nose normal.     Mouth/Throat:     Pharynx: Uvula midline. No oropharyngeal exudate.  Eyes:     General: No scleral icterus.  Right eye: No discharge.        Left eye: No discharge.     Conjunctiva/sclera: Conjunctivae normal.     Pupils: Pupils are equal, round, and reactive to light.  Neck:     Thyroid: No thyromegaly.     Trachea: No tracheal deviation.  Cardiovascular:     Rate and Rhythm: Normal rate and regular rhythm.     Heart sounds: Normal heart sounds. No murmur heard.  No friction rub. No gallop.   Pulmonary:     Effort: Pulmonary effort is normal. No respiratory distress.     Breath sounds: Normal breath sounds. No stridor. No wheezing or rales.  Musculoskeletal:     Cervical back: Normal range of motion and neck supple.  Lymphadenopathy:     Cervical: No cervical adenopathy.  Skin:    General: Skin is warm and dry.  Neurological:     Mental Status: She is alert.       No results found for any visits on 05/22/20.  Assessment & Plan     1. Non-recurrent acute serous otitis media of left ear Will give doxycycline and ciprodex for left ear pain. May use flonase and sudafed. Call if worsening.  - ciprofloxacin-dexamethasone (CIPRODEX) OTIC suspension; Place 4 drops into the left ear 2 (two) times daily. X 7 days  Dispense: 7.5 mL; Refill: 0 - doxycycline  (VIBRA-TABS) 100 MG tablet; Take 1 tablet (100 mg total) by mouth 2 (two) times daily.  Dispense: 10 tablet; Refill: 0  2. ETD (Eustachian tube dysfunction), left See above medical treatment plan. - ciprofloxacin-dexamethasone (CIPRODEX) OTIC suspension; Place 4 drops into the left ear 2 (two) times daily. X 7 days  Dispense: 7.5 mL; Refill: 0  3. Antibiotic-induced yeast infection Gets yeast infections with antibiotics. Diflucan given as below.  - fluconazole (DIFLUCAN) 150 MG tablet; Take 1 tablet (150 mg total) by mouth once for 1 dose.  Dispense: 1 tablet; Refill: 0   No follow-ups on file.      Reynolds Bowl, PA-C, have reviewed all documentation for this visit. The documentation on 05/26/20 for the exam, diagnosis, procedures, and orders are all accurate and complete.   Rubye Beach  [facility] [phone] (phone) [phone] (fax)  [place]

## 2020-05-25 ENCOUNTER — Other Ambulatory Visit: Payer: Self-pay | Admitting: Family Medicine

## 2020-05-25 MED ORDER — TRAZODONE HCL 150 MG PO TABS: ORAL_TABLET | ORAL | 1 refills | Status: DC

## 2020-05-25 MED ORDER — MONTELUKAST SODIUM 10 MG PO TABS: 10.0000 mg | ORAL_TABLET | Freq: Every day | ORAL | 1 refills | Status: DC

## 2020-05-25 NOTE — Telephone Encounter (Signed)
[place] faxed refill request for the following medications:  montelukast (SINGULAIR) 10 MG tablet  traZODone (DESYREL) 150 MG tablet   Please advise.

## 2020-05-26 ENCOUNTER — Encounter: Payer: Self-pay | Admitting: Physician Assistant

## 2020-05-29 ENCOUNTER — Ambulatory Visit: Payer: Self-pay | Admitting: Family Medicine

## 2020-05-29 NOTE — Telephone Encounter (Signed)
Patient called to report c/o increasing palpitations and feeling her heart is going to "beat out of her chest" at times .Patient reports symptoms may last approximately 20  Seconds. Patient exercising daily with recent episode of heart racing during exercise which usually does not happen.. Patient reports heart irregularities comes and goes at rest,  with and without activity, "no rhyme or reason it starts".Denies these symptoms at this time. Patient checked pulse with fit bit for 101- 104 at rest. Symptoms not noted daily per patient. patient reports symptoms started approximately 1-2 years ago.Patient denies chest pain, SOB, dizziness. Patient reports she was talking to her mother about symptoms and patient's mother verbalized her cardiac symptoms started at a similar age. Patient reports she wanted to get checked out by PCP. Care advise given per protocol. Encouraged to go to ED if symptoms worsen. Appt. Made with PCP for 06/01/20. Patient verbalized understanding of care advise.    Reason for Disposition . [1] Palpitations AND [2] no improvement after using CARE ADVICE  Answer Assessment - Initial Assessment Questions 1. DESCRIPTION: "Please describe your heart rate or heart beat that you are having" (e.g., fast/slow, regular/irregular, skipped or extra beats, "palpitations")     irregular fast and slow at times ,skips beats ,chest feels like" it will beat out of my chest" 2. ONSET: "When did it start?" (Minutes, hours or days)      Over 1-2 years ago progressively getting worse 3. DURATION: "How long does it last" (e.g., seconds, minutes, hours)      approximately 20 seconds 4. PATTERN "Does it come and go, or has it been constant since it started?"  "Does it get worse with exertion?"   "Are you feeling it now?"     comes and goes,not feeling bad now , patient exercises daily reports not with exertion. "no rhyme or reason it starts" 5. TAP: "Using your hand, can you tap out what you are feeling  on a chair or table in front of you, so that I can hear?" (Note: not all patients can do this)       n/a 6. HEART RATE: "Can you tell me your heart rate?" "How many beats in 15 seconds?"  (Note: not all patients can do this)       101 at rest sitting in chair now  Then increased to 104 denies palpitations 7. RECURRENT SYMPTOM: "Have you ever had this before?" If so, ask: "When was the last time?" and "What happened that time?"      Yes . Comes and goes not daily 8. CAUSE: "What do you think is causing the palpitations?"     Hereditary, mother hx pvc , svt 9. CARDIAC HISTORY: "Do you have any history of heart disease?" (e.g., heart attack, angina, bypass surgery, angioplasty, arrhythmia)      Not that she is aware 10. OTHER SYMPTOMS: "Do you have any other symptoms?" (e.g., dizziness, chest pain, sweating, difficulty breathing)       no 11. PREGNANCY: "Is there any chance you are pregnant?" "When was your last menstrual period?"       No, 1 week ago LMP  Protocols used: [place]

## 2020-06-01 ENCOUNTER — Ambulatory Visit: Payer: Self-pay | Admitting: Family Medicine

## 2020-06-03 ENCOUNTER — Encounter: Payer: Self-pay | Admitting: Family Medicine

## 2020-06-03 DIAGNOSIS — G8929 Other chronic pain: Secondary | ICD-10-CM

## 2020-06-03 DIAGNOSIS — M542 Cervicalgia: Secondary | ICD-10-CM

## 2020-06-05 NOTE — Telephone Encounter (Signed)
OK to place referral

## 2020-06-08 ENCOUNTER — Telehealth: Payer: Self-pay | Admitting: Family Medicine

## 2020-06-08 DIAGNOSIS — F902 Attention-deficit hyperactivity disorder, combined type: Secondary | ICD-10-CM

## 2020-06-08 DIAGNOSIS — M5412 Radiculopathy, cervical region: Secondary | ICD-10-CM

## 2020-06-08 DIAGNOSIS — J329 Chronic sinusitis, unspecified: Secondary | ICD-10-CM

## 2020-06-08 MED ORDER — LISDEXAMFETAMINE DIMESYLATE 40 MG PO CAPS: 40.0000 mg | ORAL_CAPSULE | ORAL | 0 refills | Status: DC

## 2020-06-08 NOTE — Telephone Encounter (Signed)
Sent in increase from 30mg  to 40mg  x 1 month. Needs appt to f/u before next refill

## 2020-06-08 NOTE — Telephone Encounter (Signed)
It appears she was referred 05/13/20 to [facility] for her neck and I don't see any documentation about chronic sinusitis just the Otitis and ETD. Would you like her to make a virtual first?

## 2020-06-08 NOTE — Telephone Encounter (Signed)
Referrals placed for her

## 2020-06-08 NOTE — Telephone Encounter (Signed)
Patient called, left VM to return the call to the office. The requested dosage 40 mg is not on profile. Unsure of when the change was made.

## 2020-06-08 NOTE — Telephone Encounter (Signed)
Pt is requesting referral to Dr Nadeen Landau for recurrent sinusitis. Was supposed to have surgery in May but has not been done yet. She also would like a referral to Dr Cari Caraway (Neurosurgeon) for cervical radiculitis and bone spurs. She now has Medicaid and needs approval from PCP

## 2020-06-08 NOTE — Telephone Encounter (Signed)
Medication Refill - Medication: lisdexamfetamine (VYVANSE) 40 MG capsule (Patient would like new prescription for dose increase to 40 mg sent to pharmacy. Patient would like a callback from nurse once medication has been sent to pharmacy.)   Has the patient contacted their pharmacy? yes (Agent: If no, request that the patient contact the pharmacy for the refill.) (Agent: If yes, when and what did the pharmacy advise?)Contact PCP  Preferred Pharmacy (with phone number or street name):  [id number] [address], [place] Phone:  [phone]  Fax:  [phone]       Agent: Please be advised that RX refills may take up to 3 business days. We ask that you follow-up with your pharmacy.

## 2020-06-08 NOTE — Telephone Encounter (Signed)
Patient advised as directed below. 

## 2020-06-10 DIAGNOSIS — F900 Attention-deficit hyperactivity disorder, predominantly inattentive type: Secondary | ICD-10-CM | POA: Diagnosis not present

## 2020-06-10 DIAGNOSIS — F4310 Post-traumatic stress disorder, unspecified: Secondary | ICD-10-CM | POA: Diagnosis not present

## 2020-06-10 DIAGNOSIS — F332 Major depressive disorder, recurrent severe without psychotic features: Secondary | ICD-10-CM | POA: Diagnosis not present

## 2020-06-18 DIAGNOSIS — F900 Attention-deficit hyperactivity disorder, predominantly inattentive type: Secondary | ICD-10-CM | POA: Diagnosis not present

## 2020-06-18 DIAGNOSIS — F332 Major depressive disorder, recurrent severe without psychotic features: Secondary | ICD-10-CM | POA: Diagnosis not present

## 2020-06-18 DIAGNOSIS — F4310 Post-traumatic stress disorder, unspecified: Secondary | ICD-10-CM | POA: Diagnosis not present

## 2020-06-22 DIAGNOSIS — M5441 Lumbago with sciatica, right side: Secondary | ICD-10-CM | POA: Diagnosis not present

## 2020-06-22 DIAGNOSIS — R292 Abnormal reflex: Secondary | ICD-10-CM | POA: Diagnosis not present

## 2020-06-22 DIAGNOSIS — G8929 Other chronic pain: Secondary | ICD-10-CM | POA: Diagnosis not present

## 2020-06-22 DIAGNOSIS — M5412 Radiculopathy, cervical region: Secondary | ICD-10-CM | POA: Diagnosis not present

## 2020-06-23 ENCOUNTER — Other Ambulatory Visit: Payer: Self-pay | Admitting: Physician Assistant

## 2020-06-23 DIAGNOSIS — R292 Abnormal reflex: Secondary | ICD-10-CM

## 2020-06-23 DIAGNOSIS — G8929 Other chronic pain: Secondary | ICD-10-CM

## 2020-06-23 DIAGNOSIS — M5412 Radiculopathy, cervical region: Secondary | ICD-10-CM

## 2020-07-03 ENCOUNTER — Other Ambulatory Visit: Payer: Self-pay

## 2020-07-03 ENCOUNTER — Ambulatory Visit: Payer: Medicaid Other | Admitting: Adult Health

## 2020-07-03 DIAGNOSIS — F902 Attention-deficit hyperactivity disorder, combined type: Secondary | ICD-10-CM

## 2020-07-03 NOTE — Progress Notes (Signed)
Established patient visit   Patient: [name]   DOB: [birth year]   44 y.o. Female  MRN: [id number] Visit Date: 07/03/2020  Today's healthcare provider: Marcille Buffy, FNP   Chief Complaint  Patient presents with  . ADHD   Subjective    HPI   Follow up for:  Vyvanse refill -     The patient was last seen for this 1 months ago. Changes made at last visit include on 06/08/20 we increased Vyvanse from 30mg  to 40mg  .  She reports excellent compliance with treatment. She feels that condition is Improved. She is not having side effects.    Finishing counseling grad school. She reports the 40 mg of Vyvanse is helping her so much better.   She is actively working out again and resting heart rate around 75.   Patient  denies any fever, body aches,chills, rash, chest pain, shortness of breath, nausea, vomiting, or diarrhea.  Denies dizziness, lightheadedness, pre syncopal or syncopal episodes.   Dr. Cari Caraway - she is sees for cervical spine, and for lumbar sciatica.   Her Mom with SVT and BBB recently.   Patient  denies any fever, body aches,chills, rash, chest pain, shortness of breath, nausea, vomiting, or diarrhea.   -----------------------------------------------------------------------------------------  -----------------------------------------------------------------------------------------  Patient Active Problem List   Diagnosis Date Noted  . Cervical radiculitis 05/13/2020  . Overweight 05/13/2020  . GAD (generalized anxiety disorder) 03/15/2019  . Moderate episode of recurrent major depressive disorder ([place]) 03/15/2019  . Rheumatoid arthritis ([place]) 11/01/2018  . Primary osteoarthritis of both hands 05/11/2018  . Primary osteoarthritis of both feet 05/11/2018  . Raynaud's disease without gangrene 05/11/2018  . Asthma 04/20/2018  . Onychomycosis 04/20/2018  . Family history of rheumatoid arthritis 04/10/2018  . Dry mouth 01/23/2018  .  PTSD (post-traumatic stress disorder) 10/16/2017  . Insomnia 10/16/2017  . Migraine with aura 10/16/2017  . Hypothyroidism   . ADD (attention deficit disorder)   . Uterine leiomyoma 09/22/2017  . 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 History of uterine fibroid 09/22/2017  . Polyarthralgia 12/09/2016   Past Medical History:  Diagnosis Date  . ADD (attention deficit disorder)   . Bone spur    "front of spine" - effects right shoulder (sometimes)  . Depression   . Deviated nasal septum   . Hypothyroidism   . Nondisplaced fracture of fifth left metatarsal bone   . PTSD (post-traumatic stress disorder)   . RA (rheumatoid arthritis) ([place]) 2016  . Rheumatoid arthritis ([place]) 12/09/2016  . Sinusitis    Social History   Tobacco Use  . Smoking status: Never Smoker  . Smokeless tobacco: Never Used  Vaping Use  . Vaping Use: Never used  Substance Use Topics  . Alcohol use: Yes    Alcohol/week: 0.0 standard drinks    Comment: rare- wine   . Drug use: Never   Allergies  Allergen Reactions  . Penicillins Swelling    Pt states caused throat to swell   . Cefdinir Palpitations  . Sulfa Antibiotics Other (See Comments)    Lips tingled       Medications: Outpatient Medications Prior to Visit  Medication Sig Note  . albuterol (PROVENTIL) (2.5 MG/3ML) 0.083% nebulizer solution Take 3 mLs (2.5 mg total) by nebulization every 6 (six) hours as needed for wheezing or shortness of breath.   Marland Kitchen albuterol (PROVENTIL) (2.5 MG/3ML) 0.083% nebulizer solution albuterol sulfate HFA 90 mcg/actuation aerosol inhaler   . albuterol (VENTOLIN HFA) 108 (  90 Base) MCG/ACT inhaler Inhale 1-2 puffs into the lungs every 6 (six) hours as needed for wheezing or shortness of breath.   Marland Kitchen buPROPion (WELLBUTRIN) 75 MG tablet Take 2 tablets (150 mg total) by mouth every morning AND 1 tablet (75 mg total) daily after lunch. 07/03/2020: Patients reports that she is only taking 2 tablets by mouth every morning.   Marland Kitchen  estradiol (ESTRACE) 1 MG tablet Take 1 mg by mouth daily.   . fluticasone (FLONASE) 50 MCG/ACT nasal spray Place into both nostrils daily.   Marland Kitchen LINZESS 145 MCG CAPS capsule Take 145 mcg by mouth daily.   Marland Kitchen lisdexamfetamine (VYVANSE) 40 MG capsule Take 1 capsule (40 mg total) by mouth every morning.   . meloxicam (MOBIC) 15 MG tablet Take 1 tablet (15 mg total) by mouth daily. 07/03/2020: PRN  . methocarbamol (ROBAXIN) 500 MG tablet Take by mouth.   Marland Kitchen MICROGESTIN FE 1/20 1-20 MG-MCG tablet    . naratriptan (AMERGE) 2.5 MG tablet Take 1 tablet (2.5 mg total) by mouth as needed for migraine. Take one (1) tablet at onset of headache; if returns or does not resolve, may repeat after 4 hours; do not exceed five (5) mg in 24 hours.   . norethindrone-ethinyl estradiol-iron (LOESTRIN FE) 1.5-30 MG-MCG tablet Take 1 tablet by mouth daily.   . SUMAtriptan (IMITREX) 100 MG tablet TK 1 T PO AOS OF HEADACHE. REPEAT IN 2 H AS NEEDED. MAX DOSE 2 T IN 24 H   . traZODone (DESYREL) 150 MG tablet TAKE 1/2 TABLET(75 MG) BY MOUTH AT BEDTIME   . montelukast (SINGULAIR) 10 MG tablet Take 1 tablet (10 mg total) by mouth at bedtime. (Patient not taking: Reported on 07/03/2020)   . [DISCONTINUED] ciprofloxacin-dexamethasone (CIPRODEX) OTIC suspension Place 4 drops into the left ear 2 (two) times daily. X 7 days   . [DISCONTINUED] doxycycline (VIBRA-TABS) 100 MG tablet Take 1 tablet (100 mg total) by mouth 2 (two) times daily.   . [DISCONTINUED] LEVOCETIRIZINE DIHYDROCHLORIDE PO Take by mouth.   . [DISCONTINUED] liothyronine (CYTOMEL) 5 MCG tablet Take 25 mcg by mouth daily.     No facility-administered medications prior to visit.    Review of Systems  Constitutional: Negative.   HENT: Negative.   Eyes: Negative.  Negative for visual disturbance.  Respiratory: Negative.   Cardiovascular: Negative.   Gastrointestinal: Negative.   Genitourinary: Negative.   Musculoskeletal: Negative.   Psychiatric/Behavioral: The  patient is nervous/anxious and is hyperactive.       Objective    BP (!) 132/80   Pulse 100   Temp (!) 97.1 F (36.2 C) (Oral)   Resp 16   Wt 148 lb 12.8 oz (67.5 kg)   SpO2 97%   BMI 23.31 kg/m    Physical Exam Constitutional:      General: She is not in acute distress.    Appearance: Normal appearance. She is not ill-appearing, toxic-appearing or diaphoretic.  HENT:     Head: Normocephalic and atraumatic.     Right Ear: External ear normal.     Left Ear: External ear normal.     Mouth/Throat:     Mouth: Mucous membranes are moist.  Eyes:     Pupils: Pupils are equal, round, and reactive to light.  Cardiovascular:     Rate and Rhythm: Normal rate and regular rhythm.     Pulses: Normal pulses.     Heart sounds: Normal heart sounds. No murmur heard.  No friction rub. No gallop.  Pulmonary:     Effort: Pulmonary effort is normal. No respiratory distress.     Breath sounds: Normal breath sounds. No stridor. No wheezing, rhonchi or rales.  Chest:     Chest wall: No tenderness.  Musculoskeletal:        General: Normal range of motion.     Cervical back: Normal range of motion and neck supple.  Lymphadenopathy:     Cervical: No cervical adenopathy.  Skin:    General: Skin is warm.     Capillary Refill: Capillary refill takes less than 2 seconds.  Neurological:     General: No focal deficit present.     Mental Status: She is alert.  Psychiatric:        Mood and Affect: Mood normal.        Behavior: Behavior normal.        Thought Content: Thought content normal.        Judgment: Judgment normal.     No results found for any visits on 07/03/20.  Assessment & Plan     Follow up on 08/13/2020 with Brita Romp Dionne Bucy, MD  Meds ordered this encounter  Medications  . lisdexamfetamine (VYVANSE) 40 MG capsule    Sig: Take 1 capsule (40 mg total) by mouth every morning.    Dispense:  30 capsule    Refill:  0    Not to fil before 30 days from previous script pick  up.    Return in about 3 months (around 10/03/2020), or if symptoms worsen or fail to improve, for at any time for any worsening symptoms, Go to Emergency room/ urgent care if worse.     IWellington Hampshire , FNP, have reviewed all documentation for this visit. The documentation on 07/05/20 for the exam, diagnosis, procedures, and orders are all accurate and complete.    Marcille Buffy, [place] [phone] (phone) [phone] (fax)  [place]

## 2020-07-03 NOTE — Patient Instructions (Signed)
Lisdexamfetamine Oral Capsule What is this medicine? LISDEXAMFETAMINE (lis DEX am fet a meen) is used to treat attention-deficit hyperactivity disorder (ADHD) in adults and children. It is also used to treat binge-eating disorder in adults. Federal law prohibits giving this medicine to any person other than the person for whom it was prescribed. Do not share this medicine with anyone else. This medicine may be used for other purposes; ask your health care provider or pharmacist if you have questions. COMMON BRAND NAME(S): Vyvanse What should I tell my health care provider before I take this medicine? They need to know if you have any of these conditions:  anxiety or panic attacks  circulation problems in fingers and toes  glaucoma  hardening or blockages of the arteries or heart blood vessels  heart disease or a heart defect  high blood pressure  history of a drug or alcohol abuse problem  history of stroke  kidney disease  liver disease  mental illness  seizures  suicidal thoughts, plans, or attempt; a previous suicide attempt by you or a family member  thyroid disease  Tourette's syndrome  an unusual or allergic reaction to lisdexamfetamine, other medicines, foods, dyes, or preservatives  pregnant or trying to get pregnant  breast-feeding How should I use this medicine? Take this medicine by mouth. Follow the directions on the prescription label. Swallow the capsules with a drink of water. You may open capsule and add to a glass of water, then drink right away. Take your doses at regular intervals. Do not take your medicine more often than directed. Do not suddenly stop your medicine. You must gradually reduce the dose or you may feel withdrawal effects. Ask your doctor or health care professional for advice. A special MedGuide will be given to you by the pharmacist with each prescription and refill. Be sure to read this information carefully each time. Talk to your  pediatrician regarding the use of this medicine in children. While this drug may be prescribed for children as young as 6 years of age for selected conditions, precautions do apply. Overdosage: If you think you have taken too much of this medicine contact a poison control center or emergency room at once. NOTE: This medicine is only for you. Do not share this medicine with others. What if I miss a dose? If you miss a dose, take it as soon as you can. If it is almost time for your next dose, take only that dose. Do not take double or extra doses. What may interact with this medicine? Do not take this medicine with any of the following medications:  MAOIs like Carbex, Eldepryl, Marplan, Nardil, and Parnate  other stimulant medicines for attention disorders, weight loss, or to stay awake This medicine may also interact with the following medications:  acetazolamide  ammonium chloride  antacids  ascorbic acid  atomoxetine  caffeine  certain medicines for blood pressure  certain medicines for depression, anxiety, or psychotic disturbances  certain medicines for seizures like carbamazepine, phenobarbital, phenytoin  certain medicines for stomach problems like cimetidine, famotidine, omeprazole, lansoprazole  cold or allergy medicines  green tea  levodopa  linezolid  medicines for sleep during surgery  methenamine  norepinephrine  phenothiazines like chlorpromazine, mesoridazine, prochlorperazine, thioridazine  propoxyphene  sodium acid phosphate  sodium bicarbonate This list may not describe all possible interactions. Give your health care provider a list of all the medicines, herbs, non-prescription drugs, or dietary supplements you use. Also tell them if you smoke, drink alcohol,   or use illegal drugs. Some items may interact with your medicine. What should I watch for while using this medicine? Visit your doctor for regular check ups. This prescription requires  that you follow special procedures with your doctor and pharmacy. You will need to have a new written prescription from your doctor every time you need a refill. This medicine may affect your concentration, or hide signs of tiredness. Until you know how this medicine affects you, do not drive, ride a bicycle, use machinery, or do anything that needs mental alertness. Tell your doctor or health care professional if this medicine loses its effects, or if you feel you need to take more than the prescribed amount. Do not change your dose without talking to your doctor or health care professional. Decreased appetite is a common side effect when starting this medicine. Eating small, frequent meals or snacks can help. Talk to your doctor if you continue to have poor eating habits. Height and weight growth of a child taking this medicine will be monitored closely. Do not take this medicine close to bedtime. It may prevent you from sleeping. If you are going to need surgery, a MRI, CT scan, or other procedure, tell your doctor that you are taking this medicine. You may need to stop taking this medicine before the procedure. Tell your doctor or healthcare professional right away if you notice unexplained wounds on your fingers and toes while taking this medicine. You should also tell your healthcare provider if you experience numbness or pain, changes in the skin color, or sensitivity to temperature in your fingers or toes. What side effects may I notice from receiving this medicine? Side effects that you should report to your doctor or health care professional as soon as possible:  allergic reactions like skin rash, itching or hives, swelling of the face, lips, or tongue  changes in vision  chest pain or chest tightness  confusion, trouble speaking or understanding  fast, irregular heartbeat  fingers or toes feel numb, cool, painful  hallucination, loss of contact with reality  high blood  pressure  males: prolonged or painful erection  seizures  severe headaches  shortness of breath  suicidal thoughts or other mood changes  trouble walking, dizziness, loss of balance or coordination  uncontrollable head, mouth, neck, arm, or leg movements Side effects that usually do not require medical attention (report to your doctor or health care professional if they continue or are bothersome):  anxious  headache  loss of appetite  nausea, vomiting  trouble sleeping  weight loss This list may not describe all possible side effects. Call your doctor for medical advice about side effects. You may report side effects to FDA at 1-800-FDA-1088. Where should I keep my medicine? Keep out of the reach of children. This medicine can be abused. Keep your medicine in a safe place to protect it from theft. Do not share this medicine with anyone. Selling or giving away this medicine is dangerous and against the law. Store at room temperature between 15 and 30 degrees C (59 and 86 degrees F). Protect from light. Keep container tightly closed. Throw away any unused medicine after the expiration date. NOTE: This sheet is a summary. It may not cover all possible information. If you have questions about this medicine, talk to your doctor, pharmacist, or health care provider.  2020 Elsevier/Gold Standard (2014-10-01 19:20:14)  

## 2020-07-05 ENCOUNTER — Encounter: Payer: Self-pay | Admitting: Adult Health

## 2020-07-05 MED ORDER — LISDEXAMFETAMINE DIMESYLATE 40 MG PO CAPS: 40.0000 mg | ORAL_CAPSULE | ORAL | 0 refills | Status: DC

## 2020-07-06 ENCOUNTER — Ambulatory Visit
Admission: RE | Admit: 2020-07-06 | Discharge: 2020-07-06 | Disposition: A | Discharge status: Home / Self Care | Payer: Medicaid Other | Source: Ambulatory Visit | Attending: Physician Assistant | Admitting: Physician Assistant

## 2020-07-06 ENCOUNTER — Other Ambulatory Visit: Payer: Self-pay

## 2020-07-06 ENCOUNTER — Ambulatory Visit
Admission: RE | Admit: 2020-07-06 | Discharge: 2020-07-06 | Disposition: A | Discharge status: Home / Self Care | Payer: Medicaid Other | Source: Ambulatory Visit | Attending: Neurosurgery | Admitting: Neurosurgery

## 2020-07-06 DIAGNOSIS — M5412 Radiculopathy, cervical region: Secondary | ICD-10-CM

## 2020-07-06 DIAGNOSIS — M545 Low back pain: Secondary | ICD-10-CM | POA: Diagnosis not present

## 2020-07-06 DIAGNOSIS — G8929 Other chronic pain: Secondary | ICD-10-CM | POA: Insufficient documentation

## 2020-07-06 DIAGNOSIS — R292 Abnormal reflex: Secondary | ICD-10-CM | POA: Diagnosis not present

## 2020-07-06 DIAGNOSIS — M5441 Lumbago with sciatica, right side: Secondary | ICD-10-CM | POA: Insufficient documentation

## 2020-07-06 DIAGNOSIS — M542 Cervicalgia: Secondary | ICD-10-CM | POA: Diagnosis not present

## 2020-07-09 DIAGNOSIS — G959 Disease of spinal cord, unspecified: Secondary | ICD-10-CM | POA: Diagnosis not present

## 2020-07-09 DIAGNOSIS — M5002 Cervical disc disorder with myelopathy, mid-cervical region, unspecified level: Secondary | ICD-10-CM | POA: Insufficient documentation

## 2020-07-27 ENCOUNTER — Telehealth (INDEPENDENT_AMBULATORY_CARE_PROVIDER_SITE_OTHER): Payer: Medicaid Other | Admitting: Family Medicine

## 2020-07-27 DIAGNOSIS — J4541 Moderate persistent asthma with (acute) exacerbation: Secondary | ICD-10-CM

## 2020-07-27 DIAGNOSIS — B3731 Acute candidiasis of vulva and vagina: Secondary | ICD-10-CM

## 2020-07-27 DIAGNOSIS — J069 Acute upper respiratory infection, unspecified: Secondary | ICD-10-CM

## 2020-07-27 DIAGNOSIS — B373 Candidiasis of vulva and vagina: Secondary | ICD-10-CM | POA: Diagnosis not present

## 2020-07-27 MED ORDER — FLUCONAZOLE 150 MG PO TABS
150.0000 mg | ORAL_TABLET | Freq: Once | ORAL | 0 refills | Status: AC
Start: 2020-07-27 — End: 2020-07-27

## 2020-07-27 MED ORDER — PREDNISONE 10 MG PO TABS: ORAL_TABLET | ORAL | 0 refills | Status: DC

## 2020-07-27 MED ORDER — ALBUTEROL SULFATE (2.5 MG/3ML) 0.083% IN NEBU: 2.5000 mg | INHALATION_SOLUTION | Freq: Four times a day (QID) | RESPIRATORY_TRACT | 1 refills | Status: DC | PRN

## 2020-07-27 NOTE — Progress Notes (Signed)
MyChart Video Visit    Virtual Visit via Video Note   This visit type was conducted due to national recommendations for restrictions regarding the COVID-19 Pandemic (e.g. social distancing) in an effort to limit this patient's exposure and mitigate transmission in our community. This patient is at least at moderate risk for complications without adequate follow up. This format is felt to be most appropriate for this patient at this time. Physical exam was limited by quality of the video and audio technology used for the visit.    Patient location: home Provider location: [facility] Persons involved in the visit: patient, provider   Patient: [name]   DOB: [birth year]   44 y.o. Female  MRN: [id number] Visit Date: 07/27/2020  Today's healthcare provider: Lavon Paganini, MD   Chief Complaint  Patient presents with  . URI   Subjective    URI  This is a recurrent problem. The problem has been gradually worsening. There has been no fever. Associated symptoms include congestion, coughing and wheezing. Pertinent negatives include no ear pain, headaches, plugged ear sensation, sinus pain, sore throat or vomiting. She has tried inhaler use for the symptoms. The treatment provided mild relief.   HPI    URI    Associated symptoms inlclude sinus pain and congestion.  Recent episode started 1 to 4 weeks ago (1.5 weeks).  The problem has been gradually worsening since onset.  The temperature has been with in normal range.  Patient  is drinking plenty of fluids.  Past hisotry is significant for  occational episodes of bronchitis.  Patient is not a smoker.       Last edited by Virginia Crews, MD on 07/27/2020  1:55 PM. (History)      Chronic sinus congestion, but it is worsening.  Postnasal drip, sore throat, hoarse voice Feels similar to previous episodes of bronchitis  Is vaccinated with 1/2 mRNA vaccines (2nd dose this week) Daughter with similar but more  mild symptoms Does have chest tightness and wheezing   Patient Active Problem List   Diagnosis Date Noted  . Cervical radiculitis 05/13/2020  . Overweight 05/13/2020  . GAD (generalized anxiety disorder) 03/15/2019  . Moderate episode of recurrent major depressive disorder ([place]) 03/15/2019  . Rheumatoid arthritis ([place]) 11/01/2018  . Primary osteoarthritis of both hands 05/11/2018  . Primary osteoarthritis of both feet 05/11/2018  . Raynaud's disease without gangrene 05/11/2018  . Asthma 04/20/2018  . Onychomycosis 04/20/2018  . Family history of rheumatoid arthritis 04/10/2018  . Dry mouth 01/23/2018  . PTSD (post-traumatic stress disorder) 10/16/2017  . Insomnia 10/16/2017  . Migraine with aura 10/16/2017  . Hypothyroidism   . ADD (attention deficit disorder)   . Uterine leiomyoma 09/22/2017  . 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 History of uterine fibroid 09/22/2017  . Polyarthralgia 12/09/2016   Past Medical History:  Diagnosis Date  . ADD (attention deficit disorder)   . Bone spur    "front of spine" - effects right shoulder (sometimes)  . Depression   . Deviated nasal septum   . Hypothyroidism   . Nondisplaced fracture of fifth left metatarsal bone   . PTSD (post-traumatic stress disorder)   . RA (rheumatoid arthritis) ([place]) 2016  . Rheumatoid arthritis ([place]) 12/09/2016  . Sinusitis    Social History   Tobacco Use  . Smoking status: Never Smoker  . Smokeless tobacco: Never Used  Vaping Use  . Vaping Use: Never used  Substance Use Topics  .  Alcohol use: Yes    Alcohol/week: 0.0 standard drinks    Comment: rare- wine   . Drug use: Never   Allergies  Allergen Reactions  . Penicillins Swelling    Pt states caused throat to swell   . Cefdinir Palpitations  . Sulfa Antibiotics Other (See Comments)    Lips tingled    Medications: Outpatient Medications Prior to Visit  Medication Sig  . albuterol (PROVENTIL) (2.5 MG/3ML) 0.083% nebulizer solution  Take 3 mLs (2.5 mg total) by nebulization every 6 (six) hours as needed for wheezing or shortness of breath.  Marland Kitchen albuterol (VENTOLIN HFA) 108 (90 Base) MCG/ACT inhaler Inhale 1-2 puffs into the lungs every 6 (six) hours as needed for wheezing or shortness of breath.  Marland Kitchen buPROPion (WELLBUTRIN) 75 MG tablet Take 2 tablets (150 mg total) by mouth every morning AND 1 tablet (75 mg total) daily after lunch.  . estradiol (ESTRACE) 1 MG tablet Take 1 mg by mouth daily.  . fluticasone (FLONASE) 50 MCG/ACT nasal spray Place into both nostrils daily.  Marland Kitchen LINZESS 145 MCG CAPS capsule Take 145 mcg by mouth daily.  Marland Kitchen lisdexamfetamine (VYVANSE) 40 MG capsule Take 1 capsule (40 mg total) by mouth every morning.  . methocarbamol (ROBAXIN) 500 MG tablet Take by mouth.  Marland Kitchen MICROGESTIN FE 1/20 1-20 MG-MCG tablet   . naratriptan (AMERGE) 2.5 MG tablet Take 1 tablet (2.5 mg total) by mouth as needed for migraine. Take one (1) tablet at onset of headache; if returns or does not resolve, may repeat after 4 hours; do not exceed five (5) mg in 24 hours.  . norethindrone-ethinyl estradiol-iron (LOESTRIN FE) 1.5-30 MG-MCG tablet Take 1 tablet by mouth daily.  . SUMAtriptan (IMITREX) 100 MG tablet TK 1 T PO AOS OF HEADACHE. REPEAT IN 2 H AS NEEDED. MAX DOSE 2 T IN 24 H  . traZODone (DESYREL) 150 MG tablet TAKE 1/2 TABLET(75 MG) BY MOUTH AT BEDTIME  . albuterol (PROVENTIL) (2.5 MG/3ML) 0.083% nebulizer solution albuterol sulfate HFA 90 mcg/actuation aerosol inhaler  . meloxicam (MOBIC) 15 MG tablet Take 1 tablet (15 mg total) by mouth daily. (Patient not taking: Reported on 07/27/2020)  . montelukast (SINGULAIR) 10 MG tablet Take 1 tablet (10 mg total) by mouth at bedtime. (Patient not taking: Reported on 07/03/2020)   No facility-administered medications prior to visit.    Review of Systems  Constitutional: Negative.   HENT: Positive for congestion. Negative for ear pain, sinus pain and sore throat.   Respiratory: Positive for  cough, chest tightness, shortness of breath and wheezing. Negative for apnea, choking and stridor.   Gastrointestinal: Negative for vomiting.  Neurological: Negative for dizziness, light-headedness and headaches.      Objective    There were no vitals taken for this visit.   Physical Exam Constitutional:      General: She is not in acute distress.    Appearance: Normal appearance. She is not diaphoretic.  HENT:     Head: Normocephalic and atraumatic.  Eyes:     Conjunctiva/sclera: Conjunctivae normal.  Pulmonary:     Effort: Pulmonary effort is normal. No respiratory distress.  Neurological:     Mental Status: She is alert and oriented to person, place, and time. Mental status is at baseline.  Psychiatric:        Mood and Affect: Mood normal.        Behavior: Behavior normal.        Assessment & Plan     1. Moderate  persistent asthmatic bronchitis with acute exacerbation - given increased wheezing and SOB, will start asthma exacerbation treatment - continue albuterol prn - prednisone burst and taper - return precautions discussed  2. Viral URI with cough - symptoms and exam c/w viral URI  - likely trigger for asthma eacerbaiton - no evidence of strep pharyngitis, CAP, AOM, bacterial sinusitis, or other bacterial infection - concern for possible COVID19 infection - will send for outpatient testing - discussed need to self isolate 10 days from start of symptoms and until fever-free for at least 48 hours - discussed symptomatic management, natural course, and return precautions  3. Yeast vaginitis - fluconazole Rx'd   Return if symptoms worsen or fail to improve.     I discussed the assessment and treatment plan with the patient. The patient was provided an opportunity to ask questions and all were answered. The patient agreed with the plan and demonstrated an understanding of the instructions.   The patient was advised to call back or seek an in-person evaluation  if the symptoms worsen or if the condition fails to improve as anticipated.    I, Lavon Paganini, MD, have reviewed all documentation for this visit. The documentation on 07/27/20 for the exam, diagnosis, procedures, and orders are all accurate and complete.   , Dionne Bucy, MD, MPH [facility]

## 2020-08-03 ENCOUNTER — Other Ambulatory Visit: Payer: Self-pay | Admitting: Family Medicine

## 2020-08-03 DIAGNOSIS — F902 Attention-deficit hyperactivity disorder, combined type: Secondary | ICD-10-CM

## 2020-08-03 MED ORDER — LISDEXAMFETAMINE DIMESYLATE 40 MG PO CAPS: 40.0000 mg | ORAL_CAPSULE | ORAL | 0 refills | Status: DC

## 2020-08-03 NOTE — Telephone Encounter (Signed)
Requested medication (s) are due for refill today: yes  Requested medication (s) are on the active medication list: yes  Last refill:  07/03/2020  Future visit scheduled: yes  Notes to clinic:  This refill cannot be delegated   Requested Prescriptions  Pending Prescriptions Disp Refills   lisdexamfetamine (VYVANSE) 40 MG capsule 30 capsule 0    Sig: Take 1 capsule (40 mg total) by mouth every morning.      Not Delegated - Psychiatry:  Stimulants/ADHD Failed - 08/03/2020  8:50 AM      Failed - This refill cannot be delegated      Failed - Urine Drug Screen completed in last 360 days.      Passed - Valid encounter within last 3 months    Recent Outpatient Visits           1 week ago Moderate persistent asthmatic bronchitis with acute exacerbation   [facility] [place], Dionne Bucy, MD   1 month ago Attention deficit hyperactivity disorder (ADHD), combined type   [facility] [place], Kelby Aline, FNP   2 months ago Non-recurrent acute serous otitis media of left ear   [place], Clearnce Sorrel, [place]   2 months ago Attention deficit hyperactivity disorder (ADHD), combined type   [facility], Dionne Bucy, MD   5 months ago Erroneous encounter - disregard   [facility] [place], Dionne Bucy, MD       Future Appointments             In 1 week Bacigalupo, Dionne Bucy, MD [facility] [place]

## 2020-08-03 NOTE — Telephone Encounter (Signed)
Copied from [place] [id number]. Topic: Quick Communication - Rx Refill/Question >> Aug 03, 2020  8:45 AM Leward Quan A wrote: Medication: lisdexamfetamine (VYVANSE) 40 MG capsule  Patient would like a call when Rx is sent to pharmacy she will be out tomorrow  Has the patient contacted their pharmacy? Yes.   (Agent: If no, request that the patient contact the pharmacy for the refill.) (Agent: If yes, when and what did the pharmacy advise?)  Preferred Pharmacy (with phone number or street name): [id number] [address], [place]  Phone:  [phone] Fax:  [phone]     Agent: Please be advised that RX refills may take up to 3 business days. We ask that you follow-up with your pharmacy.

## 2020-08-05 ENCOUNTER — Encounter: Payer: Self-pay | Admitting: Family Medicine

## 2020-08-05 ENCOUNTER — Ambulatory Visit: Payer: Self-pay | Admitting: Family Medicine

## 2020-08-05 DIAGNOSIS — R457 State of emotional shock and stress, unspecified: Secondary | ICD-10-CM | POA: Diagnosis not present

## 2020-08-05 DIAGNOSIS — R000 Tachycardia, unspecified: Secondary | ICD-10-CM | POA: Diagnosis not present

## 2020-08-05 DIAGNOSIS — I10 Essential (primary) hypertension: Secondary | ICD-10-CM | POA: Diagnosis not present

## 2020-08-05 DIAGNOSIS — R202 Paresthesia of skin: Secondary | ICD-10-CM | POA: Diagnosis not present

## 2020-08-05 DIAGNOSIS — R064 Hyperventilation: Secondary | ICD-10-CM | POA: Diagnosis not present

## 2020-08-05 NOTE — Telephone Encounter (Signed)
Pt reports mother died Friday, service was yesterday. States "Anxiety attack" since 11 am. Reports HR 106 sustained, denies CP, no palpitations. Does reports "A little dizzy at times." Sent MyChart message to practice at 1546. Is requesting "Something to get me calmed down through the night."  MAde aware of [facility], "Not going that route." Advised NT would reach out to on call provider regarding request.   Dr. Caryn Section on call: called at [place]: awaiting call back. Attempted to call Dr. Caryn Section on [place] line, unable to get through, "Call cannot be completed at this time. Attempted another provider, Dr. Rosanna Randy "VM full"  Dr. B  "Call cannot be completed." Was able to leave VM from cell phone. Reported issue to next shift.  Reason for Disposition . Requesting to talk to a counselor (e.g., mental health worker, psychiatrist)  Answer Assessment - Initial Assessment Questions 1. CONCERN: "What happened that made you call today?"     Mothers death, funeral 2. ANXIETY SYMPTOM SCREENING: "Can you describe how you have been feeling?"  (e.g., tense, restless, panicky, anxious, keyed up, trouble sleeping, trouble concentrating)     Anxiety since 11 am 3. ONSET: "How long have you been feeling this way?"     11 am 4. RECURRENT: "Have you felt this way before?"  If Yes, ask: "What happened that time?" "What helped these feelings go away in the past?"     occasionally 5. RISK OF HARM - SUICIDAL IDEATION:  "Do you ever have thoughts of hurting or killing yourself?"  (e.g., yes, no, no but preoccupation with thoughts about death)   - INTENT:  "Do you have thoughts of hurting or killing yourself right NOW?" (e.g., yes, no, N/A)   - PLAN: "Do you have a specific plan for how you would do this?" (e.g., gun, knife, overdose, no plan, N/A)     no 6. RISK OF HARM - HOMICIDAL IDEATION:  "Do you ever have thoughts of hurting or killing someone else?"  (e.g., yes, no, no but preoccupation with  thoughts about death)   - INTENT:  "Do you have thoughts of hurting or killing someone right NOW?" (e.g., yes, no, N/A)   - PLAN: "Do you have a specific plan for how you would do this?" (e.g., gun, knife, no plan, N/A)      *No Answer* 7. FUNCTIONAL IMPAIRMENT: "How have things been going for you overall? Have you had more difficulty than usual doing your normal daily activities?"  (e.g., better, same, worse; self-care, school, work, interactions)     *No Answer* 8. SUPPORT: "Who is with you now?" "Who do you live with?" "Do you have family or friends who you can talk to?"      Family 9. THERAPIST: "Do you have a counselor or therapist? Name?"     no 10. STRESSORS: "Has there been any new stress or recent changes in your life?"       Mothers death 18. CAFFEINE USE: "Do you drink caffeinated beverages, and how much each day?" (e.g., coffee, tea, colas)       no 12. ALCOHOL USE OR SUBSTANCE USE (DRUG USE): "Do you drink alcohol or use any illegal drugs?"       no 13. OTHER SYMPTOMS: "Do you have any other physical symptoms right now?" (e.g., chest pain, palpitations, difficulty breathing, fever)       HR 106, dizziness  Protocols used: ANXIETY AND PANIC ATTACK-A-AH

## 2020-08-06 MED ORDER — CLONAZEPAM 0.5 MG PO TABS: 0.2500 mg | ORAL_TABLET | Freq: Two times a day (BID) | ORAL | 0 refills | Status: DC | PRN

## 2020-08-06 NOTE — Telephone Encounter (Signed)
LMTCB  August 06, 2020 Virginia Crews, MD to [place], Uvaldo Bristle "Mandy"      8:12 AM Sorry to hear.  I hope you are doing alright.  I sent in Klonopin to use twice daily as needed.  Be careful taking it with trazodone as they are both sedating.  May want to hold Vyvanse, which I think you already were, until anxiety settles a bit as well.   Take care, Dr B  This MyChart message has not been read.

## 2020-08-06 NOTE — Telephone Encounter (Signed)
Patient advised as below. Patient verbalizes understanding and is in agreement with treatment plan.  

## 2020-08-07 DIAGNOSIS — F4310 Post-traumatic stress disorder, unspecified: Secondary | ICD-10-CM | POA: Diagnosis not present

## 2020-08-07 DIAGNOSIS — F900 Attention-deficit hyperactivity disorder, predominantly inattentive type: Secondary | ICD-10-CM | POA: Diagnosis not present

## 2020-08-07 DIAGNOSIS — F332 Major depressive disorder, recurrent severe without psychotic features: Secondary | ICD-10-CM | POA: Diagnosis not present

## 2020-08-12 NOTE — Progress Notes (Deleted)
Established patient visit   Patient: [name]   DOB: [birth year]   44 y.o. Female  MRN: [id number] Visit Date: 08/13/2020  Today's healthcare provider: Lavon Paganini, MD   No chief complaint on file.  Subjective    HPI  Follow up for ADD  The patient was last seen for this 3 months ago. Changes made at last visit include start Vyvanse 30mg  daily.  She reports {excellent/good/fair/poor:19665} compliance with treatment. She feels that condition is {improved/worse/unchanged:3041574}. She {is/is not:21021397} having side effects. ***  ----------------------------------------------------------------------------------------- Follow up for Anxiety  The patient was last seen for this 1 weeks ago. Changes made at last visit include start Klonopin .5-1mg  tablet BID prn.  She reports {excellent/good/fair/poor:19665} compliance with treatment. She feels that condition is {improved/worse/unchanged:3041574}. She {is/is not:21021397} having side effects. ***  -----------------------------------------------------------------------------------------    Patient Active Problem List   Diagnosis Date Noted  . Cervical radiculitis 05/13/2020  . Overweight 05/13/2020  . GAD (generalized anxiety disorder) 03/15/2019  . Moderate episode of recurrent major depressive disorder ([place]) 03/15/2019  . Rheumatoid arthritis ([place]) 11/01/2018  . Primary osteoarthritis of both hands 05/11/2018  . Primary osteoarthritis of both feet 05/11/2018  . Raynaud's disease without gangrene 05/11/2018  . Asthma 04/20/2018  . Onychomycosis 04/20/2018  . Family history of rheumatoid arthritis 04/10/2018  . Dry mouth 01/23/2018  . PTSD (post-traumatic stress disorder) 10/16/2017  . Insomnia 10/16/2017  . Migraine with aura 10/16/2017  . Hypothyroidism   . ADD (attention deficit disorder)   . Uterine leiomyoma 09/22/2017  . 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 History of uterine fibroid  09/22/2017  . Polyarthralgia 12/09/2016   Social History   Tobacco Use  . Smoking status: Never Smoker  . Smokeless tobacco: Never Used  Vaping Use  . Vaping Use: Never used  Substance Use Topics  . Alcohol use: Yes    Alcohol/week: 0.0 standard drinks    Comment: rare- wine   . Drug use: Never   Allergies  Allergen Reactions  . Penicillins Swelling    Pt states caused throat to swell   . Cefdinir Palpitations  . Sulfa Antibiotics Other (See Comments)    Lips tingled       Medications: Outpatient Medications Prior to Visit  Medication Sig  . albuterol (PROVENTIL) (2.5 MG/3ML) 0.083% nebulizer solution Take 3 mLs (2.5 mg total) by nebulization every 6 (six) hours as needed for wheezing or shortness of breath.  Marland Kitchen albuterol (VENTOLIN HFA) 108 (90 Base) MCG/ACT inhaler Inhale 1-2 puffs into the lungs every 6 (six) hours as needed for wheezing or shortness of breath.  Marland Kitchen buPROPion (WELLBUTRIN) 75 MG tablet Take 2 tablets (150 mg total) by mouth every morning AND 1 tablet (75 mg total) daily after lunch.  . clonazePAM (KLONOPIN) 0.5 MG tablet Take 0.5-1 tablets (0.25-0.5 mg total) by mouth 2 (two) times daily as needed for anxiety.  Marland Kitchen estradiol (ESTRACE) 1 MG tablet Take 1 mg by mouth daily.  . fluticasone (FLONASE) 50 MCG/ACT nasal spray Place into both nostrils daily.  Marland Kitchen LINZESS 145 MCG CAPS capsule Take 145 mcg by mouth daily.  Marland Kitchen lisdexamfetamine (VYVANSE) 40 MG capsule Take 1 capsule (40 mg total) by mouth every morning.  . methocarbamol (ROBAXIN) 500 MG tablet Take by mouth.  . naratriptan (AMERGE) 2.5 MG tablet Take 1 tablet (2.5 mg total) by mouth as needed for migraine. Take one (1) tablet at onset of headache; if returns or does not resolve, may repeat  after 4 hours; do not exceed five (5) mg in 24 hours.  . norethindrone-ethinyl estradiol-iron (LOESTRIN FE) 1.5-30 MG-MCG tablet Take 1 tablet by mouth daily.  . predniSONE (DELTASONE) 10 MG tablet Take 60mg  PO daily x1d,  then 50mg  daily x1d, then 40mg  daily x1d, then 30mg  daily x1d, then 20mg  daily x1d, then 10mg  daily x1d, then stop  . SUMAtriptan (IMITREX) 100 MG tablet TK 1 T PO AOS OF HEADACHE. REPEAT IN 2 H AS NEEDED. MAX DOSE 2 T IN 24 H  . traZODone (DESYREL) 150 MG tablet TAKE 1/2 TABLET(75 MG) BY MOUTH AT BEDTIME   No facility-administered medications prior to visit.    Review of Systems  {Heme  Chem  Endocrine  Serology  Results Review (optional):[id number]::" "}  Objective    There were no vitals taken for this visit. BP Readings from Last 3 Encounters:  07/03/20 (!) 132/80  05/22/20 109/77  05/13/20 110/71   Wt Readings from Last 3 Encounters:  07/03/20 148 lb 12.8 oz (67.5 kg)  05/22/20 154 lb (69.9 kg)  05/13/20 167 lb (75.8 kg)      Physical Exam  ***  No results found for any visits on 08/13/20.  Assessment & Plan     ***  No follow-ups on file.      {provider attestation***:1}   Lavon Paganini, MD  [facility] [phone] (phone) [phone] (fax)  [place]

## 2020-08-13 ENCOUNTER — Ambulatory Visit: Payer: Medicaid Other | Admitting: Family Medicine

## 2020-08-19 DIAGNOSIS — F900 Attention-deficit hyperactivity disorder, predominantly inattentive type: Secondary | ICD-10-CM | POA: Diagnosis not present

## 2020-08-19 DIAGNOSIS — F332 Major depressive disorder, recurrent severe without psychotic features: Secondary | ICD-10-CM | POA: Diagnosis not present

## 2020-08-19 DIAGNOSIS — F4310 Post-traumatic stress disorder, unspecified: Secondary | ICD-10-CM | POA: Diagnosis not present

## 2020-08-20 ENCOUNTER — Encounter: Payer: Self-pay | Admitting: Dermatology

## 2020-08-20 ENCOUNTER — Telehealth: Payer: Self-pay

## 2020-08-20 NOTE — Telephone Encounter (Signed)
Copied from [place] [id number]. Topic: General - Other >> Aug 20, 2020 12:26 PM Celene Kras wrote: Reason for CRM: Pt called stating that she would like to have lab work done before her appt tomorrow. She states that she received filler in her chin. She states that she now has swollen lymph nodes and is not able to have anything done about them until she has lab work done. Pt is requesting to have this done today. Please advise.

## 2020-08-20 NOTE — Telephone Encounter (Signed)
Unfortunately, message was just received and lab is closed.  Would need more advanced notice in the future.  Can order labs at visit tomorrow as appropriate.

## 2020-08-21 ENCOUNTER — Encounter: Payer: Self-pay | Admitting: Family Medicine

## 2020-08-21 ENCOUNTER — Ambulatory Visit: Payer: Medicaid Other | Admitting: Family Medicine

## 2020-08-21 ENCOUNTER — Other Ambulatory Visit: Payer: Self-pay

## 2020-08-21 VITALS — BP 124/78 | HR 91 | Temp 98.2°F | Resp 16 | Wt 149.6 lb

## 2020-08-21 DIAGNOSIS — F411 Generalized anxiety disorder: Secondary | ICD-10-CM | POA: Diagnosis not present

## 2020-08-21 DIAGNOSIS — E039 Hypothyroidism, unspecified: Secondary | ICD-10-CM

## 2020-08-21 DIAGNOSIS — B373 Candidiasis of vulva and vagina: Secondary | ICD-10-CM | POA: Diagnosis not present

## 2020-08-21 DIAGNOSIS — F902 Attention-deficit hyperactivity disorder, combined type: Secondary | ICD-10-CM

## 2020-08-21 DIAGNOSIS — F331 Major depressive disorder, recurrent, moderate: Secondary | ICD-10-CM

## 2020-08-21 DIAGNOSIS — J329 Chronic sinusitis, unspecified: Secondary | ICD-10-CM

## 2020-08-21 DIAGNOSIS — B3731 Acute candidiasis of vulva and vagina: Secondary | ICD-10-CM

## 2020-08-21 MED ORDER — FLUCONAZOLE 150 MG PO TABS: 150.0000 mg | ORAL_TABLET | Freq: Once | ORAL | 0 refills | Status: AC

## 2020-08-21 MED ORDER — LISDEXAMFETAMINE DIMESYLATE 50 MG PO CAPS
50.0000 mg | ORAL_CAPSULE | Freq: Every day | ORAL | 0 refills | Status: DC
Start: 2020-08-21 — End: 2020-09-29

## 2020-08-21 MED ORDER — DOXYCYCLINE HYCLATE 100 MG PO TABS: 100.0000 mg | ORAL_TABLET | Freq: Two times a day (BID) | ORAL | 0 refills | Status: DC

## 2020-08-21 MED ORDER — PREDNISONE 10 MG (21) PO TBPK
ORAL_TABLET | ORAL | 0 refills | Status: DC
Start: 2020-08-21 — End: 2020-11-14

## 2020-08-21 NOTE — Assessment & Plan Note (Signed)
Previously well controlled Continue Synthroid at current dose  Recheck TSH and adjust Synthroid as indicated   

## 2020-08-21 NOTE — Patient Instructions (Signed)

## 2020-08-21 NOTE — Assessment & Plan Note (Signed)
Chronic with some recent worsening in the setting of recent death of her mother Deviously well controlled on Wellbutrin and we will continue current dose She believes additional symptoms are related to normal grieving process She is well plugged in and knows how to seek help if needed

## 2020-08-21 NOTE — Assessment & Plan Note (Signed)
Chronic with some recent worsening in the setting of recent death of her mother Previously well controlled on Wellbutrin, so we will continue current dose She believes that additional symptoms are related to normal grieving process She is well plugged in in the therapist community and knows where to seek help

## 2020-08-21 NOTE — Progress Notes (Signed)
Established patient visit  I,Sulibeya S Dimas,acting as a scribe for Lavon Paganini, MD.,have documented all relevant documentation on the behalf of Lavon Paganini, MD,as directed by  Lavon Paganini, MD while in the presence of Lavon Paganini, MD.   Patient: [name]   DOB: [birth year]   44 y.o. Female  MRN: [id number] Visit Date: 08/21/2020  Today's healthcare provider: Lavon Paganini, MD   Chief Complaint  Patient presents with  . ADHD  . Anxiety  . Depression  . Sinusitis   Subjective    HPI  Follow up for ADHD  The patient was last seen for this 3 months ago. Changes made at last visit include start Vyvanse 30mg  daily.  She reports excellent compliance with treatment. She feels that condition is Unchanged. She is not having side effects.   -----------------------------------------------------------------------------------------  Depression, Follow-up  She  was last seen for this 3 months ago. Changes made at last visit include continue Wellbitrin 75mg  2 tablets in the morning and 1 in the afternoon.   She reports excellent compliance with treatment. She is not having side effects.   She reports excellent tolerance of treatment. Current symptoms include: anhedonia, depressed mood, difficulty concentrating and fatigue She feels she is Worse since last visit.  Depression screen [facility] 2/9 08/21/2020 05/13/2020 11/14/2019  Decreased Interest 2 0 3  Down, Depressed, Hopeless 2 0 3  PHQ - 2 Score 4 0 6  Altered sleeping 3 1 3   Tired, decreased energy 3 0 3  Change in appetite 3 1 3   Feeling bad or failure about yourself  0 0 2  Trouble concentrating 3 2 3   Moving slowly or fidgety/restless 1 3 0  Suicidal thoughts 0 0 0  PHQ-9 Score 17 7 20   Difficult doing work/chores Somewhat difficult Somewhat difficult Very difficult    -----------------------------------------------------------------------------------------  Anxiety, Follow-up  She  was last seen for anxiety 3 months ago. Changes made at last visit include continue current medications.   She reports excellent compliance with treatment. She reports excellent tolerance of treatment. She is not having side effects.   She feels her anxiety is moderate and Unchanged since last visit. Patient reports anxiety attack a few days ago. Patient's mother passed away, patient had to take care of the service and cleaning her mother's house. Patient called after hours on 08/05/2020 reporting attack with elevated heart rate. Patient reports she ended up passing out and EMS was called to evaluate patient. Dr. Brita Romp sent in Klonopin 1 tablet BID PRN.     Symptoms: No chest pain Yes difficulty concentrating  No dizziness Yes fatigue  Yes feelings of losing control No insomnia  Yes irritable Yes palpitations  Yes panic attacks No racing thoughts  Yes shortness of breath No sweating  No tremors/shakes    GAD-7 Results GAD-7 Generalized Anxiety Disorder Screening Tool 08/21/2020 08/22/2019 05/16/2019  1. Feeling Nervous, Anxious, or on Edge 1 1 2   2. Not Being Able to Stop or Control Worrying 0 1 1  3. Worrying Too Much About Different Things 0 1 1  4. Trouble Relaxing 1 1 1   5. Being So Restless it's Hard To Sit Still 0 1 1  6. Becoming Easily Annoyed or Irritable 1 2 3   7. Feeling Afraid As If Something Awful Might Happen 0 0 1  Total GAD-7 Score 3 7 10   Difficulty At Work, Home, or Getting  Along With Others? Not difficult at all Somewhat difficult Somewhat difficult  PHQ-9 Scores PHQ9 SCORE ONLY 08/21/2020 05/13/2020 11/14/2019  PHQ-9 Total Score 17 7 20     --------------------------------------------------------------------------------------------------- Upper respiratory symptoms She complains of achiness, facial pain, hand/face or pedal edema, purulent nasal discharge and sinus pressure.with no fever, chills, night sweats or weight loss. Onset of symptoms was a few weeks  ago and staying constant.She is drinking plenty of fluids.  Past history is significant for asthma and occasional episodes of bronchitis. Patient is non-smoker. Patient reports hx of chronic sinusitis. Patient was treated prednisone on 07/27/2020 and reports that she did not get better with medication.  Patient reports swelling around her chin and swollen glands. Patient reports she did get derma fillers in her chin and lips.  ---------------------------------------------------------------------------------------------------    Patient Active Problem List   Diagnosis Date Noted  . Cervical radiculitis 05/13/2020  . Overweight 05/13/2020  . GAD (generalized anxiety disorder) 03/15/2019  . Moderate episode of recurrent major depressive disorder ([place]) 03/15/2019  . Rheumatoid arthritis ([place]) 11/01/2018  . Primary osteoarthritis of both hands 05/11/2018  . Primary osteoarthritis of both feet 05/11/2018  . Raynaud's disease without gangrene 05/11/2018  . Asthma 04/20/2018  . Onychomycosis 04/20/2018  . Family history of rheumatoid arthritis 04/10/2018  . Dry mouth 01/23/2018  . PTSD (post-traumatic stress disorder) 10/16/2017  . Insomnia 10/16/2017  . Migraine with aura 10/16/2017  . Hypothyroidism   . ADD (attention deficit disorder)   . Uterine leiomyoma 09/22/2017  . 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 History of uterine fibroid 09/22/2017  . Polyarthralgia 12/09/2016   Social History   Tobacco Use  . Smoking status: Never Smoker  . Smokeless tobacco: Never Used  Vaping Use  . Vaping Use: Never used  Substance Use Topics  . Alcohol use: Yes    Alcohol/week: 0.0 standard drinks    Comment: rare- wine   . Drug use: Never   Allergies  Allergen Reactions  . Penicillins Swelling    Pt states caused throat to swell   . Sulfa Antibiotics Other (See Comments)    Lips tingled       Medications: Outpatient Medications Prior to Visit  Medication Sig  . albuterol  (PROVENTIL) (2.5 MG/3ML) 0.083% nebulizer solution Take 3 mLs (2.5 mg total) by nebulization every 6 (six) hours as needed for wheezing or shortness of breath.  Marland Kitchen albuterol (VENTOLIN HFA) 108 (90 Base) MCG/ACT inhaler Inhale 1-2 puffs into the lungs every 6 (six) hours as needed for wheezing or shortness of breath.  Marland Kitchen buPROPion (WELLBUTRIN) 75 MG tablet Take 2 tablets (150 mg total) by mouth every morning AND 1 tablet (75 mg total) daily after lunch.  . clonazePAM (KLONOPIN) 0.5 MG tablet Take 0.5-1 tablets (0.25-0.5 mg total) by mouth 2 (two) times daily as needed for anxiety.  Marland Kitchen estradiol (ESTRACE) 1 MG tablet Take 1 mg by mouth daily.  . fluticasone (FLONASE) 50 MCG/ACT nasal spray Place into both nostrils daily.  Marland Kitchen LINZESS 145 MCG CAPS capsule Take 145 mcg by mouth daily.  Marland Kitchen lisdexamfetamine (VYVANSE) 40 MG capsule Take 1 capsule (40 mg total) by mouth every morning.  . methocarbamol (ROBAXIN) 500 MG tablet Take by mouth.  . naratriptan (AMERGE) 2.5 MG tablet Take 1 tablet (2.5 mg total) by mouth as needed for migraine. Take one (1) tablet at onset of headache; if returns or does not resolve, may repeat after 4 hours; do not exceed five (5) mg in 24 hours.  . norethindrone-ethinyl estradiol-iron (LOESTRIN FE) 1.5-30 MG-MCG tablet Take 1 tablet  by mouth daily.  . SUMAtriptan (IMITREX) 100 MG tablet TK 1 T PO AOS OF HEADACHE. REPEAT IN 2 H AS NEEDED. MAX DOSE 2 T IN 24 H  . traZODone (DESYREL) 150 MG tablet TAKE 1/2 TABLET(75 MG) BY MOUTH AT BEDTIME  . [DISCONTINUED] predniSONE (DELTASONE) 10 MG tablet Take 60mg  PO daily x1d, then 50mg  daily x1d, then 40mg  daily x1d, then 30mg  daily x1d, then 20mg  daily x1d, then 10mg  daily x1d, then stop   No facility-administered medications prior to visit.    Review of Systems  Constitutional: Positive for activity change, appetite change and fatigue.  HENT: Positive for congestion, facial swelling, sinus pressure and sinus pain.   Eyes: Positive for  discharge.  Respiratory: Positive for chest tightness and shortness of breath.   Cardiovascular: Positive for chest pain and palpitations.  Musculoskeletal: Positive for myalgias.  Psychiatric/Behavioral: Positive for agitation, decreased concentration and sleep disturbance. The patient is nervous/anxious and is hyperactive.     Last CBC Lab Results  Component Value Date   WBC 4.9 11/14/2019   HGB 13.3 11/14/2019   HCT 39.1 11/14/2019   MCV 102 (H) 11/14/2019   MCH 34.5 (H) 11/14/2019   RDW 11.5 (L) 11/14/2019   PLT 252 60/73/7106   Last metabolic panel Lab Results  Component Value Date   GLUCOSE 94 11/14/2019   NA 139 11/14/2019   K 4.5 11/14/2019   CL 102 11/14/2019   CO2 24 11/14/2019   BUN 18 11/14/2019   CREATININE 1.06 (H) 11/14/2019   GFRNONAA 64 11/14/2019   GFRAA 73 11/14/2019   CALCIUM 9.0 11/14/2019   PHOS 2.9 02/19/2016   PROT 6.8 11/14/2019   ALBUMIN 4.3 11/14/2019   LABGLOB 2.5 11/14/2019   AGRATIO 1.7 11/14/2019   BILITOT 0.3 11/14/2019   ALKPHOS 43 11/14/2019   AST 19 11/14/2019   ALT 19 11/14/2019   ANIONGAP 9 06/13/2017   Last lipids Lab Results  Component Value Date   CHOL 214 (H) 11/14/2019   HDL 80 11/14/2019   LDLCALC 124 (H) 11/14/2019   TRIG 55 11/14/2019   CHOLHDL 2.7 11/14/2019   Last hemoglobin A1c No results found for: HGBA1C Last thyroid functions Lab Results  Component Value Date   TSH 1.840 11/14/2019   T4TOTAL 11.4 02/19/2016   Last vitamin D Lab Results  Component Value Date   VD25OH 40.5 02/19/2016   Last vitamin B12 and Folate Lab Results  Component Value Date   VITAMINB12 734 11/14/2019   FOLATE >20.0 05/18/2017      Objective    BP 124/78 (BP Location: Left Arm, Patient Position: Sitting, Cuff Size: Normal)   Pulse 91   Temp 98.2 F (36.8 C) (Oral)   Resp 16   Wt 149 lb 9.6 oz (67.9 kg)   BMI 23.43 kg/m  BP Readings from Last 3 Encounters:  08/21/20 124/78  07/03/20 (!) 132/80  05/22/20 109/77    Wt Readings from Last 3 Encounters:  08/21/20 149 lb 9.6 oz (67.9 kg)  07/03/20 148 lb 12.8 oz (67.5 kg)  05/22/20 154 lb (69.9 kg)      Physical Exam Vitals reviewed.  Constitutional:      General: She is not in acute distress.    Appearance: Normal appearance. She is well-developed and normal weight. She is not diaphoretic.  HENT:     Head: Normocephalic and atraumatic.  Eyes:     General: No scleral icterus.    Conjunctiva/sclera: Conjunctivae normal.  Neck:  Thyroid: No thyromegaly.  Cardiovascular:     Rate and Rhythm: Normal rate and regular rhythm.     Pulses: Normal pulses.     Heart sounds: Normal heart sounds. No murmur heard.   Pulmonary:     Effort: Pulmonary effort is normal. No respiratory distress.     Breath sounds: Normal breath sounds. No wheezing, rhonchi or rales.  Musculoskeletal:     Cervical back: Neck supple. No tenderness.     Right lower leg: No edema.     Left lower leg: No edema.  Lymphadenopathy:     Cervical: No cervical adenopathy.  Skin:    General: Skin is warm and dry.     Findings: Erythema and rash present.  Neurological:     Mental Status: She is alert and oriented to person, place, and time. Mental status is at baseline.  Psychiatric:        Mood and Affect: Mood normal.        Behavior: Behavior normal.     No results found for any visits on 08/21/20.  Assessment & Plan     Problem List Items Addressed This Visit      Endocrine   Hypothyroidism   Relevant Orders   TSH     Other   ADD (attention deficit disorder) - Primary   GAD (generalized anxiety disorder)   Moderate episode of recurrent major depressive disorder (HCC)    Other Visit Diagnoses    Chronic sinusitis, unspecified location       Relevant Medications   doxycycline (VIBRA-TABS) 100 MG tablet   fluconazole (DIFLUCAN) 150 MG tablet   predniSONE (STERAPRED UNI-PAK 21 TAB) 10 MG (21) TBPK tablet   Yeast vaginitis       Relevant Medications    fluconazole (DIFLUCAN) 150 MG tablet    - symptoms and exam c/w sinusitis   - no evidence of AOM, CAP, strep pharyngitis, or other infection - given duration of symptoms, suspect bacterial etiology - symptoms for >10 days, so out of infectious window for COVID19 - will treat with doxycycline x7d - discussed symptomatic management (flonase, decongestants, etc), natural course, and return precautions     Return in about 3 months (around 11/20/2020) for CPE.      I, Lavon Paganini, MD, have reviewed all documentation for this visit. The documentation on 08/21/20 for the exam, diagnosis, procedures, and orders are all accurate and complete.   , Dionne Bucy, MD, MPH [facility]

## 2020-08-21 NOTE — Telephone Encounter (Signed)
Patient seen today

## 2020-08-21 NOTE — Assessment & Plan Note (Signed)
Some improvement with Vyvanse, but not feeling full effect at this dose Increased to 50 mg daily When things settle down her life, it is possible that we may be able to back back down on the dose

## 2020-08-22 LAB — TSH: TSH: 2.18 u[IU]/mL (ref 0.450–4.500)

## 2020-08-24 ENCOUNTER — Telehealth: Payer: Self-pay

## 2020-08-24 NOTE — Telephone Encounter (Signed)
Pt advised.   Thanks,   -  

## 2020-08-24 NOTE — Telephone Encounter (Signed)
-----   Message from Virginia Crews, MD sent at 08/24/2020  8:17 AM EDT ----- Normal labs

## 2020-08-27 DIAGNOSIS — G959 Disease of spinal cord, unspecified: Secondary | ICD-10-CM | POA: Diagnosis not present

## 2020-08-27 DIAGNOSIS — Z01818 Encounter for other preprocedural examination: Secondary | ICD-10-CM | POA: Diagnosis not present

## 2020-09-02 DIAGNOSIS — F332 Major depressive disorder, recurrent severe without psychotic features: Secondary | ICD-10-CM | POA: Diagnosis not present

## 2020-09-02 DIAGNOSIS — F4310 Post-traumatic stress disorder, unspecified: Secondary | ICD-10-CM | POA: Diagnosis not present

## 2020-09-02 DIAGNOSIS — F900 Attention-deficit hyperactivity disorder, predominantly inattentive type: Secondary | ICD-10-CM | POA: Diagnosis not present

## 2020-09-28 ENCOUNTER — Other Ambulatory Visit: Payer: Self-pay

## 2020-09-28 ENCOUNTER — Telehealth (INDEPENDENT_AMBULATORY_CARE_PROVIDER_SITE_OTHER): Payer: Medicaid Other | Admitting: Adult Health

## 2020-09-28 ENCOUNTER — Encounter: Payer: Self-pay | Admitting: Family Medicine

## 2020-09-28 ENCOUNTER — Telehealth: Payer: Self-pay | Admitting: Family Medicine

## 2020-09-28 ENCOUNTER — Encounter: Payer: Self-pay | Admitting: Adult Health

## 2020-09-28 DIAGNOSIS — J342 Deviated nasal septum: Secondary | ICD-10-CM

## 2020-09-28 DIAGNOSIS — H6502 Acute serous otitis media, left ear: Secondary | ICD-10-CM | POA: Insufficient documentation

## 2020-09-28 DIAGNOSIS — Z9889 Other specified postprocedural states: Secondary | ICD-10-CM

## 2020-09-28 DIAGNOSIS — J329 Chronic sinusitis, unspecified: Secondary | ICD-10-CM

## 2020-09-28 HISTORY — DX: Other specified postprocedural states: Z98.890

## 2020-09-28 MED ORDER — FLUCONAZOLE 150 MG PO TABS: 150.0000 mg | ORAL_TABLET | Freq: Once | ORAL | 0 refills | Status: AC

## 2020-09-28 MED ORDER — DOXYCYCLINE HYCLATE 100 MG PO TABS: 100.0000 mg | ORAL_TABLET | Freq: Two times a day (BID) | ORAL | 0 refills | Status: DC

## 2020-09-28 NOTE — Progress Notes (Addendum)
Virtual Visit via Video Note  I connected with [name] on 09/28/20 at 10:20 AM EDT by a video enabled telemedicine application and verified that I am speaking with the correct person using two identifiers.  Location: Patient: at home  Provider: Provider: Provider's office at  [facility], [place].      I discussed the limitations of evaluation and management by telemedicine and the availability of in person appointments. The patient expressed understanding and agreed to proceed.  History of Present Illness:     [name] is a 44 y.o. female who presents for evaluation of sinus pain. Symptoms include: congestion, fevers, headaches, nasal congestion and post nasal drip. Onset of symptoms was 10 day ago. Symptoms have been gradually worsening since that time. Past history is significant for sinus surgery and repeat sinusitis . Patient is a non-smoker.  The following portions of the patient's history were reviewed and updated as appropriate: past medical history, past social history and problem list.  No LMP recorded. Patient is perimenopausal. denies any chance of pregnancy.   Patient  denies any fever, body aches,chills, rash, chest pain, shortness of breath, nausea, vomiting, or diarrhea. Denies dizziness, lightheadedness, pre syncopal or syncopal episodes.   She requests Diflucan due to antibiotic induced yeast infections and is advised only to take if needed. Review of Systems A comprehensive review of systems was negative except for: Ears, nose, mouth, throat, and face: positive for nasal congestion, sore throat and maxillary sinus pressure worse on left than right with green/ yellow nasal discharge.  Respiratory: positive for post nasal drip cough per patient. Denies any known exposures. Will be covid tested for he spinal surgery upcoming with Dr. Cari Caraway in less than a week.    Objective:    Patient is alert and oriented and responsive to  questions Engages in conversation with provider. Speaks in full sentences without any pauses without any shortness of breath or distress.     Assessment:    Acute bacterial sinusitis.    Plan:    Nasal saline sprays. Referred to ENT. Do advise follow up if symptoms persist with her EBT given her complex sinus history with past surgeries, repeat infection.  Follow up in 2 week or as needed.  Do also recommend quarantine, testing for Covid and flu given timeframe and pandemic. Will treat since been over 10 days.     Meds ordered this encounter  Medications  . doxycycline (VIBRA-TABS) 100 MG tablet    Sig: Take 1 tablet (100 mg total) by mouth 2 (two) times daily.    Dispense:  20 tablet    Refill:  0  . fluconazole (DIFLUCAN) 150 MG tablet    Sig: Take 1 tablet (150 mg total) by mouth once for 1 dose.    Dispense:  1 tablet    Refill:  0  Diflucan sent x 1 dose follow up if worsening or persisting. Only take Diflucan if symptoms of worsening  Symptoms of sinusitis or if any antibiotic induced yeast infection occurs.  Advise Dr. Cari Caraway of changes to medications prior to surgery. Advised ENT follow up if not resolved.   Recommend flu ad covid testing/ Declines she says she is scheduled for Covid testing pres surgical on 09/30/20.  Follow Up Instructions:    I discussed the assessment and treatment plan with the patient. The patient was provided an opportunity to ask questions and all were answered. The patient agreed with the plan and demonstrated an understanding  of the instructions.   The patient was advised to call back or seek an in-person evaluation if the symptoms worsen or if the condition fails to improve as anticipated.  I provided 20  minutes of non-face-to-face time during this encounter.   Marcille Buffy, FNP

## 2020-09-28 NOTE — Patient Instructions (Signed)
Do advise seeing ENT if worsening or -persistent.     Doxycycline tablets or capsules What is this medicine? DOXYCYCLINE (dox i SYE kleen) is a tetracycline antibiotic. It kills certain bacteria or stops their growth. It is used to treat many kinds of infections, like dental, skin, respiratory, and urinary tract infections. It also treats acne, Lyme disease, malaria, and certain sexually transmitted infections. This medicine may be used for other purposes; ask your health care provider or pharmacist if you have questions. COMMON BRAND NAME(S): Acticlate, Adoxa, Adoxa CK, Adoxa Pak, Adoxa TT, Alodox, Avidoxy, Doxal, LYMEPAK, Mondoxyne NL, Monodox, Morgidox 1x, Morgidox 1x Kit, Morgidox 2x, Morgidox 2x Kit, NutriDox, Ocudox, [place], [place], Vibra-Tabs, Vibramycin What should I tell my health care provider before I take this medicine? They need to know if you have any of these conditions:  liver disease  long exposure to sunlight like working outdoors  stomach problems like colitis  an unusual or allergic reaction to doxycycline, tetracycline antibiotics, other medicines, foods, dyes, or preservatives  pregnant or trying to get pregnant  breast-feeding How should I use this medicine? Take this medicine by mouth with a full glass of water. Follow the directions on the prescription label. It is best to take this medicine without food, but if it upsets your stomach take it with food. Take your medicine at regular intervals. Do not take your medicine more often than directed. Take all of your medicine as directed even if you think you are better. Do not skip doses or stop your medicine early. Talk to your pediatrician regarding the use of this medicine in children. While this drug may be prescribed for selected conditions, precautions do apply. Overdosage: If you think you have taken too much of this medicine contact a poison control center or emergency room at once. NOTE: This medicine is  only for you. Do not share this medicine with others. What if I miss a dose? If you miss a dose, take it as soon as you can. If it is almost time for your next dose, take only that dose. Do not take double or extra doses. What may interact with this medicine?  antacids  barbiturates  birth control pills  bismuth subsalicylate  carbamazepine  methoxyflurane  other antibiotics  phenytoin  vitamins that contain iron  warfarin This list may not describe all possible interactions. Give your health care provider a list of all the medicines, herbs, non-prescription drugs, or dietary supplements you use. Also tell them if you smoke, drink alcohol, or use illegal drugs. Some items may interact with your medicine. What should I watch for while using this medicine? Tell your doctor or health care professional if your symptoms do not improve. Do not treat diarrhea with over the counter products. Contact your doctor if you have diarrhea that lasts more than 2 days or if it is severe and watery. Do not take this medicine just before going to bed. It may not dissolve properly when you lay down and can cause pain in your throat. Drink plenty of fluids while taking this medicine to also help reduce irritation in your throat. This medicine can make you more sensitive to the sun. Keep out of the sun. If you cannot avoid being in the sun, wear protective clothing and use sunscreen. Do not use sun lamps or tanning beds/booths. Birth control pills may not work properly while you are taking this medicine. Talk to your doctor about using an extra method of birth control. If you  are being treated for a sexually transmitted infection, avoid sexual contact until you have finished your treatment. Your sexual partner may also need treatment. Avoid antacids, aluminum, calcium, magnesium, and iron products for 4 hours before and 2 hours after taking a dose of this medicine. If you are using this medicine to  prevent malaria, you should still protect yourself from contact with mosquitos. Stay in screened-in areas, use mosquito nets, keep your body covered, and use an insect repellent. What side effects may I notice from receiving this medicine? Side effects that you should report to your doctor or health care professional as soon as possible:  allergic reactions like skin rash, itching or hives, swelling of the face, lips, or tongue  difficulty breathing  fever  itching in the rectal or genital area  pain on swallowing  rash, fever, and swollen lymph nodes  redness, blistering, peeling or loosening of the skin, including inside the mouth  severe stomach pain or cramps  unusual bleeding or bruising  unusually weak or tired  yellowing of the eyes or skin Side effects that usually do not require medical attention (report to your doctor or health care professional if they continue or are bothersome):  diarrhea  loss of appetite  nausea, vomiting This list may not describe all possible side effects. Call your doctor for medical advice about side effects. You may report side effects to FDA at 1-800-FDA-1088. Where should I keep my medicine? Keep out of the reach of children. Store at room temperature, below 30 degrees C (86 degrees F). Protect from light. Keep container tightly closed. Throw away any unused medicine after the expiration date. Taking this medicine after the expiration date can make you seriously ill. NOTE: This sheet is a summary. It may not cover all possible information. If you have questions about this medicine, talk to your doctor, pharmacist, or health care provider.  2020 Elsevier/Gold Standard (2019-02-28 13:44:53) Sinusitis, Adult Sinusitis is soreness and swelling (inflammation) of your sinuses. Sinuses are hollow spaces in the bones around your face. They are located:  Around your eyes.  In the middle of your forehead.  Behind your nose.  In your  cheekbones. Your sinuses and nasal passages are lined with a fluid called mucus. Mucus drains out of your sinuses. Swelling can trap mucus in your sinuses. This lets germs (bacteria, virus, or fungus) grow, which leads to infection. Most of the time, this condition is caused by a virus. What are the causes? This condition is caused by:  Allergies.  Asthma.  Germs.  Things that block your nose or sinuses.  Growths in the nose (nasal polyps).  Chemicals or irritants in the air.  Fungus (rare). What increases the risk? You are more likely to develop this condition if:  You have a weak body defense system (immune system).  You do a lot of swimming or diving.  You use nasal sprays too much.  You smoke. What are the signs or symptoms? The main symptoms of this condition are pain and a feeling of pressure around the sinuses. Other symptoms include:  Stuffy nose (congestion).  Runny nose (drainage).  Swelling and warmth in the sinuses.  Headache.  Toothache.  A cough that may get worse at night.  Mucus that collects in the throat or the back of the nose (postnasal drip).  Being unable to smell and taste.  Being very tired (fatigue).  A fever.  Sore throat.  Bad breath. How is this diagnosed? This condition is  diagnosed based on:  Your symptoms.  Your medical history.  A physical exam.  Tests to find out if your condition is short-term (acute) or long-term (chronic). Your doctor may: ? Check your nose for growths (polyps). ? Check your sinuses using a tool that has a light (endoscope). ? Check for allergies or germs. ? Do imaging tests, such as an MRI or CT scan. How is this treated? Treatment for this condition depends on the cause and whether it is short-term or long-term.  If caused by a virus, your symptoms should go away on their own within 10 days. You may be given medicines to relieve symptoms. They include: ? Medicines that shrink swollen tissue  in the nose. ? Medicines that treat allergies (antihistamines). ? A spray that treats swelling of the nostrils. ? Rinses that help get rid of thick mucus in your nose (nasal saline washes).  If caused by bacteria, your doctor may wait to see if you will get better without treatment. You may be given antibiotic medicine if you have: ? A very bad infection. ? A weak body defense system.  If caused by growths in the nose, you may need to have surgery. Follow these instructions at home: Medicines  Take, use, or apply over-the-counter and prescription medicines only as told by your doctor. These may include nasal sprays.  If you were prescribed an antibiotic medicine, take it as told by your doctor. Do not stop taking the antibiotic even if you start to feel better. Hydrate and humidify   Drink enough water to keep your pee (urine) pale yellow.  Use a cool mist humidifier to keep the humidity level in your home above 50%.  Breathe in steam for 10-15 minutes, 3-4 times a day, or as told by your doctor. You can do this in the bathroom while a hot shower is running.  Try not to spend time in cool or dry air. Rest  Rest as much as you can.  Sleep with your head raised (elevated).  Make sure you get enough sleep each night. General instructions   Put a warm, moist washcloth on your face 3-4 times a day, or as often as told by your doctor. This will help with discomfort.  Wash your hands often with soap and water. If there is no soap and water, use hand sanitizer.  Do not smoke. Avoid being around people who are smoking (secondhand smoke).  Keep all follow-up visits as told by your doctor. This is important. Contact a doctor if:  You have a fever.  Your symptoms get worse.  Your symptoms do not get better within 10 days. Get help right away if:  You have a very bad headache.  You cannot stop throwing up (vomiting).  You have very bad pain or swelling around your face or  eyes.  You have trouble seeing.  You feel confused.  Your neck is stiff.  You have trouble breathing. Summary  Sinusitis is swelling of your sinuses. Sinuses are hollow spaces in the bones around your face.  This condition is caused by tissues in your nose that become inflamed or swollen. This traps germs. These can lead to infection.  If you were prescribed an antibiotic medicine, take it as told by your doctor. Do not stop taking it even if you start to feel better.  Keep all follow-up visits as told by your doctor. This is important. This information is not intended to replace advice given to you by your health  care provider. Make sure you discuss any questions you have with your health care provider. Document Revised: 04/30/2018 Document Reviewed: 04/30/2018 Elsevier Patient Education  [place].

## 2020-09-28 NOTE — Telephone Encounter (Signed)
PT requesting 3 refill /  lisdexamfetamine (VYVANSE) 50 MG capsule [[id number]]  [facility] #[id number] [address], [address]  [place] [address]  Phone: [phone] Fax: [phone]

## 2020-09-28 NOTE — Telephone Encounter (Signed)
Copied from [place] [id number]. Topic: Quick Communication - Rx Refill/Question >> Sep 28, 2020 10:55 AM Hinda Lenis D wrote: PT requesting 3 refill /  lisdexamfetamine (VYVANSE) 50 MG capsule [[id number]]  [facility] #[id number], [address] [place] [address] Phone: [phone] Fax: [phone]

## 2020-09-29 MED ORDER — LISDEXAMFETAMINE DIMESYLATE 50 MG PO CAPS: 50.0000 mg | ORAL_CAPSULE | Freq: Every day | ORAL | 0 refills | Status: DC

## 2020-09-29 NOTE — Addendum Note (Signed)
Addended by: Ashley Royalty E on: 09/29/2020 08:34 AM   Modules accepted: Orders

## 2020-09-30 DIAGNOSIS — Z03818 Encounter for observation for suspected exposure to other biological agents ruled out: Secondary | ICD-10-CM | POA: Diagnosis not present

## 2020-09-30 DIAGNOSIS — Z01818 Encounter for other preprocedural examination: Secondary | ICD-10-CM | POA: Diagnosis not present

## 2020-10-02 DIAGNOSIS — M4712 Other spondylosis with myelopathy, cervical region: Secondary | ICD-10-CM | POA: Diagnosis not present

## 2020-10-02 HISTORY — PX: ARTHROPLASTY: SHX135

## 2020-10-23 ENCOUNTER — Telehealth (INDEPENDENT_AMBULATORY_CARE_PROVIDER_SITE_OTHER): Payer: Medicaid Other | Admitting: Adult Health

## 2020-10-23 DIAGNOSIS — H109 Unspecified conjunctivitis: Secondary | ICD-10-CM

## 2020-10-23 DIAGNOSIS — H1032 Unspecified acute conjunctivitis, left eye: Secondary | ICD-10-CM

## 2020-10-23 MED ORDER — CIPROFLOXACIN HCL 0.3 % OP SOLN: OPHTHALMIC | 0 refills | Status: AC

## 2020-10-23 NOTE — Progress Notes (Addendum)
Virtual Visit via Video Note  I connected with [name] on 10/26/20 at 11:20 AM EST by a video enabled telemedicine application and verified that I am speaking with the correct person using two identifiers.  Location: Patient: at home  Provider: Provider: Provider's office at  [facility], [place].      I discussed the limitations of evaluation and management by telemedicine and the availability of in person appointments. The patient expressed understanding and agreed to proceed.    I discussed the assessment and treatment plan with the patient. The patient was provided an opportunity to ask questions and all were answered. The patient agreed with the plan and demonstrated an understanding of the instructions.   The patient was advised to call back or seek an in-person evaluation if the symptoms worsen or if the condition fails to improve as anticipated.  I provided 20 minutes of non-face-to-face time during this encounter.   Marcille Buffy, FNP   Subjective:    [name] is a 44 y.o. female who presents for evaluation of discharge, erythema and tearing in the left eye. She has noticed the above symptoms for 2 days. Onset was gradual. Patient denies blurred vision, pain and visual field deficit. There is a history of no known exposures other than daughter has a cold, patinet denies any respiratory symptoms. .  The following portions of the patient's history were reviewed and updated as appropriate: current medications, past family history, past medical history, past social history, past surgical history and problem list.  Review of Systems Pertinent items are noted in HPI.   Objective:      Patient is alert and oriented and responsive to questions Engages in conversation with provider. Speaks in full sentences without any pauses without any shortness of breath or distress.      Assessment:    Acute conjunctivitis and of left eye     Plan:   Meds ordered this encounter  Medications  . ciprofloxacin (CILOXAN) 0.3 % ophthalmic solution    Sig: Place 1 drop into the left eye every 2 (two) hours while awake for 2 days, THEN 1 drop every 4 (four) hours while awake for 5 days.    Dispense:  2.2 mL    Refill:  0     Discussed the diagnosis and proper care of conjunctivitis.  Stressed household Nurse, mental health. Ophthalmic drops per orders. Warm compress to eye(s). Local eye care discussed. FU with PCP in 3 days or PRN.    Red Flags discussed. The patient was given clear instructions to go to ER or return to medical center if any red flags develop, symptoms do not improve, worsen or new problems develop. They verbalized understanding.   I discussed the limitations of evaluation and management by telemedicine and the availability of in person appointments. The patient expressed understanding and agreed to proceed. See above note for consent.

## 2020-10-26 ENCOUNTER — Encounter: Payer: Self-pay | Admitting: Adult Health

## 2020-10-26 DIAGNOSIS — H109 Unspecified conjunctivitis: Secondary | ICD-10-CM | POA: Insufficient documentation

## 2020-10-26 DIAGNOSIS — H1032 Unspecified acute conjunctivitis, left eye: Secondary | ICD-10-CM | POA: Insufficient documentation

## 2020-11-13 ENCOUNTER — Ambulatory Visit: Payer: Self-pay | Admitting: *Deleted

## 2020-11-13 ENCOUNTER — Ambulatory Visit: Payer: Self-pay

## 2020-11-13 NOTE — Telephone Encounter (Signed)
C/o clogged ears since Sunday. Patient recently had covid in the past 16 days. Decreased hearing and pain started in left ear now in right ear. Ringing in ears at times with muffled sounds. Fluid draining from ears "pinkish" fluid. Denies fever,dizziness but reports feeling whoozy when looking up. Headache reported no runny nose. Tried taking zyrtec and dayquil with no relief. encouraged patient to go to UC. No available appt for PCP until Monday and patient reports she can not wait until Monday. Care advise given. Patient verbalized understanding of care advise and to go to [facility] or ED if symptoms worsen.   Reason for Disposition . Earache persists > 1 hour  Answer Assessment - Initial Assessment Questions 1. LOCATION: "Which ear is involved?"      *No Answer* 2. COLOR: "What is the color of the discharge?"      *No Answer* 3. CONSISTENCY: "How runny is the discharge? Could it be water?"      *No Answer* 4. ONSET: "When did you first notice the discharge?"     *No Answer* 5. PAIN: "Is there any earache?" "How bad is it?"  (Scale 1-10; or mild, moderate, severe)     *No Answer* 6. OBJECTS: "Have you put anything in your ear?" (e.g., Q-tip, other object)      *No Answer* 7. OTHER SYMPTOMS: "Do you have any other symptoms?" (e.g., headache, fever, dizziness, vomiting, runny nose)     *No Answer* 8. PREGNANCY: "Is there any chance you are pregnant?" "When was your last menstrual period?"     *No Answer*  Answer Assessment - Initial Assessment Questions 1. LOCATION: "Which ear is involved?"       Bilateral ears  2. SENSATION: "Describe how the ear feels." (e.g. stuffy, full, plugged)."      clogged 3. ONSET:  "When did the ear symptoms start?"       Sunday . Had covid 16 days ago  4. PAIN: "Do you also have an earache?" If Yes, ask: "How bad is it?" (Scale 1-10; or mild, moderate, severe)     Yes moderate 6-7 pain level 5. CAUSE: "What do you think is causing the ear congestion?"     No  sure  6. URI: "Do you have a runny nose or cough?"      no 7. NASAL ALLERGIES: "Are there symptoms of hay fever, such as sneezing or a clear nasal discharge?"     headache 8. PREGNANCY: "Is there any chance you are pregnant?" "When was your last menstrual period?"     na  Protocols used: EAR - [place], EAR - [place]

## 2020-11-14 ENCOUNTER — Ambulatory Visit
Admission: RE | Admit: 2020-11-14 | Discharge: 2020-11-14 | Disposition: A | Discharge status: Home / Self Care | Payer: Medicaid Other | Source: Ambulatory Visit | Attending: Emergency Medicine | Admitting: Emergency Medicine

## 2020-11-14 ENCOUNTER — Other Ambulatory Visit: Payer: Self-pay

## 2020-11-14 VITALS — BP 122/86 | HR 100 | Temp 98.5°F | Resp 14 | Ht 67.0 in | Wt 140.0 lb

## 2020-11-14 DIAGNOSIS — H6983 Other specified disorders of Eustachian tube, bilateral: Secondary | ICD-10-CM

## 2020-11-14 MED ORDER — METHYLPREDNISOLONE 4 MG PO TBPK
ORAL_TABLET | ORAL | 0 refills | Status: DC
Start: 2020-11-14 — End: 2021-05-11

## 2020-11-14 NOTE — Discharge Instructions (Addendum)
To do to try and equalize your ears as we discussed in clinic.  Increase oral fluid intake to thin out your mucus and help facilitate drainage of your middle ear.  Start the Medrol Dosepak tomorrow morning and take according to the package insert.  Place a heating pad or a hot water bottle underneath your pillowcase at night and lay on it so that the heat will help dilate up eustachian tube and facilitate drainage.  Follow-up with your ENT for continued symptoms.

## 2020-11-14 NOTE — ED Triage Notes (Signed)
Patient c/o bilateral ear pain and pressure that started on Monday.  Patient reports having chronic sinus infections.  Patient denies fevers.  Patient reports some drainage from both ears.  Patient states that she previously was getting over COVID about 15 days ago.

## 2020-11-14 NOTE — ED Provider Notes (Signed)
[facility]    CSN: [id number] Arrival date & time: 11/14/20  1259      History   Chief Complaint Chief Complaint  Patient presents with  . Appointment  . Otalgia    HPI [name] is a 44 y.o. female.   HPI   44 year old female here for evaluation of bilateral ear pain.  Patient reports that she has had pressure in her ears, ringing in her ears, plugged to the point of muffled hearing, and some sinus pressure.  Patient denies fever, sore throat, cough, or lower respiratory symptoms.  Patient is 15 days post recovery from Covid.  Patient has chronic sinus issues secondary to nasal passage collapse.  Patient sees ENT in [facility], Dr. Carlis Abbott, and is being scheduled for septoplasty.  Past Medical History:  Diagnosis Date  . ADD (attention deficit disorder)   . Bone spur    "front of spine" - effects right shoulder (sometimes)  . Depression   . Deviated nasal septum   . Dysplastic nevus 08/21/2019   Right lower abdomen. Moderate atypia, margins free.  Marland Kitchen Hypothyroidism   . Nondisplaced fracture of fifth left metatarsal bone   . PTSD (post-traumatic stress disorder)   . RA (rheumatoid arthritis) ([place]) 2016  . Rheumatoid arthritis ([place]) 12/09/2016  . Sinusitis     Patient Active Problem List   Diagnosis Date Noted  . Acute bacterial conjunctivitis of left eye 10/26/2020  . Bacterial conjunctivitis 10/26/2020  . Non-recurrent acute serous otitis media of left ear 09/28/2020  . History of sinus surgery 09/28/2020  . Chronic sinusitis 09/28/2020  . 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 Cervical radiculitis 05/13/2020  . Overweight 05/13/2020  . Sprain of interphalangeal joint of finger 02/19/2020  . Chronic constipation 12/31/2019  . Chronic rhinitis 09/24/2019  . Deviated nasal septum 09/24/2019  . 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 GAD (generalized anxiety disorder) 03/15/2019  . Moderate episode of  recurrent major depressive disorder ([place]) 03/15/2019  . Rheumatoid arthritis ([place]) 11/01/2018  . Primary osteoarthritis of both hands 05/11/2018  . Primary osteoarthritis of both feet 05/11/2018  . Raynaud's disease without gangrene 05/11/2018  . Asthma 04/20/2018  . Onychomycosis 04/20/2018  . Family history of rheumatoid arthritis 04/10/2018  . Dry mouth 01/23/2018  . PTSD (post-traumatic stress disorder) 10/16/2017  . Insomnia 10/16/2017  . Migraine with aura 10/16/2017  . Hypothyroidism   . ADD (attention deficit disorder)   . Uterine leiomyoma 09/22/2017  . 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 History of uterine fibroid 09/22/2017  . Polyarthralgia 12/09/2016  . Cervical myelopathy ([place]) 06/12/2015    Past Surgical History:  Procedure Laterality Date  . BACK SURGERY    . CESAREAN SECTION    . DILATION AND CURETTAGE OF UTERUS     (x2)  . FRONTAL SINUS EXPLORATION Bilateral 11/19/2015   Procedure: FRONTAL SINUS EXPLORATION;  Surgeon: Margaretha Sheffield, MD;  Location: [place];  Service: ENT;  Laterality: Bilateral;  . IMAGE GUIDED SINUS SURGERY N/A 11/19/2015   Procedure: IMAGE GUIDED SINUS SURGERY;  Surgeon: Margaretha Sheffield, MD;  Location: [place];  Service: ENT;  Laterality: N/A;  GAVE DISK TO CE CE 09/22  . MAXILLARY ANTROSTOMY Bilateral 11/19/2015   Procedure: MAXILLARY ANTROSTOMY;  Surgeon: Margaretha Sheffield, MD;  Location: [place];  Service: ENT;  Laterality: Bilateral;  . SEPTOPLASTY N/A 11/19/2015   Procedure: SEPTOPLASTY;  Surgeon: Margaretha Sheffield, MD;  Location: [place];  Service: ENT;  Laterality: N/A;  . SPHENOIDECTOMY Bilateral 11/19/2015   Procedure: Coralee Pesa;  Surgeon: Margaretha Sheffield, MD;  Location: [place];  Service: ENT;  Laterality: Bilateral;  . TONSILLECTOMY      OB History    Gravida  3   Para  2   Term  1   Preterm  1   AB  1   Living  1     SAB  1   TAB      Ectopic      Multiple       Live Births  1            Home Medications    Prior to Admission medications   Medication Sig Start Date End Date Taking? Authorizing Provider  buPROPion (WELLBUTRIN) 75 MG tablet Take 2 tablets (150 mg total) by mouth every morning AND 1 tablet (75 mg total) daily after lunch. 05/13/20  Yes Bacigalupo, Dionne Bucy, MD  estradiol (ESTRACE) 1 MG tablet Take 1 mg by mouth daily.   Yes [provider]  LINZESS 145 MCG CAPS capsule Take 145 mcg by mouth daily. 02/03/20  Yes [provider]  methocarbamol (ROBAXIN) 500 MG tablet Take by mouth. 06/22/20  Yes [provider]  naratriptan (AMERGE) 2.5 MG tablet Take 1 tablet (2.5 mg total) by mouth as needed for migraine. Take one (1) tablet at onset of headache; if returns or does not resolve, may repeat after 4 hours; do not exceed five (5) mg in 24 hours. 02/14/20  Yes Bacigalupo, Dionne Bucy, MD  norethindrone-ethinyl estradiol-iron (LOESTRIN FE) 1.5-30 MG-MCG tablet Take 1 tablet by mouth daily.   Yes [provider]  SUMAtriptan (IMITREX) 100 MG tablet TK 1 T PO AOS OF HEADACHE. REPEAT IN 2 H AS NEEDED. MAX DOSE 2 T IN 24 H 02/14/20  Yes Bacigalupo, Dionne Bucy, MD  traZODone (DESYREL) 150 MG tablet TAKE 1/2 TABLET(75 MG) BY MOUTH AT BEDTIME 05/25/20  Yes Burnette, Clearnce Sorrel, PA-C  clonazePAM (KLONOPIN) 0.5 MG tablet Take 0.5-1 tablets (0.25-0.5 mg total) by mouth 2 (two) times daily as needed for anxiety. 08/06/20   Virginia Crews, MD  methylPREDNISolone (MEDROL DOSEPAK) 4 MG TBPK tablet Take according to the package insert 11/14/20   Margarette Canada, NP  albuterol (PROVENTIL) (2.5 MG/3ML) 0.083% nebulizer solution Take 3 mLs (2.5 mg total) by nebulization every 6 (six) hours as needed for wheezing or shortness of breath. 07/27/20 11/14/20  Virginia Crews, MD  lisdexamfetamine (VYVANSE) 50 MG capsule Take 1 capsule (50 mg total) by mouth daily. 09/29/20 11/14/20  Virginia Crews, MD    Family History Family  History  Problem Relation Age of Onset  . Rheum arthritis Mother   . Cancer Paternal Grandfather   . Cancer Cousin   . Vascular Disease Father   . Alcohol abuse Brother        history of   . Drug abuse Brother        history of   . Breast cancer Neg Hx     Social History Social History   Tobacco Use  . Smoking status: Never Smoker  . Smokeless tobacco: Never Used  Vaping Use  . Vaping Use: Never used  Substance Use Topics  . Alcohol use: Yes    Alcohol/week: 0.0 standard drinks    Comment: rare- wine   . Drug use: Never     Allergies   Penicillins and Sulfa antibiotics   Review of Systems  Review of Systems  Constitutional: Negative for activity change, appetite change and fever.  HENT: Positive for ear pain and sinus pressure. Negative for congestion, ear discharge, postnasal drip, rhinorrhea and sore throat.   Respiratory: Negative for cough, shortness of breath and wheezing.   Cardiovascular: Negative for chest pain.  Gastrointestinal: Negative for diarrhea, nausea and vomiting.  Musculoskeletal: Negative for arthralgias and myalgias.  Skin: Negative for rash.  Neurological: Negative for syncope and headaches.  Hematological: Negative.   Psychiatric/Behavioral: Negative.      Physical Exam Triage Vital Signs ED Triage Vitals  Enc Vitals Group     BP 11/14/20 1319 122/86     Pulse Rate 11/14/20 1319 100     Resp 11/14/20 1319 14     Temp 11/14/20 1319 98.5 F (36.9 C)     Temp Source 11/14/20 1319 Oral     SpO2 11/14/20 1319 100 %     Weight 11/14/20 1314 140 lb (63.5 kg)     Height 11/14/20 1314 5\' 7"  (1.702 m)     Head Circumference --      Peak Flow --      Pain Score 11/14/20 1314 6     Pain Loc --      Pain Edu? --      Excl. in [place]? --    No data found.  Updated Vital Signs BP 122/86 (BP Location: Right Arm)   Pulse 100   Temp 98.5 F (36.9 C) (Oral)   Resp 14   Ht 5\' 7"  (1.702 m)   Wt 140 lb (63.5 kg)   LMP 11/02/2020 (Exact Date)    SpO2 100%   BMI 21.93 kg/m   Visual Acuity Right Eye Distance:   Left Eye Distance:   Bilateral Distance:    Right Eye Near:   Left Eye Near:    Bilateral Near:     Physical Exam Vitals and nursing note reviewed.  Constitutional:      General: She is not in acute distress.    Appearance: Normal appearance. She is normal weight. She is not toxic-appearing.  HENT:     Head: Normocephalic and atraumatic.     Right Ear: Ear canal and external ear normal.     Left Ear: Ear canal and external ear normal.     Ears:     Comments: Tympanic membrane's are mildly erythematous without injection.  There is a serous effusion behind both tympanic membranes R>L.    Nose: Nose normal.     Comments: Nasal passages are very narrow.  There is no erythema, edema, or nasal discharge.  Patient does have tenderness to bilateral maxillary and right frontal sinus.    Mouth/Throat:     Mouth: Mucous membranes are moist.     Pharynx: Oropharynx is clear. No oropharyngeal exudate or posterior oropharyngeal erythema.  Eyes:     General: No scleral icterus.    Extraocular Movements: Extraocular movements intact.     Conjunctiva/sclera: Conjunctivae normal.     Pupils: Pupils are equal, round, and reactive to light.  Cardiovascular:     Rate and Rhythm: Normal rate and regular rhythm.     Pulses: Normal pulses.     Heart sounds: Normal heart sounds. No murmur heard.  No gallop.   Pulmonary:     Effort: Pulmonary effort is normal.     Breath sounds: Normal breath sounds. No wheezing, rhonchi or rales.  Musculoskeletal:        General: No swelling or  tenderness. Normal range of motion.     Cervical back: Normal range of motion and neck supple.  Lymphadenopathy:     Cervical: No cervical adenopathy.  Skin:    General: Skin is warm.     Capillary Refill: Capillary refill takes less than 2 seconds.     Findings: No erythema or rash.  Neurological:     General: No focal deficit present.     Mental  Status: She is alert and oriented to person, place, and time.  Psychiatric:        Mood and Affect: Mood normal.        Behavior: Behavior normal.        Thought Content: Thought content normal.        Judgment: Judgment normal.      UC Treatments / Results  Labs (all labs ordered are listed, but only abnormal results are displayed) Labs Reviewed - No data to display  EKG   Radiology No results found.  Procedures Procedures (including critical care time)  Medications Ordered in UC Medications - No data to display  Initial Impression / Assessment and Plan / UC Course  I have reviewed the triage vital signs and the nursing notes.  Pertinent labs & imaging results that were available during my care of the patient were reviewed by me and considered in my medical decision making (see chart for details).   Here for evaluation of plugged feeling in her ears to on the past 5 days.  Patient is currently recovering from Covid.  Has effusions behind both tympanic membranes and mild erythema.  The effusions are serous and not milky.  Patient also has tenderness to eustachian tubes bilaterally upon palpation.  We will discharge patient home with a Medrol Dosepak and diagnosed of eustachian tube dysfunction and have her follow-up with her ENT.   Final Clinical Impressions(s) / UC Diagnoses   Final diagnoses:  Eustachian tube dysfunction, bilateral     Discharge Instructions     To do to try and equalize your ears as we discussed in clinic.  Increase oral fluid intake to thin out your mucus and help facilitate drainage of your middle ear.  Start the Medrol Dosepak tomorrow morning and take according to the package insert.  Place a heating pad or a hot water bottle underneath your pillowcase at night and lay on it so that the heat will help dilate up eustachian tube and facilitate drainage.  Follow-up with your ENT for continued symptoms.    ED Prescriptions    Medication Sig  Dispense Auth. Provider   methylPREDNISolone (MEDROL DOSEPAK) 4 MG TBPK tablet Take according to the package insert 1 each Margarette Canada, NP     PDMP not reviewed this encounter.   Margarette Canada, NP 11/14/20 1355

## 2020-11-15 ENCOUNTER — Other Ambulatory Visit: Payer: Self-pay | Admitting: Physician Assistant

## 2020-11-15 NOTE — Telephone Encounter (Signed)
Requested Prescriptions  Pending Prescriptions Disp Refills  . traZODone (DESYREL) 150 MG tablet [Pharmacy Med Name: TRAZODONE 150MG  (HUNDRED-FIFTY) TAB] 45 tablet 0    Sig: TAKE 1/2 TABLET(75 MG) BY MOUTH AT BEDTIME     Psychiatry: Antidepressants - Serotonin Modulator Passed - 11/15/2020  3:36 AM      Passed - Completed PHQ-2 or PHQ-9 in the last 360 days      Passed - Valid encounter within last 6 months    Recent Outpatient Visits          3 weeks ago Acute bacterial conjunctivitis of left eye   [place] [facility], Kelby Aline, FNP   1 month ago Chronic sinusitis, unspecified location   [facility], Kelby Aline, FNP   2 months ago Attention deficit hyperactivity disorder (ADHD), combined type   [facility], Dionne Bucy, MD   3 months ago Moderate persistent asthmatic bronchitis with acute exacerbation   [facility], Dionne Bucy, MD   4 months ago Attention deficit hyperactivity disorder (ADHD), combined type   [facility], Kelby Aline, FNP      Future Appointments            In 2 days Bacigalupo, Dionne Bucy, MD [facility], [place]   In 3 days [facility], [place], [place]

## 2020-11-17 ENCOUNTER — Ambulatory Visit (INDEPENDENT_AMBULATORY_CARE_PROVIDER_SITE_OTHER): Payer: Medicaid Other | Admitting: Family Medicine

## 2020-11-17 ENCOUNTER — Encounter: Payer: Self-pay | Admitting: Family Medicine

## 2020-11-17 ENCOUNTER — Other Ambulatory Visit: Payer: Self-pay

## 2020-11-17 VITALS — BP 120/82 | HR 74 | Temp 98.1°F | Resp 16 | Ht 67.0 in | Wt 149.0 lb

## 2020-11-17 DIAGNOSIS — Z1231 Encounter for screening mammogram for malignant neoplasm of breast: Secondary | ICD-10-CM | POA: Diagnosis not present

## 2020-11-17 DIAGNOSIS — F411 Generalized anxiety disorder: Secondary | ICD-10-CM

## 2020-11-17 DIAGNOSIS — E039 Hypothyroidism, unspecified: Secondary | ICD-10-CM | POA: Diagnosis not present

## 2020-11-17 DIAGNOSIS — Z0000 Encounter for general adult medical examination without abnormal findings: Secondary | ICD-10-CM | POA: Diagnosis not present

## 2020-11-17 DIAGNOSIS — H9193 Unspecified hearing loss, bilateral: Secondary | ICD-10-CM

## 2020-11-17 DIAGNOSIS — H918X3 Other specified hearing loss, bilateral: Secondary | ICD-10-CM | POA: Diagnosis not present

## 2020-11-17 DIAGNOSIS — F902 Attention-deficit hyperactivity disorder, combined type: Secondary | ICD-10-CM | POA: Diagnosis not present

## 2020-11-17 DIAGNOSIS — Z1159 Encounter for screening for other viral diseases: Secondary | ICD-10-CM

## 2020-11-17 DIAGNOSIS — F331 Major depressive disorder, recurrent, moderate: Secondary | ICD-10-CM | POA: Diagnosis not present

## 2020-11-17 MED ORDER — LISDEXAMFETAMINE DIMESYLATE 50 MG PO CAPS: 50.0000 mg | ORAL_CAPSULE | Freq: Every day | ORAL | 0 refills | Status: DC

## 2020-11-17 MED ORDER — BUPROPION HCL 75 MG PO TABS: ORAL_TABLET | ORAL | 3 refills | Status: DC

## 2020-11-17 NOTE — Assessment & Plan Note (Signed)
Chronic and stable Continue Wellbutrin at current dose

## 2020-11-17 NOTE — Patient Instructions (Signed)

## 2020-11-17 NOTE — Assessment & Plan Note (Signed)
Chronic and stable Continue Vyvanse at current dose

## 2020-11-17 NOTE — Assessment & Plan Note (Signed)
Chronic and stable continue Wellbutrin at current dose

## 2020-11-17 NOTE — Progress Notes (Signed)
Complete physical exam   Patient: [name]   DOB: 27-Aug-1976   44 y.o. Female  MRN: [id number] Visit Date: 11/17/2020  Today's healthcare provider: Lavon Paganini, MD   Chief Complaint  Patient presents with  . Annual Exam   Subjective    [name] is a 44 y.o. female who presents today for a complete physical exam.  She reports consuming a general diet. The patient does not participate in regular exercise at present. She generally feels well. She reports sleeping poorly. She does have additional problems to discuss today. Discuss getting antibiotic for dental procedures. Patient requesting hearing test.  HPI  12/22/2017 Pap/HPV-negative 11/20/2019 Mammogram-BI-RADS 1   Past Medical History:  Diagnosis Date  . ADD (attention deficit disorder)   . Bone spur    "front of spine" - effects right shoulder (sometimes)  . Depression   . Deviated nasal septum   . Dysplastic nevus 08/21/2019   Right lower abdomen. Moderate atypia, margins free.  Marland Kitchen History of sinus surgery 09/28/2020  . Hypothyroidism   . Nondisplaced fracture of fifth left metatarsal bone   . PTSD (post-traumatic stress disorder)   . RA (rheumatoid arthritis) ([place]) 2016  . Rheumatoid arthritis ([place]) 12/09/2016  . Sinusitis    Past Surgical History:  Procedure Laterality Date  . ARTHROPLASTY  10/02/2020   C5-6 C6-7   . BACK SURGERY    . CESAREAN SECTION    . DILATION AND CURETTAGE OF UTERUS     (x2)  . FRONTAL SINUS EXPLORATION Bilateral 11/19/2015   Procedure: FRONTAL SINUS EXPLORATION;  Surgeon: Margaretha Sheffield, MD;  Location: [facility];  Service: ENT;  Laterality: Bilateral;  . IMAGE GUIDED SINUS SURGERY N/A 11/19/2015   Procedure: IMAGE GUIDED SINUS SURGERY;  Surgeon: Margaretha Sheffield, MD;  Location: [facility];  Service: ENT;  Laterality: N/A;  GAVE DISK TO CE CE 09/22  . MAXILLARY ANTROSTOMY Bilateral 11/19/2015   Procedure: MAXILLARY ANTROSTOMY;  Surgeon: Margaretha Sheffield,  MD;  Location: [facility];  Service: ENT;  Laterality: Bilateral;  . SEPTOPLASTY N/A 11/19/2015   Procedure: SEPTOPLASTY;  Surgeon: Margaretha Sheffield, MD;  Location: [facility];  Service: ENT;  Laterality: N/A;  . SPHENOIDECTOMY Bilateral 11/19/2015   Procedure: Coralee Pesa;  Surgeon: Margaretha Sheffield, MD;  Location: [facility];  Service: ENT;  Laterality: Bilateral;  . TONSILLECTOMY     Social History   Socioeconomic History  . Marital status: Divorced    Spouse name: Not on file  . Number of children: 2  . Years of education: Not on file  . Highest education level: Not on file  Occupational History    Employer: [employer]   Tobacco Use  . Smoking status: Never Smoker  . Smokeless tobacco: Never Used  Vaping Use  . Vaping Use: Never used  Substance and Sexual Activity  . Alcohol use: Yes    Alcohol/week: 0.0 standard drinks    Comment: rare- wine   . Drug use: Never  . Sexual activity: Yes    Birth control/protection: Pill  Other Topics Concern  . Not on file  Social History Narrative  . Not on file   Social Determinants of Health   Financial Resource Strain:   . Difficulty of Paying Living Expenses: Not on file  Food Insecurity:   . Worried About Charity fundraiser in the Last Year: Not on file  . Ran Out of Food in the Last Year: Not  on file  Transportation Needs:   . Lack of Transportation (Medical): Not on file  . Lack of Transportation (Non-Medical): Not on file  Physical Activity:   . Days of Exercise per Week: Not on file  . Minutes of Exercise per Session: Not on file  Stress:   . Feeling of Stress : Not on file  Social Connections:   . Frequency of Communication with Friends and Family: Not on file  . Frequency of Social Gatherings with Friends and Family: Not on file  . Attends Religious Services: Not on file  . Active Member of Clubs or Organizations: Not on file  . Attends Archivist Meetings: Not on  file  . Marital Status: Not on file  Intimate Partner Violence:   . Fear of Current or Ex-Partner: Not on file  . Emotionally Abused: Not on file  . Physically Abused: Not on file  . Sexually Abused: Not on file   Family Status  Relation Name Status  . Mother  Deceased  . PGF  (Not Specified)  . Cousin  (Not Specified)  . Father  Alive  . Brother  Alive  . Son  Alive  . Daughter  Alive       legally blind in left eye   . Neg Hx  (Not Specified)   Family History  Problem Relation Age of Onset  . Rheum arthritis Mother   . Cancer Paternal Grandfather   . Cancer Cousin   . Vascular Disease Father   . Alcohol abuse Brother        history of   . Drug abuse Brother        history of   . Breast cancer Neg Hx    Allergies  Allergen Reactions  . Penicillins Swelling    Pt states caused throat to swell   . Sulfa Antibiotics Other (See Comments)    Lips tingled    Patient Care Team: Virginia Crews, MD as PCP - General (Family Medicine)   Medications: Outpatient Medications Prior to Visit  Medication Sig  . albuterol (ACCUNEB) 0.63 MG/3ML nebulizer solution Take 1 ampule by nebulization every 6 (six) hours as needed for wheezing.  Marland Kitchen estradiol (ESTRACE) 1 MG tablet Take 1 mg by mouth daily.  Marland Kitchen LINZESS 145 MCG CAPS capsule Take 145 mcg by mouth daily.  . methocarbamol (ROBAXIN) 500 MG tablet Take by mouth.  . methylPREDNISolone (MEDROL DOSEPAK) 4 MG TBPK tablet Take according to the package insert  . naratriptan (AMERGE) 2.5 MG tablet Take 1 tablet (2.5 mg total) by mouth as needed for migraine. Take one (1) tablet at onset of headache; if returns or does not resolve, may repeat after 4 hours; do not exceed five (5) mg in 24 hours.  . Norethindrone Acet-Ethinyl Est (AUROVELA 1.5/30 PO) Take by mouth.  . SUMAtriptan (IMITREX) 100 MG tablet TK 1 T PO AOS OF HEADACHE. REPEAT IN 2 H AS NEEDED. MAX DOSE 2 T IN 24 H  . traZODone (DESYREL) 150 MG tablet TAKE 1/2 TABLET(75 MG)  BY MOUTH AT BEDTIME  . [DISCONTINUED] buPROPion (WELLBUTRIN) 75 MG tablet Take 2 tablets (150 mg total) by mouth every morning AND 1 tablet (75 mg total) daily after lunch.  . [DISCONTINUED] lisdexamfetamine (VYVANSE) 50 MG capsule Take by mouth.  . [DISCONTINUED] clonazePAM (KLONOPIN) 0.5 MG tablet Take 0.5-1 tablets (0.25-0.5 mg total) by mouth 2 (two) times daily as needed for anxiety.  . [DISCONTINUED] norethindrone-ethinyl estradiol-iron (LOESTRIN FE) 1.5-30 MG-MCG tablet  Take 1 tablet by mouth daily. (Patient not taking: Reported on 11/17/2020)   No facility-administered medications prior to visit.    Review of Systems  Constitutional: Positive for activity change.  HENT: Positive for congestion, ear pain, facial swelling, postnasal drip, rhinorrhea, sinus pain, tinnitus and trouble swallowing.   Eyes: Positive for photophobia and pain.  Gastrointestinal: Positive for constipation.  Musculoskeletal: Positive for arthralgias, back pain, joint swelling, myalgias, neck pain and neck stiffness.  Allergic/Immunologic: Positive for environmental allergies.  Neurological: Positive for tremors and headaches.  Psychiatric/Behavioral: Positive for agitation, decreased concentration and sleep disturbance.    Last CBC Lab Results  Component Value Date   WBC 4.9 11/14/2019   HGB 13.3 11/14/2019   HCT 39.1 11/14/2019   MCV 102 (H) 11/14/2019   MCH 34.5 (H) 11/14/2019   RDW 11.5 (L) 11/14/2019   PLT 252 82/99/3716   Last metabolic panel Lab Results  Component Value Date   GLUCOSE 94 11/14/2019   NA 139 11/14/2019   K 4.5 11/14/2019   CL 102 11/14/2019   CO2 24 11/14/2019   BUN 18 11/14/2019   CREATININE 1.06 (H) 11/14/2019   GFRNONAA 64 11/14/2019   GFRAA 73 11/14/2019   CALCIUM 9.0 11/14/2019   PHOS 2.9 02/19/2016   PROT 6.8 11/14/2019   ALBUMIN 4.3 11/14/2019   LABGLOB 2.5 11/14/2019   AGRATIO 1.7 11/14/2019   BILITOT 0.3 11/14/2019   ALKPHOS 43 11/14/2019   AST 19  11/14/2019   ALT 19 11/14/2019   ANIONGAP 9 06/13/2017   Last lipids Lab Results  Component Value Date   CHOL 214 (H) 11/14/2019   HDL 80 11/14/2019   LDLCALC 124 (H) 11/14/2019   TRIG 55 11/14/2019   CHOLHDL 2.7 11/14/2019   Last hemoglobin A1c No results found for: HGBA1C Last thyroid functions Lab Results  Component Value Date   TSH 2.180 08/21/2020   T4TOTAL 11.4 02/19/2016   Last vitamin D Lab Results  Component Value Date   VD25OH 40.5 02/19/2016   Last vitamin B12 and Folate Lab Results  Component Value Date   VITAMINB12 734 11/14/2019   FOLATE >20.0 05/18/2017      Objective    BP 120/82 (BP Location: Left Arm, Patient Position: Sitting, Cuff Size: Normal)   Pulse 74   Temp 98.1 F (36.7 C) (Oral)   Resp 16   Ht 5\' 7"  (1.702 m)   Wt 149 lb (67.6 kg)   LMP 11/02/2020 (Exact Date)   SpO2 97%   BMI 23.34 kg/m  BP Readings from Last 3 Encounters:  11/17/20 120/82  11/14/20 122/86  08/21/20 124/78   Wt Readings from Last 3 Encounters:  11/17/20 149 lb (67.6 kg)  11/14/20 140 lb (63.5 kg)  08/21/20 149 lb 9.6 oz (67.9 kg)      Physical Exam Vitals reviewed.  Constitutional:      General: She is not in acute distress.    Appearance: Normal appearance. She is well-developed. She is not diaphoretic.  HENT:     Head: Normocephalic and atraumatic.     Right Ear: Tympanic membrane, ear canal and external ear normal.     Left Ear: Tympanic membrane, ear canal and external ear normal.  Eyes:     General: No scleral icterus.    Conjunctiva/sclera: Conjunctivae normal.     Pupils: Pupils are equal, round, and reactive to light.  Neck:     Thyroid: No thyromegaly.  Cardiovascular:     Rate and Rhythm: Normal  rate and regular rhythm.     Pulses: Normal pulses.     Heart sounds: Normal heart sounds. No murmur heard.   Pulmonary:     Effort: Pulmonary effort is normal. No respiratory distress.     Breath sounds: Normal breath sounds. No wheezing or  rales.  Abdominal:     General: There is no distension.     Palpations: Abdomen is soft.     Tenderness: There is no abdominal tenderness.  Musculoskeletal:        General: No deformity.     Cervical back: Neck supple.     Right lower leg: No edema.     Left lower leg: No edema.  Lymphadenopathy:     Cervical: No cervical adenopathy.  Skin:    General: Skin is warm and dry.     Findings: No rash.  Neurological:     Mental Status: She is alert and oriented to person, place, and time. Mental status is at baseline.     Sensory: No sensory deficit.     Motor: No weakness.     Gait: Gait normal.  Psychiatric:        Mood and Affect: Mood normal.        Behavior: Behavior normal.        Thought Content: Thought content normal.       Last depression screening scores PHQ 2/9 Scores 11/17/2020 08/21/2020 05/13/2020  PHQ - 2 Score 2 4 0  PHQ- 9 Score 9 17 7    Last fall risk screening Fall Risk  04/12/2019  Falls in the past year? 0   Last Audit-C alcohol use screening Alcohol Use Disorder Test (AUDIT) 11/17/2020  1. How often do you have a drink containing alcohol? 3  2. How many drinks containing alcohol do you have on a typical day when you are drinking? 0  3. How often do you have six or more drinks on one occasion? 0  AUDIT-C Score 3  4. How often during the last year have you found that you were not able to stop drinking once you had started? 0  5. How often during the last year have you failed to do what was normally expected from you because of drinking? 0  6. How often during the last year have you needed a first drink in the morning to get yourself going after a heavy drinking session? 0  7. How often during the last year have you had a feeling of guilt of remorse after drinking? 0  8. How often during the last year have you been unable to remember what happened the night before because you had been drinking? 0  9. Have you or someone else been injured as a result of your  drinking? 0  10. Has a relative or friend or a doctor or another health worker been concerned about your drinking or suggested you cut down? 0  Alcohol Use Disorder Identification Test Final Score (AUDIT) 3  Alcohol Brief Interventions/Follow-up AUDIT Score <7 follow-up not indicated   A score of 3 or more in women, and 4 or more in men indicates increased risk for alcohol abuse, EXCEPT if all of the points are from question 1   No results found for any visits on 11/17/20.  Assessment & Plan    Routine Health Maintenance and Physical Exam  Exercise Activities and Dietary recommendations Goals   None     Immunization History  Administered Date(s) Administered  . Hepatitis A 10/26/2018  .  Moderna SARS-COVID-2 Vaccination 06/26/2020    Health Maintenance  Topic Date Due  . Hepatitis C Screening  Never done  . TETANUS/TDAP  Never done  . COVID-19 Vaccine (2 - Moderna 2-dose series) 07/24/2020  . INFLUENZA VACCINE  03/11/2021 (Originally 07/12/2020)  . PAP SMEAR-Modifier  12/22/2022  . HIV Screening  Completed    Discussed health benefits of physical activity, and encouraged her to engage in regular exercise appropriate for her age and condition.  Problem List Items Addressed This Visit      Endocrine   Hypothyroidism    Previously well controlled Continue current meds Recheck TSH and adjust as indicated        Relevant Orders   TSH + free T4     Other   ADD (attention deficit disorder)    Chronic and stable Continue Vyvanse at current dose      GAD (generalized anxiety disorder)    Chronic and stable Continue Wellbutrin at current dose      Relevant Medications   buPROPion (WELLBUTRIN) 75 MG tablet   Moderate episode of recurrent major depressive disorder (HCC)    Chronic and stable continue Wellbutrin at current dose      Relevant Medications   buPROPion (WELLBUTRIN) 75 MG tablet    Other Visit Diagnoses    Encounter for annual physical exam    -   Primary   Relevant Orders   Hepatitis C Antibody   CBC   TSH + free T4   Lipid panel   Comprehensive metabolic panel   MM 3D SCREEN BREAST BILATERAL   Encounter for screening mammogram for malignant neoplasm of breast       Relevant Orders   MM 3D SCREEN BREAST BILATERAL   Encounter for hepatitis C screening test for low risk patient       Relevant Orders   Hepatitis C Antibody   Decreased hearing of both ears       Other specified hearing loss of both ears       Relevant Orders   Ambulatory referral to ENT       Return in about 6 months (around 05/18/2021) for chronic disease f/u.     I, Lavon Paganini, MD, have reviewed all documentation for this visit. The documentation on 11/17/20 for the exam, diagnosis, procedures, and orders are all accurate and complete.   , Dionne Bucy, MD, MPH [facility]

## 2020-11-17 NOTE — Assessment & Plan Note (Signed)
Previously well controlled Continue current meds Recheck TSH and adjust as indicated

## 2020-11-18 ENCOUNTER — Ambulatory Visit: Payer: Medicaid Other | Admitting: Dermatology

## 2020-11-18 LAB — COMPREHENSIVE METABOLIC PANEL
ALT: 15 IU/L (ref 0–32)
AST: 17 IU/L (ref 0–40)
Albumin/Globulin Ratio: 2.5 — ABNORMAL HIGH (ref 1.2–2.2)
Albumin: 4.7 g/dL (ref 3.8–4.8)
Alkaline Phosphatase: 55 IU/L (ref 44–121)
BUN/Creatinine Ratio: 14 (ref 9–23)
BUN: 14 mg/dL (ref 6–24)
Bilirubin Total: 0.6 mg/dL (ref 0.0–1.2)
CO2: 21 mmol/L (ref 20–29)
Calcium: 9.4 mg/dL (ref 8.7–10.2)
Chloride: 101 mmol/L (ref 96–106)
Creatinine, Ser: 0.97 mg/dL (ref 0.57–1.00)
GFR calc Af Amer: 81 mL/min/{1.73_m2} (ref >59)
GFR calc non Af Amer: 70 mL/min/{1.73_m2} (ref >59)
Globulin, Total: 1.9 g/dL (ref 1.5–4.5)
Glucose: 98 mg/dL (ref 65–99)
Potassium: 4.1 mmol/L (ref 3.5–5.2)
Sodium: 137 mmol/L (ref 134–144)
Total Protein: 6.6 g/dL (ref 6.0–8.5)

## 2020-11-18 LAB — CBC
Hematocrit: 37 % (ref 34.0–46.6)
Hemoglobin: 12.7 g/dL (ref 11.1–15.9)
MCH: 33.3 pg — ABNORMAL HIGH (ref 26.6–33.0)
MCHC: 34.3 g/dL (ref 31.5–35.7)
MCV: 97 fL (ref 79–97)
Platelets: 354 10*3/uL (ref 150–450)
RBC: 3.81 x10E6/uL (ref 3.77–5.28)
RDW: 11.2 % — ABNORMAL LOW (ref 11.7–15.4)
WBC: 10.3 10*3/uL (ref 3.4–10.8)

## 2020-11-18 LAB — LIPID PANEL
Chol/HDL Ratio: 3.3 ratio (ref 0.0–4.4)
Cholesterol, Total: 234 mg/dL — ABNORMAL HIGH (ref 100–199)
HDL: 72 mg/dL (ref >39)
LDL Chol Calc (NIH): 146 mg/dL — ABNORMAL HIGH (ref 0–99)
Triglycerides: 94 mg/dL (ref 0–149)
VLDL Cholesterol Cal: 16 mg/dL (ref 5–40)

## 2020-11-18 LAB — TSH+FREE T4
Free T4: 1.35 ng/dL (ref 0.82–1.77)
TSH: 2.37 u[IU]/mL (ref 0.450–4.500)

## 2020-11-18 LAB — HEPATITIS C ANTIBODY: Hep C Virus Ab: <0.1 s/co ratio (ref 0.0–0.9)

## 2020-11-19 DIAGNOSIS — M47812 Spondylosis without myelopathy or radiculopathy, cervical region: Secondary | ICD-10-CM | POA: Diagnosis not present

## 2020-11-19 DIAGNOSIS — M4322 Fusion of spine, cervical region: Secondary | ICD-10-CM | POA: Diagnosis not present

## 2020-11-19 DIAGNOSIS — Z981 Arthrodesis status: Secondary | ICD-10-CM | POA: Diagnosis not present

## 2020-11-19 DIAGNOSIS — Z9889 Other specified postprocedural states: Secondary | ICD-10-CM | POA: Diagnosis not present

## 2020-11-24 ENCOUNTER — Ambulatory Visit: Payer: Medicaid Other | Admitting: Family Medicine

## 2020-11-24 ENCOUNTER — Encounter: Payer: Self-pay | Admitting: Family Medicine

## 2020-11-24 MED ORDER — DOXYCYCLINE HYCLATE 100 MG PO TABS
100.0000 mg | ORAL_TABLET | Freq: Two times a day (BID) | ORAL | 0 refills | Status: AC
Start: 2020-11-24 — End: 2020-12-01

## 2020-11-24 MED ORDER — FLUCONAZOLE 150 MG PO TABS
150.0000 mg | ORAL_TABLET | Freq: Once | ORAL | 0 refills | Status: AC
Start: 2020-11-24 — End: 2020-11-24

## 2020-11-30 ENCOUNTER — Ambulatory Visit: Payer: Medicaid Other | Admitting: Adult Health

## 2020-11-30 ENCOUNTER — Ambulatory Visit: Payer: Self-pay

## 2020-11-30 ENCOUNTER — Other Ambulatory Visit: Payer: Self-pay

## 2020-11-30 ENCOUNTER — Ambulatory Visit
Admission: RE | Admit: 2020-11-30 | Discharge: 2020-11-30 | Disposition: A | Discharge status: Home / Self Care | Payer: Medicaid Other | Source: Ambulatory Visit | Attending: Adult Health | Admitting: Adult Health

## 2020-11-30 ENCOUNTER — Encounter: Payer: Self-pay | Admitting: Adult Health

## 2020-11-30 VITALS — BP 124/82 | HR 112 | Temp 98.6°F | Resp 16 | Wt 150.2 lb

## 2020-11-30 DIAGNOSIS — W19XXXA Unspecified fall, initial encounter: Secondary | ICD-10-CM | POA: Insufficient documentation

## 2020-11-30 DIAGNOSIS — S5002XA Contusion of left elbow, initial encounter: Secondary | ICD-10-CM | POA: Insufficient documentation

## 2020-11-30 DIAGNOSIS — M25522 Pain in left elbow: Secondary | ICD-10-CM | POA: Insufficient documentation

## 2020-11-30 DIAGNOSIS — S4992XA Unspecified injury of left shoulder and upper arm, initial encounter: Secondary | ICD-10-CM | POA: Diagnosis not present

## 2020-11-30 DIAGNOSIS — S59912A Unspecified injury of left forearm, initial encounter: Secondary | ICD-10-CM | POA: Diagnosis not present

## 2020-11-30 NOTE — Telephone Encounter (Signed)
Pt. Reports she fell this morning on a ramp and hit her left elbow. Elbow is swollen and bruised. Hurts to move. Appointment made for today.  Reason for Disposition . [1] SEVERE pain AND [2] not improved 2 hours after pain medicine/ice packs  Answer Assessment - Initial Assessment Questions 1. MECHANISM: "How did the injury happen?"     Fall 2. ONSET: "When did the injury happen?" (Minutes or hours ago)      This morning 3. LOCATION: "What part of the elbow is the injured?"      Point 4. APPEARANCE of INJURY: "What does the injury look like?"      Swelling , bruising 5. SEVERITY: "Can you use the elbow normally?"  "Can you bend it and straighten it fully?"     Yes 6. SIZE: For cuts, bruises, or swelling, ask: "How large is it?" (e.g., inches or centimeters; entire joint)      No 7. PAIN: "Is there pain?" If Yes, ask: "How bad is the pain?"    (Scale 1-10; or mild, moderate, severe)     5 8. TETANUS: For any breaks in the skin, ask: "When was the last tetanus booster?"   - NONE (0): no pain.   - MILD (1-3): doesn't interfere with normal activities.   - MODERATE (4-7): interferes with normal activities (e.g., work or school) or awakens from sleep.   - SEVERE (8-10): excruciating pain, unable to do any normal activities, unable to use arm at all.     No 9. OTHER SYMPTOMS: "Do you have any other symptoms?"  (e.g., numbness in hand)     No 10. PREGNANCY: "Is there any chance you are pregnant?" "When was your last menstrual period?"       No  Protocols used: ELBOW INJURY-A-AH

## 2020-11-30 NOTE — Progress Notes (Signed)
Joby I am sorry they did not bring you your discharge papers X Ray of your arm and elbow looks good rest, elevate and ice it and ibuprofen 600mg  every eight hours alternating with Tylenol ok per package. Hopefully just bruised from fall. If you notice more swelling or pain please go to [facility] they have walk in clinic from 1 pm to 7 pm Monday to Friday across from [facility]. Thanks [employer]

## 2020-11-30 NOTE — Progress Notes (Signed)
X Ray of your arm and elbow looks good rest, elevate and ice it and ibuprofen 600mg  every eight hours alternating with Tylenol ok per package. Hopefully just bruised from fall. If you notice more swelling or pain please go to [facility] they have walk in clinic from 1 pm to 7 pm Monday to Friday across from [place]. Apply a compressive Rest and elevate the affected painful area.  Apply cold compresses intermittently as needed.   [facility] if any symptoms persist.

## 2020-11-30 NOTE — Progress Notes (Signed)
Established patient visit   Patient: [name]   DOB: 1976-01-29   44 y.o. Female  MRN: [id number] Visit Date: 11/30/2020  Today's healthcare provider: Marcille Buffy, FNP   Chief Complaint  Patient presents with  . Fall   Subjective    Fall The accident occurred 6 to 12 hours ago. The fall occurred while walking. She fell from a height of 1 to 2 ft. Impact surface: metal rail. There was no blood loss. The point of impact was the left elbow. The pain is at a severity of 6/10. The symptoms are aggravated by pressure on injury and movement. She has tried NSAID for the symptoms.   Left elbow swollen,. Range of motion is normal. She denies hurting her neck or landing on it. She has had cervical surgery reports she feels  fine in cervical area.  Did not hit head. Denies any syncope.  Patient  denies any fever, body aches,chills, rash, chest pain, shortness of breath, nausea, vomiting, or diarrhea.  Denies dizziness, lightheadedness, pre syncopal or syncopal episodes.         Medications: Outpatient Medications Prior to Visit  Medication Sig  . albuterol (ACCUNEB) 0.63 MG/3ML nebulizer solution Take 1 ampule by nebulization every 6 (six) hours as needed for wheezing.  Marland Kitchen buPROPion (WELLBUTRIN) 75 MG tablet Take 2 tablets (150 mg total) by mouth every morning AND 1 tablet (75 mg total) daily after lunch.  . doxycycline (VIBRA-TABS) 100 MG tablet Take 1 tablet (100 mg total) by mouth 2 (two) times daily for 7 days.  Marland Kitchen estradiol (ESTRACE) 1 MG tablet Take 1 mg by mouth daily.  Marland Kitchen LINZESS 145 MCG CAPS capsule Take 145 mcg by mouth daily.  Marland Kitchen lisdexamfetamine (VYVANSE) 50 MG capsule Take 1 capsule (50 mg total) by mouth daily.  . methocarbamol (ROBAXIN) 500 MG tablet Take by mouth.  . methylPREDNISolone (MEDROL DOSEPAK) 4 MG TBPK tablet Take according to the package insert  . naratriptan (AMERGE) 2.5 MG tablet Take 1 tablet (2.5 mg total) by mouth as needed for migraine.  Take one (1) tablet at onset of headache; if returns or does not resolve, may repeat after 4 hours; do not exceed five (5) mg in 24 hours.  . Norethindrone Acet-Ethinyl Est (AUROVELA 1.5/30 PO) Take by mouth.  . SUMAtriptan (IMITREX) 100 MG tablet TK 1 T PO AOS OF HEADACHE. REPEAT IN 2 H AS NEEDED. MAX DOSE 2 T IN 24 H  . traZODone (DESYREL) 150 MG tablet TAKE 1/2 TABLET(75 MG) BY MOUTH AT BEDTIME   No facility-administered medications prior to visit.    Review of Systems  Constitutional: Negative.   HENT: Negative.   Genitourinary: Negative.   Musculoskeletal: Positive for arthralgias and joint swelling.  Neurological: Negative.   Hematological: Negative.   Psychiatric/Behavioral: Negative.       Objective    BP 124/82   Pulse (!) 112   Temp 98.6 F (37 C) (Oral)   Resp 16   Wt 150 lb 3.2 oz (68.1 kg)   LMP 11/02/2020 (Exact Date)   BMI 23.52 kg/m    Physical Exam Vitals reviewed.  Constitutional:      General: She is not in acute distress.    Appearance: Normal appearance. She is not ill-appearing, toxic-appearing or diaphoretic.  HENT:     Head: Normocephalic and atraumatic.     Right Ear: External ear normal.     Left Ear: External ear normal.  Nose: Nose normal.     Mouth/Throat:     Mouth: Mucous membranes are moist.  Eyes:     General: No scleral icterus.    Pupils: Pupils are equal, round, and reactive to light.  Cardiovascular:     Rate and Rhythm: Normal rate and regular rhythm.     Pulses: Normal pulses.     Heart sounds: Normal heart sounds.  Pulmonary:     Effort: Pulmonary effort is normal.     Breath sounds: Normal breath sounds. No stridor. No wheezing, rhonchi or rales.  Chest:     Chest wall: No tenderness.  Abdominal:     General: There is no distension.     Palpations: Abdomen is soft.     Tenderness: There is no abdominal tenderness.  Musculoskeletal:     Right shoulder: Normal.     Left shoulder: Normal.     Right upper arm:  Normal.     Left upper arm: Normal.     Right elbow: Normal.     Left elbow: Swelling and effusion present. Normal range of motion. Tenderness present in lateral epicondyle.     Right forearm: Normal.     Left forearm: Normal.     Cervical back: Full passive range of motion without pain, normal range of motion and neck supple.  Skin:    Findings: No erythema.  Neurological:     General: No focal deficit present.     Mental Status: She is alert and oriented to person, place, and time.     Motor: No weakness.     Gait: Gait normal.  Psychiatric:        Mood and Affect: Mood normal.        Behavior: Behavior normal.        Thought Content: Thought content normal.        Judgment: Judgment normal.      No results found for any visits on 11/30/20.  Assessment & Plan     Left elbow pain - Plan: DG Elbow Complete Left, DG Humerus Left, DG Forearm Left, Ambulatory referral to Orthopedic Surgery  Fall, initial encounter - Plan: DG Elbow Complete Left, DG Humerus Left, DG Forearm Left, Ambulatory referral to Orthopedic Surgery  Contusion of left elbow, initial encounter - Plan: Ambulatory referral to Orthopedic Surgery  Orders Placed This Encounter  Procedures  . DG Elbow Complete Left  . DG Humerus Left  . DG Forearm Left  . Ambulatory referral to Orthopedic Surgery   [facility] walk in Monday to Friday 1 pm to 7 pm if worsening. X ray to rule out fracture from fall.   Return in about 3 days (around 12/03/2020), or if symptoms worsen or fail to improve.     Red Flags discussed. The patient was given clear instructions to go to ER or return to medical center if any red flags develop, symptoms do not improve, worsen or new problems develop. They verbalized understanding.  NSAID alternating with tylenol per package instructions ok.   Rest and elevate the affected painful area.  Apply cold compresses intermittently as needed.    Call if symptoms persist.  The entirety of  the information documented in the History of Present Illness, Review of Systems and Physical Exam were personally obtained by me. Portions of this information were initially documented by the CMA and reviewed by me for thoroughness and accuracy.      Marcille Buffy, [place] [phone] (phone) [phone] (  fax)  [place]

## 2020-11-30 NOTE — Patient Instructions (Signed)
Elbow Bursitis ° °Bursitis is swelling and pain at the tip of the elbow. This happens when fluid builds up in a sac under the skin (bursa). This may also be called olecranon bursitis. °What are the causes? °Elbow bursitis may be caused by: °· Elbow injury, such as falling onto the elbow. °· Leaning on hard surfaces for long periods of time. °· Infection from an injury that breaks the skin near the elbow. °· A bone growth (spur) that forms at the tip of the elbow. °· A medical condition that causes inflammation, such as gout or rheumatoid arthritis. °Sometimes the cause is not known. °What are the signs or symptoms? °The first sign of elbow bursitis is usually swelling at the tip of the elbow. This can grow to be about the size of a golf ball. Swelling may start suddenly or develop gradually. Other symptoms may include: °· Pain when bending or leaning on the elbow. °· Not being able to move the elbow normally. °If bursitis is caused by an infection, you may have: °· Redness, warmth, and tenderness of the elbow. °· Drainage of pus from the swollen area over the elbow, if the skin breaks open. °How is this diagnosed? °This condition may be diagnosed based on: °· Your symptoms and medical history. °· Any recent injuries you have had. °· A physical exam. °· X-rays to check for a bone spur or fracture. °· Draining fluid from the bursa to test it for infection. °· Blood tests to rule out gout or rheumatoid arthritis. °How is this treated? °Treatment for elbow bursitis depends on the cause. Treatment may include: °· Medicines. These may include: °? Over-the-counter medicines to relieve pain and inflammation. °? Antibiotic medicines. °? Injections of anti-inflammatory medicines (steroids). °· Draining fluid from the bursa. °· Wrapping your elbow with a bandage. °· Wearing elbow pads. °If these treatments do not help, you may need surgery to remove the bursa. °Follow these instructions at home: °Medicines °· Take  over-the-counter and prescription medicines only as told by your health care provider. °· If you were prescribed an antibiotic medicine, take it as told by your health care provider. Do not stop taking the antibiotic even if you start to feel better. °Managing pain, stiffness, and swelling ° °· If directed, put ice on your elbow: °? Put ice in a plastic bag. °? Place a towel between your skin and the bag. °? Leave the ice on for 20 minutes, 2-3 times a day. °· If your bursitis is caused by an injury, rest your elbow and wear your bandage as told by your health care provider. °· Use elbow pads or elbow wraps to cushion your elbow as needed. °General instructions °· Avoid any activities that cause elbow pain. Ask your health care provider what activities are safe for you. °· Keep all follow-up visits as told by your health care provider. This is important. °Contact a health care provider if you have: °· A fever. °· Symptoms that do not get better with treatment. °· Pain or swelling that: °? Gets worse. °? Goes away and then comes back. °· Pus draining from your elbow. °Get help right away if you have: °· Trouble moving your arm, hand, or fingers. °Summary °· Elbow bursitis is inflammation of the fluid-filled sac (bursa) between the tip of your elbow bone (olecranon) and your skin. °· Treatment for elbow bursitis depends on the cause. It may include medicines to relieve pain and inflammation, antibiotic medicines, and draining fluid from your elbow. °·   Contact a health care provider if your symptoms do not get better with treatment, or if your symptoms go away and then come back. °This information is not intended to replace advice given to you by your health care provider. Make sure you discuss any questions you have with your health care provider. °Document Revised: 11/10/2017 Document Reviewed: 11/07/2017 °Elsevier Patient Education © 2020 [employer] Inc. ° °

## 2020-12-16 DIAGNOSIS — M542 Cervicalgia: Secondary | ICD-10-CM | POA: Diagnosis not present

## 2020-12-16 DIAGNOSIS — M436 Torticollis: Secondary | ICD-10-CM | POA: Diagnosis not present

## 2020-12-16 DIAGNOSIS — M6281 Muscle weakness (generalized): Secondary | ICD-10-CM | POA: Diagnosis not present

## 2020-12-18 DIAGNOSIS — F332 Major depressive disorder, recurrent severe without psychotic features: Secondary | ICD-10-CM | POA: Diagnosis not present

## 2020-12-18 DIAGNOSIS — F4310 Post-traumatic stress disorder, unspecified: Secondary | ICD-10-CM | POA: Diagnosis not present

## 2020-12-18 DIAGNOSIS — F900 Attention-deficit hyperactivity disorder, predominantly inattentive type: Secondary | ICD-10-CM | POA: Diagnosis not present

## 2020-12-21 DIAGNOSIS — M542 Cervicalgia: Secondary | ICD-10-CM | POA: Diagnosis not present

## 2020-12-25 DIAGNOSIS — M542 Cervicalgia: Secondary | ICD-10-CM | POA: Diagnosis not present

## 2020-12-31 ENCOUNTER — Other Ambulatory Visit: Payer: Self-pay | Admitting: Obstetrics and Gynecology

## 2020-12-31 DIAGNOSIS — M542 Cervicalgia: Secondary | ICD-10-CM | POA: Diagnosis not present

## 2020-12-31 DIAGNOSIS — Z1231 Encounter for screening mammogram for malignant neoplasm of breast: Secondary | ICD-10-CM

## 2020-12-31 DIAGNOSIS — Z01419 Encounter for gynecological examination (general) (routine) without abnormal findings: Secondary | ICD-10-CM | POA: Diagnosis not present

## 2021-01-04 ENCOUNTER — Telehealth (INDEPENDENT_AMBULATORY_CARE_PROVIDER_SITE_OTHER): Payer: Medicaid Other | Admitting: Physician Assistant

## 2021-01-04 DIAGNOSIS — J0140 Acute pansinusitis, unspecified: Secondary | ICD-10-CM

## 2021-01-04 DIAGNOSIS — B379 Candidiasis, unspecified: Secondary | ICD-10-CM | POA: Diagnosis not present

## 2021-01-04 DIAGNOSIS — M542 Cervicalgia: Secondary | ICD-10-CM | POA: Diagnosis not present

## 2021-01-04 DIAGNOSIS — T3695XA Adverse effect of unspecified systemic antibiotic, initial encounter: Secondary | ICD-10-CM

## 2021-01-04 DIAGNOSIS — L308 Other specified dermatitis: Secondary | ICD-10-CM

## 2021-01-04 MED ORDER — MOMETASONE FUROATE 0.1 % EX CREA: 1.0000 "application " | TOPICAL_CREAM | Freq: Every day | CUTANEOUS | 0 refills | Status: DC

## 2021-01-04 MED ORDER — FLUCONAZOLE 150 MG PO TABS: 150.0000 mg | ORAL_TABLET | Freq: Once | ORAL | 0 refills | Status: AC

## 2021-01-04 MED ORDER — DOXYCYCLINE HYCLATE 100 MG PO TABS: 100.0000 mg | ORAL_TABLET | Freq: Two times a day (BID) | ORAL | 0 refills | Status: DC

## 2021-01-04 NOTE — Progress Notes (Signed)
MyChart Video Visit    Virtual Visit via Video Note   This visit type was conducted due to national recommendations for restrictions regarding the COVID-19 Pandemic (e.g. social distancing) in an effort to limit this patient's exposure and mitigate transmission in our community. This patient is at least at moderate risk for complications without adequate follow up. This format is felt to be most appropriate for this patient at this time. Physical exam was limited by quality of the video and audio technology used for the visit.   Patient location: Home Provider location: [facility]  I discussed the limitations of evaluation and management by telemedicine and the availability of in person appointments. The patient expressed understanding and agreed to proceed.  Patient: [name]   DOB: 11-04-1976   45 y.o. Female  MRN: [id number] Visit Date: 01/04/2021  Today's healthcare provider: Mar Daring, PA-C   Chief Complaint  Patient presents with  . Rash    On eyelids and under her eyes.     Subjective    Rash This is a new problem. The current episode started in the past 7 days. The affected locations include the face. The rash is characterized by burning, swelling, redness and itchiness. Pertinent negatives include no congestion, cough, eye pain, facial edema, fatigue, fever, nail changes, rhinorrhea, shortness of breath or sore throat. Past treatments include antihistamine and topical steroids (Mometasone cream). The treatment provided no relief.  Sinusitis This is a recurrent problem. The current episode started 1 to 4 weeks ago. The problem has been gradually worsening since onset. Associated symptoms include headaches and sinus pressure. Pertinent negatives include no congestion, coughing, ear pain, shortness of breath, sore throat or swollen glands.   HPI    Rash     Additional comments: On eyelids and under her eyes.         Last edited by Kizzie Furnish, CMA on 01/04/2021  4:43 PM. (History)        Patient Active Problem List   Diagnosis Date Noted  . Contusion of left elbow 11/30/2020  . Fall 11/30/2020  . Left elbow pain 11/30/2020  . Bacterial conjunctivitis 10/26/2020  . Chronic sinusitis 09/28/2020  . 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 Cervical radiculitis 05/13/2020  . Sprain of interphalangeal joint of finger 02/19/2020  . Chronic constipation 12/31/2019  . Deviated nasal septum 09/24/2019  . 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 GAD (generalized anxiety disorder) 03/15/2019  . Moderate episode of recurrent major depressive disorder ([place]) 03/15/2019  . Rheumatoid arthritis ([place]) 11/01/2018  . Primary osteoarthritis of both hands 05/11/2018  . Primary osteoarthritis of both feet 05/11/2018  . Raynaud's disease without gangrene 05/11/2018  . Asthma 04/20/2018  . Onychomycosis 04/20/2018  . Family history of rheumatoid arthritis 04/10/2018  . Dry mouth 01/23/2018  . PTSD (post-traumatic stress disorder) 10/16/2017  . Insomnia 10/16/2017  . Migraine with aura 10/16/2017  . Hypothyroidism   . ADD (attention deficit disorder)   . Uterine leiomyoma 09/22/2017  . 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 Polyarthralgia 12/09/2016  . Cervical myelopathy ([place]) 06/12/2015   Past Medical History:  Diagnosis Date  . ADD (attention deficit disorder)   . Bone spur    "front of spine" - effects right shoulder (sometimes)  . Depression   . Deviated nasal septum   . Dysplastic nevus 08/21/2019   Right lower abdomen. Moderate atypia, margins free.  Marland Kitchen History of sinus  surgery 09/28/2020  . Hypothyroidism   . Nondisplaced fracture of fifth left metatarsal bone   . PTSD (post-traumatic stress disorder)   . RA (rheumatoid arthritis) (HCC) 2016  . Rheumatoid arthritis (HCC) 12/09/2016  . Sinusitis    Social History   Tobacco Use  . Smoking status: Never Smoker  . Smokeless  tobacco: Never Used  Vaping Use  . Vaping Use: Never used  Substance Use Topics  . Alcohol use: Yes    Alcohol/week: 0.0 standard drinks    Comment: rare- wine   . Drug use: Never   Allergies  Allergen Reactions  . Penicillins Swelling    Pt states caused throat to swell   . Sulfa Antibiotics Other (See Comments)    Lips tingled    Medications: Outpatient Medications Prior to Visit  Medication Sig  . albuterol (ACCUNEB) 0.63 MG/3ML nebulizer solution Take 1 ampule by nebulization every 6 (six) hours as needed for wheezing.  Marland Kitchen buPROPion (WELLBUTRIN) 75 MG tablet Take 2 tablets (150 mg total) by mouth every morning AND 1 tablet (75 mg total) daily after lunch.  . estradiol (ESTRACE) 1 MG tablet Take 1 mg by mouth daily.  Marland Kitchen LINZESS 145 MCG CAPS capsule Take 145 mcg by mouth daily.  Marland Kitchen lisdexamfetamine (VYVANSE) 50 MG capsule Take 1 capsule (50 mg total) by mouth daily.  . methocarbamol (ROBAXIN) 500 MG tablet Take by mouth.  . methylPREDNISolone (MEDROL DOSEPAK) 4 MG TBPK tablet Take according to the package insert  . naratriptan (AMERGE) 2.5 MG tablet Take 1 tablet (2.5 mg total) by mouth as needed for migraine. Take one (1) tablet at onset of headache; if returns or does not resolve, may repeat after 4 hours; do not exceed five (5) mg in 24 hours.  . Norethindrone Acet-Ethinyl Est (AUROVELA 1.5/30 PO) Take by mouth.  . SUMAtriptan (IMITREX) 100 MG tablet TK 1 T PO AOS OF HEADACHE. REPEAT IN 2 H AS NEEDED. MAX DOSE 2 T IN 24 H  . traZODone (DESYREL) 150 MG tablet TAKE 1/2 TABLET(75 MG) BY MOUTH AT BEDTIME   No facility-administered medications prior to visit.    Review of Systems  Constitutional: Negative.  Negative for fatigue and fever.  HENT: Positive for sinus pressure and sinus pain. Negative for congestion, ear discharge, ear pain, hearing loss, postnasal drip, rhinorrhea and sore throat.   Eyes: Positive for discharge and itching. Negative for photophobia, pain, redness and  visual disturbance.  Respiratory: Negative.  Negative for cough and shortness of breath.   Skin: Positive for rash. Negative for nail changes.  Neurological: Positive for headaches. Negative for dizziness and light-headedness.      Objective    There were no vitals taken for this visit.   Physical Exam Vitals reviewed.  Constitutional:      Appearance: Normal appearance. She is well-developed and well-nourished.  HENT:     Head: Normocephalic and atraumatic.  Eyes:     Extraocular Movements: EOM normal.  Pulmonary:     Effort: Pulmonary effort is normal. No respiratory distress.  Musculoskeletal:     Cervical back: Normal range of motion and neck supple.  Skin:    Comments: Erythematous rash noted on the upper eyelids bilaterally  Neurological:     Mental Status: She is alert.  Psychiatric:        Mood and Affect: Mood and affect normal.        Behavior: Behavior normal.        Thought Content: Thought content  normal.        Judgment: Judgment normal.       Assessment & Plan     1. Other eczema Will give mometasone cream as below. Advised to not touch in the eye as it can cause vision loss. Advised to use a small amount when applying. She reports she has been using less than the head of a pen.  - mometasone (ELOCON) 0.1 % cream; Apply 1 application topically daily.  Dispense: 45 g; Refill: 0  2. Acute non-recurrent pansinusitis Worsening symptoms that have not responded to OTC medications. Will give Doxycycline as below. Continue allergy medications. Stay well hydrated and get plenty of rest. Call if no symptom improvement or if symptoms worsen. - doxycycline (VIBRA-TABS) 100 MG tablet; Take 1 tablet (100 mg total) by mouth 2 (two) times daily.  Dispense: 20 tablet; Refill: 0  3. Antibiotic-induced yeast infection Gets yeast infections with antibiotics. Diflucan given as below.  - fluconazole (DIFLUCAN) 150 MG tablet; Take 1 tablet (150 mg total) by mouth once for 1  dose. May repeat in 48-72 hrs if needed  Dispense: 2 tablet; Refill: 0   No follow-ups on file.     I discussed the assessment and treatment plan with the patient. The patient was provided an opportunity to ask questions and all were answered. The patient agreed with the plan and demonstrated an understanding of the instructions.   The patient was advised to call back or seek an in-person evaluation if the symptoms worsen or if the condition fails to improve as anticipated.  I provided 15 minutes of face-to-face time during this encounter via MyChart Video enabled encounter.  Reynolds Bowl, PA-C, have reviewed all documentation for this visit. The documentation on 01/07/21 for the exam, diagnosis, procedures, and orders are all accurate and complete.  [place] [facility] [phone] (phone) [phone] (fax)  [place]

## 2021-01-07 ENCOUNTER — Encounter: Payer: Self-pay | Admitting: Family Medicine

## 2021-01-07 ENCOUNTER — Encounter: Payer: Self-pay | Admitting: Physician Assistant

## 2021-01-07 DIAGNOSIS — Z9889 Other specified postprocedural states: Secondary | ICD-10-CM | POA: Diagnosis not present

## 2021-01-07 DIAGNOSIS — Z8669 Personal history of other diseases of the nervous system and sense organs: Secondary | ICD-10-CM | POA: Diagnosis not present

## 2021-01-07 DIAGNOSIS — G959 Disease of spinal cord, unspecified: Secondary | ICD-10-CM | POA: Diagnosis not present

## 2021-01-08 MED ORDER — LISDEXAMFETAMINE DIMESYLATE 50 MG PO CAPS
50.0000 mg | ORAL_CAPSULE | Freq: Every day | ORAL | 0 refills | Status: DC
Start: 2021-01-08 — End: 2021-05-06

## 2021-01-08 MED ORDER — LISDEXAMFETAMINE DIMESYLATE 50 MG PO CAPS: 50.0000 mg | ORAL_CAPSULE | Freq: Every day | ORAL | 0 refills | Status: DC

## 2021-01-13 DIAGNOSIS — F4310 Post-traumatic stress disorder, unspecified: Secondary | ICD-10-CM | POA: Diagnosis not present

## 2021-01-13 DIAGNOSIS — F900 Attention-deficit hyperactivity disorder, predominantly inattentive type: Secondary | ICD-10-CM | POA: Diagnosis not present

## 2021-01-13 DIAGNOSIS — F332 Major depressive disorder, recurrent severe without psychotic features: Secondary | ICD-10-CM | POA: Diagnosis not present

## 2021-01-19 DIAGNOSIS — F900 Attention-deficit hyperactivity disorder, predominantly inattentive type: Secondary | ICD-10-CM | POA: Diagnosis not present

## 2021-01-19 DIAGNOSIS — F4310 Post-traumatic stress disorder, unspecified: Secondary | ICD-10-CM | POA: Diagnosis not present

## 2021-01-19 DIAGNOSIS — F332 Major depressive disorder, recurrent severe without psychotic features: Secondary | ICD-10-CM | POA: Diagnosis not present

## 2021-01-20 DIAGNOSIS — L239 Allergic contact dermatitis, unspecified cause: Secondary | ICD-10-CM | POA: Diagnosis not present

## 2021-01-25 ENCOUNTER — Other Ambulatory Visit: Payer: Self-pay

## 2021-01-25 DIAGNOSIS — M542 Cervicalgia: Secondary | ICD-10-CM | POA: Diagnosis not present

## 2021-01-25 MED ORDER — SUMATRIPTAN SUCCINATE 100 MG PO TABS: ORAL_TABLET | ORAL | 5 refills | Status: DC

## 2021-01-25 NOTE — Telephone Encounter (Signed)
[place] faxed refill request for the following medications:   SUMAtriptan (IMITREX) 100 MG tablet   Please advise.  Thanks, [employer]

## 2021-01-27 DIAGNOSIS — M542 Cervicalgia: Secondary | ICD-10-CM | POA: Diagnosis not present

## 2021-01-29 DIAGNOSIS — Z20822 Contact with and (suspected) exposure to covid-19: Secondary | ICD-10-CM | POA: Diagnosis not present

## 2021-01-29 DIAGNOSIS — Z03818 Encounter for observation for suspected exposure to other biological agents ruled out: Secondary | ICD-10-CM | POA: Diagnosis not present

## 2021-02-10 ENCOUNTER — Other Ambulatory Visit: Payer: Self-pay | Admitting: Otolaryngology

## 2021-02-10 DIAGNOSIS — H9311 Tinnitus, right ear: Secondary | ICD-10-CM

## 2021-02-10 DIAGNOSIS — H9012 Conductive hearing loss, unilateral, left ear, with unrestricted hearing on the contralateral side: Secondary | ICD-10-CM | POA: Diagnosis not present

## 2021-02-10 DIAGNOSIS — H9042 Sensorineural hearing loss, unilateral, left ear, with unrestricted hearing on the contralateral side: Secondary | ICD-10-CM

## 2021-02-12 DIAGNOSIS — M542 Cervicalgia: Secondary | ICD-10-CM | POA: Diagnosis not present

## 2021-02-14 ENCOUNTER — Other Ambulatory Visit: Payer: Self-pay | Admitting: Family Medicine

## 2021-02-14 NOTE — Telephone Encounter (Signed)
Requested Prescriptions  Pending Prescriptions Disp Refills  . traZODone (DESYREL) 150 MG tablet [Pharmacy Med Name: TRAZODONE 150MG  (HUNDRED-FIFTY) TAB] 45 tablet 0    Sig: TAKE 1/2 TABLET(75 MG) BY MOUTH AT BEDTIME     Psychiatry: Antidepressants - Serotonin Modulator Passed - 02/14/2021  7:53 AM      Passed - Completed PHQ-2 or PHQ-9 in the last 360 days      Passed - Valid encounter within last 6 months    Recent Outpatient Visits          1 month ago Other [place], [place]   2 months ago Left elbow pain   [place], Kelby Aline, FNP   2 months ago Encounter for annual physical exam   [facility] [place], Dionne Bucy, MD   3 months ago Acute bacterial conjunctivitis of left eye   [facility] [place], Kelby Aline, FNP   4 months ago Chronic sinusitis, unspecified location   [facility] [place], Kelby Aline, FNP      Future Appointments            In 3 months Bacigalupo, Dionne Bucy, MD [facility] [place], [place]

## 2021-02-23 ENCOUNTER — Other Ambulatory Visit: Payer: Self-pay

## 2021-02-23 ENCOUNTER — Ambulatory Visit
Admission: RE | Admit: 2021-02-23 | Discharge: 2021-02-23 | Disposition: A | Discharge status: Home / Self Care | Payer: Medicaid Other | Source: Ambulatory Visit | Attending: Otolaryngology | Admitting: Otolaryngology

## 2021-02-23 DIAGNOSIS — H9042 Sensorineural hearing loss, unilateral, left ear, with unrestricted hearing on the contralateral side: Secondary | ICD-10-CM | POA: Diagnosis not present

## 2021-02-23 DIAGNOSIS — H9313 Tinnitus, bilateral: Secondary | ICD-10-CM | POA: Diagnosis not present

## 2021-02-23 DIAGNOSIS — H9311 Tinnitus, right ear: Secondary | ICD-10-CM | POA: Diagnosis not present

## 2021-02-23 DIAGNOSIS — H9193 Unspecified hearing loss, bilateral: Secondary | ICD-10-CM | POA: Diagnosis not present

## 2021-02-23 MED ORDER — GADOBUTROL 1 MMOL/ML IV SOLN
6.0000 mL | Freq: Once | INTRAVENOUS | Status: AC | PRN
  Administered 2021-02-23: 6 mL via INTRAVENOUS

## 2021-02-24 ENCOUNTER — Ambulatory Visit
Admission: RE | Admit: 2021-02-24 | Discharge: 2021-02-24 | Disposition: A | Discharge status: Home / Self Care | Payer: Medicaid Other | Source: Ambulatory Visit | Attending: Obstetrics and Gynecology | Admitting: Obstetrics and Gynecology

## 2021-02-24 DIAGNOSIS — Z1231 Encounter for screening mammogram for malignant neoplasm of breast: Secondary | ICD-10-CM | POA: Diagnosis not present

## 2021-03-12 DIAGNOSIS — H35713 Central serous chorioretinopathy, bilateral: Secondary | ICD-10-CM | POA: Diagnosis not present

## 2021-03-17 ENCOUNTER — Other Ambulatory Visit: Payer: Self-pay | Admitting: Family Medicine

## 2021-03-17 DIAGNOSIS — F411 Generalized anxiety disorder: Secondary | ICD-10-CM

## 2021-03-17 DIAGNOSIS — F331 Major depressive disorder, recurrent, moderate: Secondary | ICD-10-CM

## 2021-03-22 ENCOUNTER — Other Ambulatory Visit: Payer: Self-pay | Admitting: Family Medicine

## 2021-03-24 DIAGNOSIS — J301 Allergic rhinitis due to pollen: Secondary | ICD-10-CM | POA: Diagnosis not present

## 2021-03-24 DIAGNOSIS — H9012 Conductive hearing loss, unilateral, left ear, with unrestricted hearing on the contralateral side: Secondary | ICD-10-CM | POA: Diagnosis not present

## 2021-03-24 DIAGNOSIS — H9311 Tinnitus, right ear: Secondary | ICD-10-CM | POA: Diagnosis not present

## 2021-03-29 ENCOUNTER — Ambulatory Visit: Payer: Self-pay

## 2021-03-29 ENCOUNTER — Telehealth: Payer: Medicaid Other | Admitting: Physician Assistant

## 2021-03-29 DIAGNOSIS — B379 Candidiasis, unspecified: Secondary | ICD-10-CM

## 2021-03-29 DIAGNOSIS — T3695XA Adverse effect of unspecified systemic antibiotic, initial encounter: Secondary | ICD-10-CM | POA: Diagnosis not present

## 2021-03-29 DIAGNOSIS — J0141 Acute recurrent pansinusitis: Secondary | ICD-10-CM | POA: Diagnosis not present

## 2021-03-29 MED ORDER — FLUCONAZOLE 150 MG PO TABS: 150.0000 mg | ORAL_TABLET | Freq: Once | ORAL | 0 refills | Status: AC

## 2021-03-29 MED ORDER — DOXYCYCLINE HYCLATE 100 MG PO TABS: 100.0000 mg | ORAL_TABLET | Freq: Two times a day (BID) | ORAL | 0 refills | Status: DC

## 2021-03-29 NOTE — Progress Notes (Signed)
We are sorry that you are not feeling well.  Here is how we plan to help!  Based on what you have shared with me it looks like you have sinusitis.  Sinusitis is inflammation and infection in the sinus cavities of the head.  Based on your presentation I believe you most likely have Acute Bacterial Sinusitis.  This is an infection caused by bacteria and is treated with antibiotics. I have prescribed Doxycycline 100mg  by mouth twice a day for 10 days. You may use an oral decongestant such as Mucinex D or if you have glaucoma or high blood pressure use plain Mucinex. Saline nasal spray help and can safely be used as often as needed for congestion.  If you develop worsening sinus pain, fever or notice severe headache and vision changes, or if symptoms are not better after completion of antibiotic, please schedule an appointment with a health care provider.    Sinus infections are not as easily transmitted as other respiratory infection, however we still recommend that you avoid close contact with loved ones, especially the very young and elderly.  Remember to wash your hands thoroughly throughout the day as this is the number one way to prevent the spread of infection!  Home Care:  Only take medications as instructed by your medical team.  Complete the entire course of an antibiotic.  Do not take these medications with alcohol.  A steam or ultrasonic humidifier can help congestion.  You can place a towel over your head and breathe in the steam from hot water coming from a faucet.  Avoid close contacts especially the very young and the elderly.  Cover your mouth when you cough or sneeze.  Always remember to wash your hands.  Get Help Right Away If:  You develop worsening fever or sinus pain.  You develop a severe head ache or visual changes.  Your symptoms persist after you have completed your treatment plan.  Make sure you  Understand these instructions.  Will watch your  condition.  Will get help right away if you are not doing well or get worse.  Your e-visit answers were reviewed by a board certified advanced clinical practitioner to complete your personal care plan.  Depending on the condition, your plan could have included both over the counter or prescription medications.  If there is a problem please reply  once you have received a response from your provider.  Your safety is important to [place].  If you have drug allergies check your prescription carefully.    You can use MyChart to ask questions about today's visit, request a non-urgent call back, or ask for a work or school excuse for 24 hours related to this e-Visit. If it has been greater than 24 hours you will need to follow up with your provider, or enter a new e-Visit to address those concerns.  You will get an e-mail in the next two days asking about your experience.  I hope that your e-visit has been valuable and will speed your recovery. Thank you for using e-visits.  I provided 6 minutes of non face-to-face time during this encounter for chart review and documentation.

## 2021-03-29 NOTE — Telephone Encounter (Signed)
Pt. Started having sinus pain, pressure and facial swelling Thursday 03/25/21. Has green discharge from nose and pain behind her eyes. No availability this week per [facility] agent.Pt. asking for antibiotic to be called in. Pt. Will try My Chart tele health visit.  Answer Assessment - Initial Assessment Questions 1. LOCATION: "Where does it hurt?"      Facial pain 2. ONSET: "When did the sinus pain start?"  (e.g., hours, days)      Thursday 03/25/21 3. SEVERITY: "How bad is the pain?"   (Scale 1-10; mild, moderate or severe)   - MILD (1-3): doesn't interfere with normal activities    - MODERATE (4-7): interferes with normal activities (e.g., work or school) or awakens from sleep   - SEVERE (8-10): excruciating pain and patient unable to do any normal activities        Moderate 4. RECURRENT SYMPTOM: "Have you ever had sinus problems before?" If Yes, ask: "When was the last time?" and "What happened that time?"      Yes 5. NASAL CONGESTION: "Is the nose blocked?" If Yes, ask: "Can you open it or must you breathe through the mouth?"     Yes 6. NASAL DISCHARGE: "Do you have discharge from your nose?" If so ask, "What color?"     Green 7. FEVER: "Do you have a fever?" If Yes, ask: "What is it, how was it measured, and when did it start?"      No 8. OTHER SYMPTOMS: "Do you have any other symptoms?" (e.g., sore throat, cough, earache, difficulty breathing)     No 9. PREGNANCY: "Is there any chance you are pregnant?" "When was your last menstrual period?"     No  Protocols used: SINUS PAIN OR CONGESTION-A-AH

## 2021-03-31 DIAGNOSIS — H35712 Central serous chorioretinopathy, left eye: Secondary | ICD-10-CM | POA: Diagnosis not present

## 2021-03-31 DIAGNOSIS — J305 Allergic rhinitis due to food: Secondary | ICD-10-CM | POA: Diagnosis not present

## 2021-04-02 ENCOUNTER — Other Ambulatory Visit: Payer: Self-pay | Admitting: Family Medicine

## 2021-04-02 DIAGNOSIS — F331 Major depressive disorder, recurrent, moderate: Secondary | ICD-10-CM

## 2021-04-02 DIAGNOSIS — F411 Generalized anxiety disorder: Secondary | ICD-10-CM

## 2021-04-05 ENCOUNTER — Encounter: Payer: Self-pay | Admitting: Family Medicine

## 2021-04-06 ENCOUNTER — Other Ambulatory Visit: Payer: Self-pay

## 2021-04-06 MED ORDER — CEPHALEXIN 500 MG PO CAPS: ORAL_CAPSULE | ORAL | 0 refills | Status: DC

## 2021-04-06 NOTE — Telephone Encounter (Signed)
Prescription sent to pharmacy and patient has been notified. 

## 2021-04-13 DIAGNOSIS — J301 Allergic rhinitis due to pollen: Secondary | ICD-10-CM | POA: Diagnosis not present

## 2021-04-16 ENCOUNTER — Other Ambulatory Visit: Payer: Self-pay | Admitting: Family Medicine

## 2021-04-16 ENCOUNTER — Encounter: Payer: Self-pay | Admitting: Family Medicine

## 2021-04-16 DIAGNOSIS — F411 Generalized anxiety disorder: Secondary | ICD-10-CM

## 2021-04-16 DIAGNOSIS — F331 Major depressive disorder, recurrent, moderate: Secondary | ICD-10-CM

## 2021-04-19 ENCOUNTER — Telehealth: Payer: Self-pay

## 2021-04-19 MED ORDER — LISDEXAMFETAMINE DIMESYLATE 50 MG PO CAPS
50.0000 mg | ORAL_CAPSULE | Freq: Every day | ORAL | 0 refills | Status: DC
Start: 2021-04-19 — End: 2021-05-06

## 2021-04-19 MED ORDER — BUPROPION HCL 75 MG PO TABS: ORAL_TABLET | ORAL | 0 refills | Status: DC

## 2021-04-19 MED ORDER — TRAZODONE HCL 150 MG PO TABS: 75.0000 mg | ORAL_TABLET | Freq: Every evening | ORAL | 5 refills | Status: DC | PRN

## 2021-04-19 NOTE — Telephone Encounter (Signed)
Copied from [place] [id number]. Topic: Appointment Scheduling - Scheduling Inquiry for Clinic >> Apr 19, 2021 10:28 AM Tessa Lerner A wrote: Reason for CRM: Patient would like to be contacted regarding rescheduling their Physical appt  Patient also has concerns related to scheduling a medication follow up visit as well due to pharmacy issues  Agent was unable to successfully schedule an office visit or reschedule physical visit at the time of call  Please contact to further advise when possible

## 2021-04-20 NOTE — Telephone Encounter (Signed)
LMTCB

## 2021-04-21 ENCOUNTER — Telehealth: Payer: Self-pay

## 2021-04-21 MED ORDER — CEPHALEXIN 500 MG PO CAPS: ORAL_CAPSULE | ORAL | 0 refills | Status: DC

## 2021-04-21 NOTE — Telephone Encounter (Signed)
Copied from [place] [id number]. Topic: Quick Communication - Rx Refill/Question >> Apr 21, 2021  2:20 PM Erick Blinks wrote: Pt called to report that she needs an antibiotic called in before her dental procedure tomorrow, cephALEXin (KEFLEX) 500 MG capsule  [facility] [address] [place] [address] Phone: [phone] Fax: [phone]

## 2021-04-21 NOTE — Telephone Encounter (Signed)
Patient of Dr. Brita Romp please review message and advise. KW

## 2021-04-21 NOTE — Telephone Encounter (Signed)
Ok to send in--4 1 hour before procedure

## 2021-04-22 NOTE — Telephone Encounter (Signed)
Appt has been scheduled.

## 2021-04-23 DIAGNOSIS — H35712 Central serous chorioretinopathy, left eye: Secondary | ICD-10-CM | POA: Diagnosis not present

## 2021-04-23 DIAGNOSIS — H35711 Central serous chorioretinopathy, right eye: Secondary | ICD-10-CM | POA: Diagnosis not present

## 2021-04-27 ENCOUNTER — Other Ambulatory Visit: Payer: Self-pay

## 2021-04-27 DIAGNOSIS — F411 Generalized anxiety disorder: Secondary | ICD-10-CM

## 2021-04-27 DIAGNOSIS — F331 Major depressive disorder, recurrent, moderate: Secondary | ICD-10-CM

## 2021-04-27 MED ORDER — BUPROPION HCL 75 MG PO TABS: ORAL_TABLET | ORAL | 0 refills | Status: DC

## 2021-05-04 ENCOUNTER — Other Ambulatory Visit: Payer: Self-pay

## 2021-05-04 ENCOUNTER — Ambulatory Visit (INDEPENDENT_AMBULATORY_CARE_PROVIDER_SITE_OTHER): Payer: Medicaid Other | Admitting: Family Medicine

## 2021-05-04 ENCOUNTER — Encounter: Payer: Self-pay | Admitting: Family Medicine

## 2021-05-04 VITALS — BP 124/86 | HR 99 | Wt 143.0 lb

## 2021-05-04 DIAGNOSIS — F902 Attention-deficit hyperactivity disorder, combined type: Secondary | ICD-10-CM

## 2021-05-04 DIAGNOSIS — E039 Hypothyroidism, unspecified: Secondary | ICD-10-CM

## 2021-05-04 DIAGNOSIS — I7300 Raynaud's syndrome without gangrene: Secondary | ICD-10-CM

## 2021-05-04 DIAGNOSIS — R5382 Chronic fatigue, unspecified: Secondary | ICD-10-CM

## 2021-05-04 DIAGNOSIS — Z862 Personal history of diseases of the blood and blood-forming organs and certain disorders involving the immune mechanism: Secondary | ICD-10-CM

## 2021-05-04 DIAGNOSIS — F909 Attention-deficit hyperactivity disorder, unspecified type: Secondary | ICD-10-CM | POA: Diagnosis not present

## 2021-05-04 NOTE — Progress Notes (Signed)
Established patient visit      Patient: [name]   DOB: 03-26-1976   45 y.o. Female  MRN: [id number]  Visit Date: 05/04/2021    Today's healthcare provider: Vernie Murders, PA-C     No chief complaint on file.    Subjective      HPI   Patient is a 45 year old female who states she is here for her routine  follow up, medication refill and has numerous complaints.    She states she is fatigued all the time and believes she is anemia,  macrocytic anemia.    She complains of having 3 episodes of gout in her right middle finger.  She would like her thyroid checked.  Has history of abnormal Prolactin,  cortisol and T3.  Has increasingly worsening decreased concentration.    Elevated heart rate  Brittle nails     Past Medical History:   Diagnosis Date    ADD (attention deficit disorder)     ADHD     Bone spur     "front of spine" - effects right shoulder (sometimes)    Central serous chorioretinopathy, right eye     Depression     Deviated nasal septum     Dysplastic nevus 08/21/2019    Right lower abdomen. Moderate atypia, margins free.    History of sinus surgery 09/28/2020    Hypothyroidism     Nondisplaced fracture of fifth left metatarsal bone     PTSD (post-traumatic stress disorder)     RA (rheumatoid arthritis) ([place]) 2016    positive ANA    Rheumatoid arthritis ([place]) 12/09/2016    Sinusitis      Past Surgical History:   Procedure Laterality Date    ARTHROPLASTY  10/02/2020    C5-6 C6-7     BACK SURGERY      CESAREAN SECTION      DILATION AND CURETTAGE OF UTERUS      (x2)    FRONTAL SINUS EXPLORATION Bilateral 11/19/2015    Procedure: FRONTAL SINUS EXPLORATION;  Surgeon: Margaretha Sheffield, MD;   Location: [facility];  Service: ENT;  Laterality: Bilateral;    IMAGE GUIDED SINUS SURGERY N/A 11/19/2015    Procedure: IMAGE GUIDED SINUS SURGERY;  Surgeon: Margaretha Sheffield, MD;   Location: [facility];  Service: ENT;  Laterality: N/A;  GAVE DISK   TO CE CE 09/22    MAXILLARY ANTROSTOMY Bilateral 11/19/2015    Procedure: MAXILLARY ANTROSTOMY;  Surgeon: Margaretha Sheffield, MD;  Location:  [facility];  Service: ENT;  Laterality: Bilateral;    SEPTOPLASTY N/A 11/19/2015    Procedure: SEPTOPLASTY;  Surgeon: Margaretha Sheffield, MD;  Location: [facility];  Service: ENT;  Laterality: N/A;    SPHENOIDECTOMY Bilateral 11/19/2015    Procedure: Coralee Pesa;  Surgeon: Margaretha Sheffield, MD;  Location: [facility];  Service: ENT;  Laterality: Bilateral;    TONSILLECTOMY       Social History     Tobacco Use    Smoking status: Never    Smokeless tobacco: Never   Vaping Use    Vaping Use: Never used   Substance Use Topics    Alcohol use: Yes     Alcohol/week: 0.0 standard drinks     Comment: rare- wine     Drug use: Never     Family Status   Relation Name Status    Mother  Deceased    PGF  (  Not Specified)    Cousin  (Not Specified)    Father  Alive    Brother  Alive    Son  Alive    Daughter  Alive        legally blind in left eye     Neg Hx  (Not Specified)     Allergies   Allergen Reactions    Penicillins Swelling     Pt states caused throat to swell      Sulfa Antibiotics Other (See Comments)     Lips tingled           Medications:  Outpatient Medications Prior to Visit   Medication Sig    albuterol (ACCUNEB) 0.63 MG/3ML nebulizer solution Take 1 ampule by  nebulization every 6 (six) hours as needed for wheezing.    AUROVELA FE 1.5/30 1.5-30 MG-MCG tablet Take 1 tablet by mouth daily.    buPROPion (WELLBUTRIN) 75 MG tablet TAKE 2 TABLETS BY MOUTH EVERY MORNING  AND 1 TABLET EVERY DAY AFTER LUNCH    eplerenone (INSPRA) 25 MG tablet Take 1 tablet by mouth 2 (two) times  daily.    estradiol (ESTRACE) 1 MG tablet Take 1 mg by mouth daily.    fexofenadine (ALLEGRA) 180 MG tablet Take 180 mg by mouth daily.    LINZESS 145 MCG CAPS capsule Take 145 mcg by mouth daily.    lisdexamfetamine (VYVANSE) 50 MG capsule Take  1 capsule (50 mg total) by  mouth daily.    lisdexamfetamine (VYVANSE) 50 MG capsule Take 1 capsule (50 mg total) by  mouth daily.    lisdexamfetamine (VYVANSE) 50 MG capsule Take 1 capsule (50 mg total) by  mouth daily.    methocarbamol (ROBAXIN) 500 MG tablet Take by mouth.    mometasone (ELOCON) 0.1 % cream Apply 1 application topically daily.    montelukast (SINGULAIR) 10 MG tablet Take 10 mg by mouth at bedtime.    naratriptan (AMERGE) 2.5 MG tablet Take 1 tablet (2.5 mg total) by mouth  as needed for migraine. Take one (1) tablet at onset of headache; if  returns or does not resolve, may repeat after 4 hours; do not exceed five  (5) mg in 24 hours.    PROAIR HFA 108 (90 Base) MCG/ACT inhaler SMARTSIG:1-2 Puff(s) Via Inhaler  Every 6 Hours PRN    SUMAtriptan (IMITREX) 100 MG tablet TK 1 T PO AOS OF HEADACHE. REPEAT IN  2 H AS NEEDED. MAX DOSE 2 T IN 24 H    traZODone (DESYREL) 150 MG tablet Take 0.5-1 tablets (75-150 mg total) by  mouth at bedtime as needed for sleep.    cephALEXin (KEFLEX) 500 MG capsule Take 4 Capsules 1 Hour prior to  procedure    doxycycline (VIBRA-TABS) 100 MG tablet Take 1 tablet (100 mg total) by  mouth 2 (two) times daily.    methylPREDNISolone (MEDROL DOSEPAK) 4 MG TBPK tablet Take according to  the package insert    Norethindrone Acet-Ethinyl Est (AUROVELA 1.5/30 PO) Take by mouth.     No facility-administered medications prior to visit.       Review of Systems   Constitutional:  Positive for fatigue.   HENT: Negative.     Respiratory: Negative.     Cardiovascular: Negative.    Gastrointestinal: Negative.    Endocrine: Negative.    Genitourinary: Negative.    Musculoskeletal: Negative.          Objective      BP 124/86 (BP Location:  Right Arm, Patient Position: Sitting, Cuff Size:  Normal)   Pulse 99   Wt 143 lb (64.9 kg)   SpO2 100%   BMI 22.40 kg/m         Physical Exam  Constitutional:       General: She is not in acute distress.      Appearance: She is well-developed.   HENT:      Head: Normocephalic and atraumatic.      Right Ear: Hearing normal.      Left Ear: Hearing normal.      Nose: Nose normal.   Eyes:      General: Lids are normal. No scleral icterus.        Right eye: No discharge.         Left eye: No discharge.      Conjunctiva/sclera: Conjunctivae normal.   Cardiovascular:      Rate and Rhythm: Normal rate and regular rhythm.      Heart sounds: Normal heart sounds.   Pulmonary:      Effort: Pulmonary effort is normal. No respiratory distress.      Breath sounds: Normal breath sounds.   Abdominal:      General: Bowel sounds are normal.      Palpations: Abdomen is soft.   Musculoskeletal:         General: Normal range of motion.      Cervical back: Normal range of motion and neck supple.   Skin:     Findings: No lesion or rash.   Neurological:      Mental Status: She is alert and oriented to person, place, and time.   Psychiatric:         Speech: Speech normal.         Behavior: Behavior normal.         Thought Content: Thought content normal.          No results found for any visits on 05/04/21.   Assessment & Plan        1. Hypothyroidism, unspecified type  Was on Cytomel 5 mcg qd in the past. Concerned chronic fatigue is an  indication it is needed again. Recheck labs.  - Thyroid Panel With TSH  - Thyroid peroxidase antibody  - Comprehensive metabolic panel  - CBC with Differential/Platelet    2. Chronic fatigue  History of anemia and thyroid function changes in the past. Will check for  deficiency of iron, Vitamin D, Vitamin B12 or thyroiditis at her request.  - Thyroid peroxidase antibody  - CBC with Differential/Platelet  - VITAMIN D 25 Hydroxy (Vit-D Deficiency, Fractures)  - Vitamin B12  - Iron  - Ferritin    3. Attention deficit hyperactivity disorder (ADHD), combined type  Presently on Vyvance 50 mg qd. Recheck routine labs and follow up with Dr.  Brita Romp.  - Thyroid  Panel With TSH  - Comprehensive metabolic panel  - CBC with Differential/Platelet    4. Raynaud's disease without gangrene  - Thyroid Panel With TSH  - Comprehensive metabolic panel  - CBC with Differential/Platelet  - Vitamin B12    5. History of macrocytosis  - CBC with Differential/Platelet  - Vitamin B12      No follow-ups on file.       I,  , PA-C, have reviewed all documentation for this visit.  The documentation on 07/04/21 for the exam, diagnosis, procedures, and  orders are all accurate and complete.  Vernie Murders, PA-C   [employer]  [phone] (phone)  [phone] (fax)    [place]

## 2021-05-05 ENCOUNTER — Telehealth: Payer: Self-pay

## 2021-05-05 LAB — COMPREHENSIVE METABOLIC PANEL
ALT: 13 IU/L (ref 0–32)
AST: 16 IU/L (ref 0–40)
Albumin/Globulin Ratio: 2.4 — ABNORMAL HIGH (ref 1.2–2.2)
Albumin: 4.6 g/dL (ref 3.8–4.8)
Alkaline Phosphatase: 51 IU/L (ref 44–121)
BUN/Creatinine Ratio: 15 (ref 9–23)
BUN: 16 mg/dL (ref 6–24)
Bilirubin Total: 0.5 mg/dL (ref 0.0–1.2)
CO2: 21 mmol/L (ref 20–29)
Calcium: 9.2 mg/dL (ref 8.7–10.2)
Chloride: 97 mmol/L (ref 96–106)
Creatinine, Ser: 1.1 mg/dL — ABNORMAL HIGH (ref 0.57–1.00)
Globulin, Total: 1.9 g/dL (ref 1.5–4.5)
Glucose: 88 mg/dL (ref 65–99)
Potassium: 4.5 mmol/L (ref 3.5–5.2)
Sodium: 134 mmol/L (ref 134–144)
Total Protein: 6.5 g/dL (ref 6.0–8.5)
eGFR: 63 mL/min/{1.73_m2} (ref >59)

## 2021-05-05 LAB — CBC WITH DIFFERENTIAL/PLATELET
Basophils Absolute: 0 10*3/uL (ref 0.0–0.2)
Basos: 1 %
EOS (ABSOLUTE): 0.1 10*3/uL (ref 0.0–0.4)
Eos: 1 %
Hematocrit: 39.9 % (ref 34.0–46.6)
Hemoglobin: 13.3 g/dL (ref 11.1–15.9)
Immature Grans (Abs): 0 10*3/uL (ref 0.0–0.1)
Immature Granulocytes: 0 %
Lymphocytes Absolute: 1.5 10*3/uL (ref 0.7–3.1)
Lymphs: 28 %
MCH: 32.9 pg (ref 26.6–33.0)
MCHC: 33.3 g/dL (ref 31.5–35.7)
MCV: 99 fL — ABNORMAL HIGH (ref 79–97)
Monocytes Absolute: 0.3 10*3/uL (ref 0.1–0.9)
Monocytes: 6 %
Neutrophils Absolute: 3.5 10*3/uL (ref 1.4–7.0)
Neutrophils: 64 %
Platelets: 270 10*3/uL (ref 150–450)
RBC: 4.04 x10E6/uL (ref 3.77–5.28)
RDW: 10.9 % — ABNORMAL LOW (ref 11.7–15.4)
WBC: 5.4 10*3/uL (ref 3.4–10.8)

## 2021-05-05 LAB — THYROID PANEL WITH TSH
Free Thyroxine Index: 1.7 (ref 1.2–4.9)
T3 Uptake Ratio: 21 % — ABNORMAL LOW (ref 24–39)
T4, Total: 8.2 ug/dL (ref 4.5–12.0)
TSH: 2.63 u[IU]/mL (ref 0.450–4.500)

## 2021-05-05 LAB — FERRITIN: Ferritin: 57 ng/mL (ref 15–150)

## 2021-05-05 LAB — IRON: Iron: 173 ug/dL — ABNORMAL HIGH (ref 27–159)

## 2021-05-05 LAB — THYROID PEROXIDASE ANTIBODY: Thyroperoxidase Ab SerPl-aCnc: <8 IU/mL (ref 0–34)

## 2021-05-05 LAB — VITAMIN D 25 HYDROXY (VIT D DEFICIENCY, FRACTURES): Vit D, 25-Hydroxy: 62.8 ng/mL (ref 30.0–100.0)

## 2021-05-05 LAB — VITAMIN B12: Vitamin B-12: 336 pg/mL (ref 232–1245)

## 2021-05-05 NOTE — Telephone Encounter (Signed)
See result note.  

## 2021-05-05 NOTE — Telephone Encounter (Signed)
Copied from [place] [id number]. Topic: General - Other >> May 05, 2021 11:44 AM Antonieta Iba C wrote: Reason for CRM: pt called in for her lab results. Pt has viewed them via mychart. Pt would like to be advised further.   Please assist.  Labs need to view by a provider and once they are sign off we will call patient with results.

## 2021-05-06 ENCOUNTER — Encounter: Payer: Self-pay | Admitting: Family Medicine

## 2021-05-06 MED ORDER — LISDEXAMFETAMINE DIMESYLATE 50 MG PO CAPS: 50.0000 mg | ORAL_CAPSULE | Freq: Every day | ORAL | 0 refills | Status: DC

## 2021-05-11 ENCOUNTER — Other Ambulatory Visit: Payer: Self-pay

## 2021-05-11 ENCOUNTER — Encounter: Payer: Self-pay | Admitting: Family Medicine

## 2021-05-11 ENCOUNTER — Ambulatory Visit: Payer: Medicaid Other | Admitting: Family Medicine

## 2021-05-11 VITALS — BP 120/91 | HR 100 | Temp 98.7°F | Resp 16 | Ht 67.0 in | Wt 142.0 lb

## 2021-05-11 DIAGNOSIS — R4189 Other symptoms and signs involving cognitive functions and awareness: Secondary | ICD-10-CM | POA: Diagnosis not present

## 2021-05-11 DIAGNOSIS — M1A0410 Idiopathic chronic gout, right hand, without tophus (tophi): Secondary | ICD-10-CM | POA: Diagnosis not present

## 2021-05-11 DIAGNOSIS — Z23 Encounter for immunization: Secondary | ICD-10-CM | POA: Diagnosis not present

## 2021-05-11 DIAGNOSIS — R5382 Chronic fatigue, unspecified: Secondary | ICD-10-CM | POA: Insufficient documentation

## 2021-05-11 DIAGNOSIS — E039 Hypothyroidism, unspecified: Secondary | ICD-10-CM

## 2021-05-11 MED ORDER — FLUCONAZOLE 150 MG PO TABS: 150.0000 mg | ORAL_TABLET | Freq: Once | ORAL | 0 refills | Status: AC

## 2021-05-11 NOTE — Progress Notes (Signed)
Established patient visit   Patient: [name]   DOB: 14-Mar-1976   45 y.o. Female  MRN: [id number] Visit Date: 05/11/2021  Today's healthcare provider: Lavon Paganini, MD   Chief Complaint  Patient presents with  . Hypothyroidism   Subjective    HPI   Gout  She has had goat in her finger 3x and 2 month ago she has had 2 flare-ups. When she has flare-ups her right middle finger is red, swollen and sensitive to touch.   Dizziness/Fatigue  She also experienced extreme fatigue for about a week. She was on the treadmill for 1 min and she felt exhausted. She was dizzy and exhausted. She has difficulty sleeping sometimes. She says her extreme fatigue began around the time she had flare-ups.   Memory Loss She repots shes having some confusion and memory issues while reading. Her symptoms worsens when she is tired and she is unsure what her symptoms can be associated with. She is requesting to consult neurology.   Yeast infection She is requesting for a refill on diflucan for her yeast infection.   Follow up for labs  The patient was last seen for this 1 weeks ago. Changes made at last visit include check labs.  She reports excellent compliance with treatment. She feels that condition is Improved. She is not having side effects.  She is concerned about the results of her labs.   She reports that her creatinine levels are always slightly elevated.  She denies taking medication for her hypothyroidism.    -----------------------------------------------------------------------------------------  Patient Active Problem List   Diagnosis Date Noted  . Chronic fatigue 05/11/2021  . Chronic gout of right hand 05/11/2021  . Brain fog 05/11/2021  . Contusion of left elbow 11/30/2020  . Fall 11/30/2020  . Left elbow pain 11/30/2020  . Bacterial conjunctivitis 10/26/2020  . 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 Cervical radiculitis  05/13/2020  . Sprain of interphalangeal joint of finger 02/19/2020  . Chronic constipation 12/31/2019  . Deviated nasal septum 09/24/2019  . 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 Chronic rhinitis 09/24/2019  . GAD (generalized anxiety disorder) 03/15/2019  . Moderate episode of recurrent major depressive disorder ([place]) 03/15/2019  . Rheumatoid arthritis ([place]) 11/01/2018  . Primary osteoarthritis of both hands 05/11/2018  . Primary osteoarthritis of both feet 05/11/2018  . Raynaud's disease without gangrene 05/11/2018  . Asthma 04/20/2018  . Family history of rheumatoid arthritis 04/10/2018  . Dry mouth 01/23/2018  . PTSD (post-traumatic stress disorder) 10/16/2017  . Insomnia 10/16/2017  . Migraines 10/16/2017  . ADHD (attention deficit hyperactivity disorder)   . Uterine leiomyoma 09/22/2017  . 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 Hypothyroidism 03/30/2017  . Polyarthralgia 12/09/2016  . Cervical spondylosis with myelopathy 06/12/2015   Social History   Tobacco Use  . Smoking status: Never Smoker  . Smokeless tobacco: Never Used  Vaping Use  . Vaping Use: Never used  Substance Use Topics  . Alcohol use: Yes    Alcohol/week: 0.0 standard drinks    Comment: rare- wine   . Drug use: Never   Allergies  Allergen Reactions  . Penicillins Swelling    Pt states caused throat to swell   . Sulfa Antibiotics Other (See Comments)    Lips tingled       Medications: Outpatient Medications Prior to Visit  Medication Sig  . albuterol (ACCUNEB) 0.63 MG/3ML nebulizer solution Take 1 ampule by nebulization every  6 (six) hours as needed for wheezing.  Rosalia Hammers FE 1.5/30 1.5-30 MG-MCG tablet Take 1 tablet by mouth daily.  Marland Kitchen buPROPion (WELLBUTRIN) 75 MG tablet TAKE 2 TABLETS BY MOUTH EVERY MORNING AND 1 TABLET EVERY DAY AFTER LUNCH  . eplerenone (INSPRA) 25 MG tablet Take 1 tablet by mouth 2 (two) times daily.  Marland Kitchen estradiol (ESTRACE) 1 MG tablet Take 1 mg by mouth daily.  .  fexofenadine (ALLEGRA) 180 MG tablet Take 180 mg by mouth daily.  Marland Kitchen LINZESS 145 MCG CAPS capsule Take 145 mcg by mouth daily.  Marland Kitchen lisdexamfetamine (VYVANSE) 50 MG capsule Take 1 capsule (50 mg total) by mouth daily.  Marland Kitchen lisdexamfetamine (VYVANSE) 50 MG capsule Take 1 capsule (50 mg total) by mouth daily.  Marland Kitchen lisdexamfetamine (VYVANSE) 50 MG capsule Take 1 capsule (50 mg total) by mouth daily.  . methocarbamol (ROBAXIN) 500 MG tablet Take by mouth.  . mometasone (ELOCON) 0.1 % cream Apply 1 application topically daily.  . montelukast (SINGULAIR) 10 MG tablet Take 10 mg by mouth at bedtime.  . naratriptan (AMERGE) 2.5 MG tablet Take 1 tablet (2.5 mg total) by mouth as needed for migraine. Take one (1) tablet at onset of headache; if returns or does not resolve, may repeat after 4 hours; do not exceed five (5) mg in 24 hours.  Marland Kitchen PROAIR HFA 108 (90 Base) MCG/ACT inhaler SMARTSIG:1-2 Puff(s) Via Inhaler Every 6 Hours PRN  . SUMAtriptan (IMITREX) 100 MG tablet TK 1 T PO AOS OF HEADACHE. REPEAT IN 2 H AS NEEDED. MAX DOSE 2 T IN 24 H  . traZODone (DESYREL) 150 MG tablet Take 0.5-1 tablets (75-150 mg total) by mouth at bedtime as needed for sleep.  . [DISCONTINUED] cephALEXin (KEFLEX) 500 MG capsule Take 4 Capsules 1 Hour prior to procedure  . [DISCONTINUED] doxycycline (VIBRA-TABS) 100 MG tablet Take 1 tablet (100 mg total) by mouth 2 (two) times daily.  . [DISCONTINUED] methylPREDNISolone (MEDROL DOSEPAK) 4 MG TBPK tablet Take according to the package insert  . [DISCONTINUED] Norethindrone Acet-Ethinyl Est (AUROVELA 1.5/30 PO) Take by mouth.   No facility-administered medications prior to visit.    Review of Systems  Constitutional: Negative for activity change, appetite change, chills, fatigue and fever.  HENT: Negative for ear pain, nosebleeds, sinus pressure, sinus pain and sore throat.   Eyes: Negative for pain.  Respiratory: Negative for cough, chest tightness, shortness of breath and wheezing.    Cardiovascular: Negative for chest pain, palpitations and leg swelling.  Gastrointestinal: Negative for abdominal pain, blood in stool, constipation, diarrhea, nausea and vomiting.  Genitourinary: Negative for flank pain, frequency, pelvic pain and urgency.  Musculoskeletal: Negative for back pain, neck pain and neck stiffness.  Neurological: Negative for dizziness, syncope, weakness, light-headedness, numbness and headaches.    Last CBC Lab Results  Component Value Date   WBC 5.4 05/04/2021   HGB 13.3 05/04/2021   HCT 39.9 05/04/2021   MCV 99 (H) 05/04/2021   MCH 32.9 05/04/2021   RDW 10.9 (L) 05/04/2021   PLT 270 16/09/9603   Last metabolic panel Lab Results  Component Value Date   GLUCOSE 88 05/04/2021   NA 134 05/04/2021   K 4.5 05/04/2021   CL 97 05/04/2021   CO2 21 05/04/2021   BUN 16 05/04/2021   CREATININE 1.10 (H) 05/04/2021   GFRNONAA 70 11/17/2020   GFRAA 81 11/17/2020   CALCIUM 9.2 05/04/2021   PHOS 2.9 02/19/2016   PROT 6.5 05/04/2021   ALBUMIN 4.6 05/04/2021  LABGLOB 1.9 05/04/2021   AGRATIO 2.4 (H) 05/04/2021   BILITOT 0.5 05/04/2021   ALKPHOS 51 05/04/2021   AST 16 05/04/2021   ALT 13 05/04/2021   ANIONGAP 9 06/13/2017   Last lipids Lab Results  Component Value Date   CHOL 234 (H) 11/17/2020   HDL 72 11/17/2020   LDLCALC 146 (H) 11/17/2020   TRIG 94 11/17/2020   CHOLHDL 3.3 11/17/2020   Last hemoglobin A1c No results found for: HGBA1C Last thyroid functions Lab Results  Component Value Date   TSH 2.630 05/04/2021   T4TOTAL 8.2 05/04/2021   Last vitamin D Lab Results  Component Value Date   VD25OH 62.8 05/04/2021   Last vitamin B12 and Folate Lab Results  Component Value Date   VITAMINB12 336 05/04/2021   FOLATE >20.0 05/18/2017       Objective    BP (!) 120/91 (BP Location: Right Arm, Patient Position: Sitting, Cuff Size: Normal)   Pulse 100   Temp 98.7 F (37.1 C) (Oral)   Resp 16   Ht 5\' 7"  (1.702 m)   Wt 142 lb (64.4  kg)   SpO2 100%   BMI 22.24 kg/m  BP Readings from Last 3 Encounters:  05/11/21 (!) 120/91  05/04/21 124/86  11/30/20 124/82   Wt Readings from Last 3 Encounters:  05/11/21 142 lb (64.4 kg)  05/04/21 143 lb (64.9 kg)  11/30/20 150 lb 3.2 oz (68.1 kg)       Physical Exam Vitals reviewed.  Constitutional:      General: She is not in acute distress.    Appearance: Normal appearance. She is well-developed. She is not diaphoretic.  HENT:     Head: Normocephalic and atraumatic.  Eyes:     General: No scleral icterus.    Conjunctiva/sclera: Conjunctivae normal.  Neck:     Thyroid: No thyromegaly.  Cardiovascular:     Rate and Rhythm: Normal rate and regular rhythm.     Pulses: Normal pulses.     Heart sounds: Normal heart sounds. No murmur heard.   Pulmonary:     Effort: Pulmonary effort is normal. No respiratory distress.     Breath sounds: Normal breath sounds. No wheezing, rhonchi or rales.  Musculoskeletal:     Cervical back: Neck supple.     Right lower leg: No edema.     Left lower leg: No edema.  Lymphadenopathy:     Cervical: No cervical adenopathy.  Skin:    General: Skin is warm and dry.     Findings: No rash.  Neurological:     Mental Status: She is alert and oriented to person, place, and time. Mental status is at baseline.  Psychiatric:        Mood and Affect: Mood normal.        Behavior: Behavior normal.      No results found for any visits on 05/11/21.  Assessment & Plan     Problem List Items Addressed This Visit      Endocrine   Hypothyroidism    Previously well controlled Off of Synthroid for more than 1 year Reviewed recent TSH and other thyroid labs that show no abnormalities No need for medications at this time        Musculoskeletal and Integument   Chronic gout of right hand    Reports 3 episodes of acute gout in her right middle finger over the last year Previously followed by rheumatology for possible RA, so referred back to  them  Relevant Orders   Ambulatory referral to Rheumatology     Other   Chronic fatigue - Primary    Longstanding but worsening Reviewed recent labs with patient Discussed that this is not related to anemia or thyroid issues Her B12 level has decreased and her MCV has increased, so she may benefit from a B12 supplement      Brain fog    New problem Referral to neurology for evaluation      Relevant Orders   Ambulatory referral to Neurology    Other Visit Diagnoses    Need for Tdap vaccination       Relevant Orders   Tdap vaccine greater than or equal to 7yo IM (Completed)       Return in about 3 months (around 08/11/2021) for as scheduled.       I,Essence Turner,acting as a Education administrator for Lavon Paganini, MD.,have documented all relevant documentation on the behalf of Lavon Paganini, MD,as directed by  Lavon Paganini, MD while in the presence of Lavon Paganini, MD.  I, Lavon Paganini, MD, have reviewed all documentation for this visit. The documentation on 05/11/21 for the exam, diagnosis, procedures, and orders are all accurate and complete.   , Dionne Bucy, MD, MPH [facility]

## 2021-05-11 NOTE — Assessment & Plan Note (Signed)
Previously well controlled Off of Synthroid for more than 1 year Reviewed recent TSH and other thyroid labs that show no abnormalities No need for medications at this time

## 2021-05-11 NOTE — Assessment & Plan Note (Signed)
Reports 3 episodes of acute gout in her right middle finger over the last year Previously followed by rheumatology for possible RA, so referred back to them

## 2021-05-11 NOTE — Assessment & Plan Note (Signed)
Longstanding but worsening Reviewed recent labs with patient Discussed that this is not related to anemia or thyroid issues Her B12 level has decreased and her MCV has increased, so she may benefit from a B12 supplement

## 2021-05-11 NOTE — Patient Instructions (Signed)

## 2021-05-11 NOTE — Assessment & Plan Note (Signed)
New problem Referral to neurology for evaluation

## 2021-05-12 ENCOUNTER — Encounter: Payer: Self-pay | Admitting: Family Medicine

## 2021-05-13 ENCOUNTER — Other Ambulatory Visit: Payer: Self-pay

## 2021-05-13 DIAGNOSIS — F4310 Post-traumatic stress disorder, unspecified: Secondary | ICD-10-CM | POA: Diagnosis not present

## 2021-05-13 DIAGNOSIS — F332 Major depressive disorder, recurrent severe without psychotic features: Secondary | ICD-10-CM | POA: Diagnosis not present

## 2021-05-13 DIAGNOSIS — F900 Attention-deficit hyperactivity disorder, predominantly inattentive type: Secondary | ICD-10-CM | POA: Diagnosis not present

## 2021-05-13 MED ORDER — CEPHALEXIN 500 MG PO CAPS: 2000.0000 mg | ORAL_CAPSULE | Freq: Once | ORAL | 1 refills | Status: DC

## 2021-05-13 MED ORDER — PROAIR HFA 108 (90 BASE) MCG/ACT IN AERS: 1.0000 | INHALATION_SPRAY | Freq: Four times a day (QID) | RESPIRATORY_TRACT | 1 refills | Status: DC | PRN

## 2021-05-14 DIAGNOSIS — J3489 Other specified disorders of nose and nasal sinuses: Secondary | ICD-10-CM | POA: Diagnosis not present

## 2021-05-14 DIAGNOSIS — J343 Hypertrophy of nasal turbinates: Secondary | ICD-10-CM | POA: Diagnosis not present

## 2021-05-14 DIAGNOSIS — J310 Chronic rhinitis: Secondary | ICD-10-CM | POA: Diagnosis not present

## 2021-05-14 DIAGNOSIS — J342 Deviated nasal septum: Secondary | ICD-10-CM | POA: Diagnosis not present

## 2021-05-18 ENCOUNTER — Ambulatory Visit: Payer: Self-pay | Admitting: Family Medicine

## 2021-05-19 DIAGNOSIS — F332 Major depressive disorder, recurrent severe without psychotic features: Secondary | ICD-10-CM | POA: Diagnosis not present

## 2021-05-19 DIAGNOSIS — F900 Attention-deficit hyperactivity disorder, predominantly inattentive type: Secondary | ICD-10-CM | POA: Diagnosis not present

## 2021-05-19 DIAGNOSIS — F4310 Post-traumatic stress disorder, unspecified: Secondary | ICD-10-CM | POA: Diagnosis not present

## 2021-05-20 DIAGNOSIS — E039 Hypothyroidism, unspecified: Secondary | ICD-10-CM | POA: Diagnosis not present

## 2021-05-20 DIAGNOSIS — N951 Menopausal and female climacteric states: Secondary | ICD-10-CM | POA: Diagnosis not present

## 2021-05-22 ENCOUNTER — Telehealth: Payer: Medicaid Other | Admitting: Nurse Practitioner

## 2021-05-22 DIAGNOSIS — J019 Acute sinusitis, unspecified: Secondary | ICD-10-CM | POA: Diagnosis not present

## 2021-05-22 DIAGNOSIS — B9689 Other specified bacterial agents as the cause of diseases classified elsewhere: Secondary | ICD-10-CM

## 2021-05-23 MED ORDER — FLUTICASONE PROPIONATE 50 MCG/ACT NA SUSP: 2.0000 | Freq: Every day | NASAL | 0 refills | Status: DC

## 2021-05-23 MED ORDER — FLUCONAZOLE 150 MG PO TABS: ORAL_TABLET | ORAL | 0 refills | Status: DC

## 2021-05-23 MED ORDER — DOXYCYCLINE HYCLATE 100 MG PO TABS: 100.0000 mg | ORAL_TABLET | Freq: Two times a day (BID) | ORAL | 0 refills | Status: AC

## 2021-05-23 NOTE — Progress Notes (Signed)
We are sorry that you are not feeling well.  Here is how we plan to help!  Based on what you have shared with me it looks like you have sinusitis.  Sinusitis is inflammation and infection in the sinus cavities of the head.  Based on your presentation I believe you most likely have Acute Bacterial Sinusitis.  This is an infection caused by bacteria and is treated with antibiotics. I have prescribed Doxycycline 100mg  by mouth twice a day for 10 days. I am also prescribing Flonase nasal spray, use 2 sprays in each nostril until symptoms improve.  You may use an oral decongestant such as Mucinex D or if you have glaucoma or high blood pressure use plain Mucinex. Saline nasal spray help and can safely be used as often as needed for congestion.  If you develop worsening sinus pain, fever or notice severe headache and vision changes, or if symptoms are not better after completion of antibiotic, please schedule an appointment with a health care provider.    Sinus infections are not as easily transmitted as other respiratory infection, however we still recommend that you avoid close contact with loved ones, especially the very young and elderly.  Remember to wash your hands thoroughly throughout the day as this is the number one way to prevent the spread of infection!  Home Care: Only take medications as instructed by your medical team. Complete the entire course of an antibiotic. Do not take these medications with alcohol. A steam or ultrasonic humidifier can help congestion.  You can place a towel over your head and breathe in the steam from hot water coming from a faucet. Avoid close contacts especially the very young and the elderly. Cover your mouth when you cough or sneeze. Always remember to wash your hands.  Get Help Right Away If: You develop worsening fever or sinus pain. You develop a severe head ache or visual changes. Your symptoms persist after you have completed your treatment plan.  Make  sure you Understand these instructions. Will watch your condition. Will get help right away if you are not doing well or get worse.  Your e-visit answers were reviewed by a board certified advanced clinical practitioner to complete your personal care plan.  Depending on the condition, your plan could have included both over the counter or prescription medications.  If there is a problem please reply  once you have received a response from your provider.  Your safety is important to [place].  If you have drug allergies check your prescription carefully.    You can use MyChart to ask questions about today's visit, request a non-urgent call back, or ask for a work or school excuse for 24 hours related to this e-Visit. If it has been greater than 24 hours you will need to follow up with your provider, or enter a new e-Visit to address those concerns.  You will get an e-mail in the next two days asking about your experience.  I hope that your e-visit has been valuable and will speed your recovery. Thank you for using e-visits.   I have spent at least 5 minutes reviewing and documenting in the patient's chart.

## 2021-05-23 NOTE — Addendum Note (Signed)
Addended by: Cari Caraway on: 05/23/2021 07:31 AM   Modules accepted: Orders

## 2021-05-24 DIAGNOSIS — E039 Hypothyroidism, unspecified: Secondary | ICD-10-CM | POA: Diagnosis not present

## 2021-05-24 DIAGNOSIS — N898 Other specified noninflammatory disorders of vagina: Secondary | ICD-10-CM | POA: Diagnosis not present

## 2021-05-24 DIAGNOSIS — N951 Menopausal and female climacteric states: Secondary | ICD-10-CM | POA: Diagnosis not present

## 2021-05-24 DIAGNOSIS — Z6822 Body mass index (BMI) 22.0-22.9, adult: Secondary | ICD-10-CM | POA: Diagnosis not present

## 2021-05-24 DIAGNOSIS — R6882 Decreased libido: Secondary | ICD-10-CM | POA: Diagnosis not present

## 2021-05-24 DIAGNOSIS — R232 Flushing: Secondary | ICD-10-CM | POA: Diagnosis not present

## 2021-05-27 DIAGNOSIS — H1045 Other chronic allergic conjunctivitis: Secondary | ICD-10-CM | POA: Diagnosis not present

## 2021-05-27 DIAGNOSIS — J4530 Mild persistent asthma, uncomplicated: Secondary | ICD-10-CM | POA: Diagnosis not present

## 2021-05-27 DIAGNOSIS — J300 Vasomotor rhinitis: Secondary | ICD-10-CM | POA: Diagnosis not present

## 2021-05-27 DIAGNOSIS — R21 Rash and other nonspecific skin eruption: Secondary | ICD-10-CM | POA: Diagnosis not present

## 2021-05-28 ENCOUNTER — Encounter: Payer: Self-pay | Admitting: Family Medicine

## 2021-05-29 ENCOUNTER — Other Ambulatory Visit: Payer: Self-pay | Admitting: Family Medicine

## 2021-05-29 DIAGNOSIS — F411 Generalized anxiety disorder: Secondary | ICD-10-CM

## 2021-05-29 DIAGNOSIS — F331 Major depressive disorder, recurrent, moderate: Secondary | ICD-10-CM

## 2021-05-29 NOTE — Telephone Encounter (Signed)
Last RF 04/27/21 #[id number]

## 2021-05-31 ENCOUNTER — Telehealth: Payer: Self-pay | Admitting: Family Medicine

## 2021-05-31 DIAGNOSIS — E039 Hypothyroidism, unspecified: Secondary | ICD-10-CM

## 2021-05-31 NOTE — Telephone Encounter (Signed)
Pt is calling to check on the status of the referral to Dr. Lajean Silvius, [facility]. She specializes in adrenal gland disfunction. The referral can be sent via fax to [phone].

## 2021-05-31 NOTE — Telephone Encounter (Signed)
Ok to place referral requested for Endo

## 2021-06-02 ENCOUNTER — Ambulatory Visit: Payer: Medicaid Other | Admitting: Dermatology

## 2021-06-02 ENCOUNTER — Ambulatory Visit: Payer: Self-pay | Admitting: *Deleted

## 2021-06-02 DIAGNOSIS — F4310 Post-traumatic stress disorder, unspecified: Secondary | ICD-10-CM | POA: Diagnosis not present

## 2021-06-02 DIAGNOSIS — F332 Major depressive disorder, recurrent severe without psychotic features: Secondary | ICD-10-CM | POA: Diagnosis not present

## 2021-06-02 DIAGNOSIS — F900 Attention-deficit hyperactivity disorder, predominantly inattentive type: Secondary | ICD-10-CM | POA: Diagnosis not present

## 2021-06-02 NOTE — Telephone Encounter (Signed)
FYI

## 2021-06-02 NOTE — Telephone Encounter (Signed)
C/o right leg and top of foot at ankle area vein bruised and painful. C/o exercising last Thursday and using gym equipment and noted top of foot at ankle area noted rubbing equipment. After exercising noted vein on top of foot looked like 'varicose vein" and she applied pressure . Area to right foot still appears bruised and now painful to touch. Pain reported from foot to shin area. Denies chest pain , difficulty breathing, fever. Reports good capillary refill to right toes. Foot not cool to touch. Instructed patient to go to ED and patient reports she would rather go to UC since no available appt with PCP. Care advise given. Patient verbalized understanding of care advise and to go to [facility] or ED or call 911 if symptoms worsen. Patient requesting if PCP wants her to go to ED she will.

## 2021-06-02 NOTE — Telephone Encounter (Signed)
Reason for Disposition  Localized pain, redness or hard lump along vein  Answer Assessment - Initial Assessment Questions 1. ONSET: "When did the pain start?"      Last Thursday  2. LOCATION: "Where is the pain located?"      Above right ankle area on foot and leg  3. PAIN: "How bad is the pain?"    (Scale 1-10; or mild, moderate, severe)   -  MILD (1-3): doesn't interfere with normal activities    -  MODERATE (4-7): interferes with normal activities (e.g., work or school) or awakens from sleep, limping    -  SEVERE (8-10): excruciating pain, unable to do any normal activities, unable to walk     Getting worse  4. WORK OR EXERCISE: "Has there been any recent work or exercise that involved this part of the body?"      Yes  right leg and foot at bend of foot and leg  5. CAUSE: "What do you think is causing the leg pain?"     Exercise machine, foot slipped and started hurting from over use in compromised postition 6. OTHER SYMPTOMS: "Do you have any other symptoms?" (e.g., chest pain, back pain, breathing difficulty, swelling, rash, fever, numbness, weakness)     Vein in bend of foot and leg looks bruised and painful 7. PREGNANCY: "Is there any chance you are pregnant?" "When was your last menstrual period?"     na  Protocols used: Leg Pain-A-AH

## 2021-06-03 ENCOUNTER — Other Ambulatory Visit: Payer: Self-pay

## 2021-06-03 ENCOUNTER — Ambulatory Visit (INDEPENDENT_AMBULATORY_CARE_PROVIDER_SITE_OTHER): Payer: Medicaid Other

## 2021-06-03 ENCOUNTER — Ambulatory Visit
Admission: RE | Admit: 2021-06-03 | Discharge: 2021-06-03 | Disposition: A | Discharge status: Home / Self Care | Payer: Medicaid Other | Source: Ambulatory Visit | Attending: Emergency Medicine | Admitting: Emergency Medicine

## 2021-06-03 VITALS — BP 112/75 | HR 101 | Temp 98.8°F | Resp 18

## 2021-06-03 DIAGNOSIS — S8991XA Unspecified injury of right lower leg, initial encounter: Secondary | ICD-10-CM | POA: Diagnosis not present

## 2021-06-03 DIAGNOSIS — S9001XA Contusion of right ankle, initial encounter: Secondary | ICD-10-CM

## 2021-06-03 DIAGNOSIS — M25571 Pain in right ankle and joints of right foot: Secondary | ICD-10-CM

## 2021-06-03 DIAGNOSIS — S99911A Unspecified injury of right ankle, initial encounter: Secondary | ICD-10-CM | POA: Diagnosis not present

## 2021-06-03 DIAGNOSIS — M79604 Pain in right leg: Secondary | ICD-10-CM | POA: Diagnosis not present

## 2021-06-03 NOTE — ED Provider Notes (Signed)
Roderic Palau    CSN: [id number] Arrival date & time: 06/03/21  1103      History   Chief Complaint Chief Complaint  Patient presents with   Leg Injury    RLE   Appt 11:00    HPI [name] is a 45 y.o. female.  Patient presents with right lower leg/ankle pain and bruising x7 days.  She injured it on a piece of equipment at the gym 7 days ago.  She states at the time the vein appeared engorged at the site; She applied pressure and pressed the vein back down.  She is concerned for a possible blood clot; she states she has been told in the past that her blood is hypercoagulable.  She denies numbness, weakness, paresthesias, open wounds, shortness of breath, or other symptoms.  Her medical history includes polyarthralgia, rheumatoid arthritis, migraine headaches, insomnia, PTSD, ADHD, anxiety, depression, asthma, cervical spondylosis, cervical disc disorder, chronic constipation, chronic rhinitis, chronic fatigue, chronic gout.  The history is provided by the patient and medical records.   Past Medical History:  Diagnosis Date   ADD (attention deficit disorder)    ADHD    Bone spur    "front of spine" - effects right shoulder (sometimes)   Central serous chorioretinopathy, right eye    Depression    Deviated nasal septum    Dysplastic nevus 08/21/2019   Right lower abdomen. Moderate atypia, margins free.   History of sinus surgery 09/28/2020   Hypothyroidism    Nondisplaced fracture of fifth left metatarsal bone    PTSD (post-traumatic stress disorder)    RA (rheumatoid arthritis) ([place]) 2016   positive ANA   Rheumatoid arthritis ([place]) 12/09/2016   Sinusitis     Patient Active Problem List   Diagnosis Date Noted   Chronic fatigue 05/11/2021   Chronic gout of right hand 05/11/2021   Brain fog 05/11/2021   Contusion of left elbow 11/30/2020   Fall 11/30/2020   Left elbow pain 11/30/2020   Bacterial conjunctivitis 10/26/2020   Cervical disc disorder with  myelopathy of mid-cervical region 07/09/2020   Cervical radiculitis 05/13/2020   Sprain of interphalangeal joint of finger 02/19/2020   Chronic constipation 12/31/2019   Deviated nasal septum 09/24/2019   Obstruction of nasal valve 09/24/2019   Chronic rhinitis 09/24/2019   GAD (generalized anxiety disorder) 03/15/2019   Moderate episode of recurrent major depressive disorder ([place]) 03/15/2019   Rheumatoid arthritis ([place]) 11/01/2018   Primary osteoarthritis of both hands 05/11/2018   Primary osteoarthritis of both feet 05/11/2018   Raynaud's disease without gangrene 05/11/2018   Asthma 04/20/2018   Family history of rheumatoid arthritis 04/10/2018   Dry mouth 01/23/2018   PTSD (post-traumatic stress disorder) 10/16/2017   Insomnia 10/16/2017   Migraines 10/16/2017   ADHD (attention deficit hyperactivity disorder)    Uterine leiomyoma 09/22/2017   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   Hypothyroidism 03/30/2017   Polyarthralgia 12/09/2016   Cervical spondylosis with myelopathy 06/12/2015    Past Surgical History:  Procedure Laterality Date   ARTHROPLASTY  10/02/2020   C5-6 C6-7    BACK SURGERY     CESAREAN SECTION     DILATION AND CURETTAGE OF UTERUS     (x2)   FRONTAL SINUS EXPLORATION Bilateral 11/19/2015   Procedure: FRONTAL SINUS EXPLORATION;  Surgeon: Margaretha Sheffield, MD;  Location: [place];  Service: ENT;  Laterality: Bilateral;   IMAGE GUIDED SINUS SURGERY N/A 11/19/2015   Procedure: IMAGE GUIDED SINUS SURGERY;  Surgeon: Margaretha Sheffield, MD;  Location: [place];  Service: ENT;  Laterality: N/A;  GAVE DISK TO CE CE 09/22   MAXILLARY ANTROSTOMY Bilateral 11/19/2015   Procedure: MAXILLARY ANTROSTOMY;  Surgeon: Margaretha Sheffield, MD;  Location: [place];  Service: ENT;  Laterality: Bilateral;   SEPTOPLASTY N/A 11/19/2015   Procedure: SEPTOPLASTY;  Surgeon: Margaretha Sheffield, MD;  Location: [place];  Service: ENT;  Laterality: N/A;   SPHENOIDECTOMY  Bilateral 11/19/2015   Procedure: Coralee Pesa;  Surgeon: Margaretha Sheffield, MD;  Location: [place];  Service: ENT;  Laterality: Bilateral;   TONSILLECTOMY      OB History     Gravida  3   Para  2   Term  1   Preterm  1   AB  1   Living  1      SAB  1   IAB      Ectopic      Multiple      Live Births  1            Home Medications    Prior to Admission medications   Medication Sig Start Date End Date Taking? Authorizing Provider  albuterol (ACCUNEB) 0.63 MG/3ML nebulizer solution Take 1 ampule by nebulization every 6 (six) hours as needed for wheezing.    [provider]  AUROVELA FE 1.5/30 1.5-30 MG-MCG tablet Take 1 tablet by mouth daily. 04/19/21   [provider]  buPROPion (WELLBUTRIN) 75 MG tablet TAKE 2 TABLETS BY MOUTH EVERY MORNING AND 1 TABLET EVERY DAY AFTER LUNCH 04/27/21   Bacigalupo, Dionne Bucy, MD  eplerenone (INSPRA) 25 MG tablet Take 1 tablet by mouth 2 (two) times daily. 03/12/21 03/12/22  [provider]  estradiol (ESTRACE) 1 MG tablet Take 1 mg by mouth daily.    [provider]  fexofenadine (ALLEGRA) 180 MG tablet Take 180 mg by mouth daily.    [provider]  fluconazole (DIFLUCAN) 150 MG tablet Take one tablet on the first day of the antibiotic and take the second tablet when you complete the antibiotic. 05/23/21   Kara Dies, NP  fluticasone (FLONASE) 50 MCG/ACT nasal spray Place 2 sprays into both nostrils daily for 14 days. 05/23/21 06/06/21  Kara Dies, NP  LINZESS 145 MCG CAPS capsule Take 145 mcg by mouth daily. 02/03/20   [provider]  lisdexamfetamine (VYVANSE) 50 MG capsule Take 1 capsule (50 mg total) by mouth daily. 05/06/21   Virginia Crews, MD  lisdexamfetamine (VYVANSE) 50 MG capsule Take 1 capsule (50 mg total) by mouth daily. 05/06/21   Virginia Crews, MD  lisdexamfetamine (VYVANSE) 50 MG capsule Take 1 capsule (50 mg total) by mouth  daily. 05/06/21   Virginia Crews, MD  methocarbamol (ROBAXIN) 500 MG tablet Take by mouth. 06/22/20   [provider]  mometasone (ELOCON) 0.1 % cream Apply 1 application topically daily. 01/04/21   Mar Daring, PA-C  montelukast (SINGULAIR) 10 MG tablet Take 10 mg by mouth at bedtime.    [provider]  naratriptan (AMERGE) 2.5 MG tablet Take 1 tablet (2.5 mg total) by mouth as needed for migraine. Take one (1) tablet at onset of headache; if returns or does not resolve, may repeat after 4 hours; do not exceed five (5) mg in 24 hours. 02/14/20   Virginia Crews, MD  PROAIR HFA 108 [phone] Base) MCG/ACT inhaler Inhale 1-2 puffs into the lungs every 6 (six) hours as  needed for wheezing or shortness of breath. 05/13/21   Virginia Crews, MD  SUMAtriptan (IMITREX) 100 MG tablet TK 1 T PO AOS OF HEADACHE. REPEAT IN 2 H AS NEEDED. MAX DOSE 2 T IN 24 H 01/25/21   Virginia Crews, MD  traZODone (DESYREL) 150 MG tablet Take 0.5-1 tablets (75-150 mg total) by mouth at bedtime as needed for sleep. 04/19/21   Bacigalupo, Dionne Bucy, MD    Family History Family History  Problem Relation Age of Onset   Rheum arthritis Mother    Cancer Paternal Grandfather    Cancer Cousin    Vascular Disease Father    Alcohol abuse Brother        history of    Drug abuse Brother        history of    Breast cancer Neg Hx     Social History Social History   Tobacco Use   Smoking status: Never   Smokeless tobacco: Never  Vaping Use   Vaping Use: Never used  Substance Use Topics   Alcohol use: Yes    Alcohol/week: 0.0 standard drinks    Comment: rare- wine    Drug use: Never     Allergies   Penicillins and Sulfa antibiotics   Review of Systems Review of Systems  Constitutional:  Negative for chills and fever.  Respiratory:  Negative for cough and shortness of breath.   Cardiovascular:  Negative for chest pain and palpitations.  Gastrointestinal:  Negative for abdominal  pain and vomiting.  Musculoskeletal:  Positive for arthralgias. Negative for gait problem.  Skin:  Positive for color change. Negative for rash and wound.  Neurological:  Negative for weakness and numbness.  All other systems reviewed and are negative.   Physical Exam Triage Vital Signs ED Triage Vitals [06/03/21 1143]  Enc Vitals Group     BP 112/75     Pulse Rate (!) 101     Resp 18     Temp 98.8 F (37.1 C)     Temp Source Oral     SpO2 99 %     Weight      Height      Head Circumference      Peak Flow      Pain Score      Pain Loc      Pain Edu?      Excl. in [place]?    No data found.  Updated Vital Signs BP 112/75 (BP Location: Left Arm)   Pulse (!) 101   Temp 98.8 F (37.1 C) (Oral)   Resp 18   LMP  (Exact Date)   SpO2 99%   Visual Acuity Right Eye Distance:   Left Eye Distance:   Bilateral Distance:    Right Eye Near:   Left Eye Near:    Bilateral Near:     Physical Exam Vitals and nursing note reviewed.  Constitutional:      General: She is not in acute distress.    Appearance: She is well-developed. She is not ill-appearing.  HENT:     Head: Normocephalic and atraumatic.     Mouth/Throat:     Mouth: Mucous membranes are moist.  Eyes:     Conjunctiva/sclera: Conjunctivae normal.  Cardiovascular:     Rate and Rhythm: Normal rate and regular rhythm.     Heart sounds: Normal heart sounds. No murmur heard. Pulmonary:     Effort: Pulmonary effort is normal. No respiratory distress.  Breath sounds: Normal breath sounds.  Abdominal:     Palpations: Abdomen is soft.     Tenderness: There is no abdominal tenderness.  Musculoskeletal:        General: Tenderness present. No swelling, deformity or signs of injury. Normal range of motion.     Cervical back: Neck supple.       Legs:  Skin:    General: Skin is warm and dry.     Findings: Bruising present. No erythema, lesion or rash.  Neurological:     General: No focal deficit present.     Mental  Status: She is alert and oriented to person, place, and time.     Sensory: No sensory deficit.     Motor: No weakness.     Gait: Gait normal.  Psychiatric:        Mood and Affect: Mood normal.        Behavior: Behavior normal.     UC Treatments / Results  Labs (all labs ordered are listed, but only abnormal results are displayed) Labs Reviewed - No data to display  EKG   Radiology DG Ankle Complete Right  Result Date: 06/03/2021 CLINICAL DATA:  RIGHT ankle and lower leg pain/soreness for 1 week after injury 7 days ago working out at the gym EXAM: RIGHT ANKLE - COMPLETE 3+ VIEW COMPARISON:  None FINDINGS: Osseous mineralization normal. Joint spaces preserved. No acute fracture, dislocation, or bone destruction. Small plantar calcaneal spur. IMPRESSION: No acute osseous abnormalities. Electronically Signed   By: Lavonia Dana M.D.   On: 06/03/2021 12:51   US Venous Img Lower Unilateral Right (DVT)  Result Date: 06/03/2021 CLINICAL DATA:  Pain in RIGHT LOWER extremity after injury 1 week ago. EXAM: RIGHT LOWER EXTREMITY VENOUS DOPPLER ULTRASOUND TECHNIQUE: Gray-scale sonography with compression, as well as color and duplex ultrasound, were performed to evaluate the deep venous system(s) from the level of the common femoral vein through the popliteal and proximal calf veins. COMPARISON:  None. FINDINGS: VENOUS Normal compressibility of the common femoral, superficial femoral, and popliteal veins, as well as the visualized calf veins. Visualized portions of profunda femoral vein and great saphenous vein unremarkable. No filling defects to suggest DVT on grayscale or color Doppler imaging. Doppler waveforms show normal direction of venous flow, normal respiratory plasticity and response to augmentation. Limited views of the contralateral common femoral vein are unremarkable. OTHER None. Limitations: none IMPRESSION: Negative. Electronically Signed   By: Nolon Nations M.D.   On: 06/03/2021 14:18     Procedures Procedures (including critical care time)  Medications Ordered in UC Medications - No data to display  Initial Impression / Assessment and Plan / UC Course  I have reviewed the triage vital signs and the nursing notes.  Pertinent labs & imaging results that were available during my care of the patient were reviewed by me and considered in my medical decision making (see chart for details).   Right ankle contusion.  Xray negative.  Ultrasound negative for DVT.  Discussed ultrasound results with patient via telephone.  Instructed patient to follow-up with her PCP or an orthopedist if her symptoms are not improving.  She agrees to plan of care.   Final Clinical Impressions(s) / UC Diagnoses   Final diagnoses:  Contusion of right ankle, initial encounter     Discharge Instructions      The x-ray of your ankle is normal.  Go to [facility] radiology department for your DVT ultrasound.  Your appointment is  at 1:30 PM today.     ED Prescriptions   None    PDMP not reviewed this encounter.   Sharion Balloon, NP 06/03/21 1430

## 2021-06-03 NOTE — ED Notes (Signed)
Stat Ultrasound scheduled ([phone]) today at 1:30 pm at [facility]. Patient is to arrive at [facility] @1 :15 pm.

## 2021-06-03 NOTE — Discharge Instructions (Addendum)
The x-ray of your ankle is normal.  Go to [facility] radiology department for your DVT ultrasound.  Your appointment is at 1:30 PM today.

## 2021-06-03 NOTE — Telephone Encounter (Signed)
Noted  

## 2021-06-03 NOTE — ED Triage Notes (Signed)
Pt presents today with c/o of right lower leg pain/soreness x 1 week. She reports that she injured it while working out at gym last week. PCP told her to come to Urgent Care to get checked out. No swelling or deformity.

## 2021-06-07 ENCOUNTER — Telehealth: Payer: Self-pay

## 2021-06-07 DIAGNOSIS — M2550 Pain in unspecified joint: Secondary | ICD-10-CM | POA: Diagnosis not present

## 2021-06-07 DIAGNOSIS — R768 Other specified abnormal immunological findings in serum: Secondary | ICD-10-CM | POA: Diagnosis not present

## 2021-06-07 DIAGNOSIS — Z8739 Personal history of other diseases of the musculoskeletal system and connective tissue: Secondary | ICD-10-CM | POA: Diagnosis not present

## 2021-06-07 DIAGNOSIS — M359 Systemic involvement of connective tissue, unspecified: Secondary | ICD-10-CM | POA: Diagnosis not present

## 2021-06-07 DIAGNOSIS — I7300 Raynaud's syndrome without gangrene: Secondary | ICD-10-CM | POA: Diagnosis not present

## 2021-06-07 DIAGNOSIS — Z79899 Other long term (current) drug therapy: Secondary | ICD-10-CM | POA: Diagnosis not present

## 2021-06-07 NOTE — Telephone Encounter (Signed)
Will need to see if we have these labs (sex hormone binding globulin)

## 2021-06-07 NOTE — Telephone Encounter (Signed)
Copied from [place] [id number]. Topic: General - Other >> Jun 04, 2021  3:01 PM Leward Quan A wrote: Reason for CRM: Patient called in to in to inform Dr B or Vernie Murders that [facility] need to have these listed on the referral sent in for Endocrinology ( [facility] ) and [facility]. Say that it is requested by [facility]

## 2021-06-10 NOTE — Telephone Encounter (Signed)
Do I need to order these labs? Please advise. Thanks!

## 2021-06-11 ENCOUNTER — Other Ambulatory Visit: Payer: Self-pay | Admitting: Family Medicine

## 2021-06-11 DIAGNOSIS — H35711 Central serous chorioretinopathy, right eye: Secondary | ICD-10-CM | POA: Diagnosis not present

## 2021-06-11 NOTE — Telephone Encounter (Signed)
I guess.. or see if she has them from another office

## 2021-06-11 NOTE — Telephone Encounter (Signed)
Requested medication (s) are due for refill today: no  Requested medication (s) are on the active medication list: yes   Last refill: 06/09/2021  Future visit scheduled: yes   Notes to clinic:  Medication not assigned to a protocol, review manually   Requested Prescriptions  Pending Prescriptions Disp Refills   cephALEXin (KEFLEX) 500 MG capsule [Pharmacy Med Name: CEPHALEXIN 500MG  CAPSULES] 4 capsule 1    Sig: TAKE 4 CAPSULES BY MOUTH AS A SINGLE DOSE      Off-Protocol Failed - 06/11/2021  2:31 PM      Failed - Medication not assigned to a protocol, review manually.      Passed - Valid encounter within last 12 months    Recent Outpatient Visits           1 month ago Chronic fatigue   [facility] [place], Dionne Bucy, MD   1 month ago Hypothyroidism, unspecified type   [place], Vickki Muff, PA-C   5 months ago Other [place], [place]   6 months ago Left elbow pain   [facility], Kelby Aline, FNP   6 months ago Encounter for annual physical exam   [facility], Dionne Bucy, MD       Future Appointments             In 1 month Bacigalupo, Dionne Bucy, MD [facility], [place]   In 1 month [facility], [place], [place]

## 2021-06-15 ENCOUNTER — Encounter: Payer: Self-pay | Admitting: Podiatry

## 2021-06-15 ENCOUNTER — Ambulatory Visit: Payer: Medicaid Other | Admitting: Podiatry

## 2021-06-15 ENCOUNTER — Other Ambulatory Visit: Payer: Self-pay

## 2021-06-15 ENCOUNTER — Ambulatory Visit (INDEPENDENT_AMBULATORY_CARE_PROVIDER_SITE_OTHER): Payer: Medicaid Other

## 2021-06-15 ENCOUNTER — Other Ambulatory Visit: Payer: Self-pay | Admitting: Podiatry

## 2021-06-15 DIAGNOSIS — H9012 Conductive hearing loss, unilateral, left ear, with unrestricted hearing on the contralateral side: Secondary | ICD-10-CM | POA: Insufficient documentation

## 2021-06-15 DIAGNOSIS — M898X7 Other specified disorders of bone, ankle and foot: Secondary | ICD-10-CM

## 2021-06-15 DIAGNOSIS — M2041 Other hammer toe(s) (acquired), right foot: Secondary | ICD-10-CM

## 2021-06-15 DIAGNOSIS — H9313 Tinnitus, bilateral: Secondary | ICD-10-CM | POA: Insufficient documentation

## 2021-06-15 DIAGNOSIS — H35719 Central serous chorioretinopathy, unspecified eye: Secondary | ICD-10-CM | POA: Insufficient documentation

## 2021-06-15 MED ORDER — TERBINAFINE HCL 250 MG PO TABS: 250.0000 mg | ORAL_TABLET | Freq: Every day | ORAL | 0 refills | Status: DC

## 2021-06-15 NOTE — Telephone Encounter (Signed)
Left message for patient to call back to clarify if we need to order labs or if they need to be included in the referral for endo to do at appointment.

## 2021-06-15 NOTE — Progress Notes (Addendum)
Subjective: 45 y.o. female presenting today for 2 separate complaints.  First, the patient states that she is getting some discoloration and thickening of her toenails bilateral.  She is concerned for onychomycosis and toenail fungus.  She has taken oral Lamisil in the past which is helped significantly.  She presents to have it evaluated  Patient states that also over the past several years she has had pain and sensitivity to the fourth and fifth toes of the right foot.  She develops a interdigital corn which is very symptomatic.  This occurs monthly despite trimming the corn and wearing wide fitting shoes.  The patient does have a history of surgery to the fourth toe of the right foot which developed a bone spur and it rubs against the fifth toe so she says.  She presents for further treatment and evaluation  Past Medical History:  Diagnosis Date   ADD (attention deficit disorder)    ADHD    Bone spur    "front of spine" - effects right shoulder (sometimes)   Central serous chorioretinopathy, right eye    Depression    Deviated nasal septum    Dysplastic nevus 08/21/2019   Right lower abdomen. Moderate atypia, margins free.   History of sinus surgery 09/28/2020   Hypothyroidism    Nondisplaced fracture of fifth left metatarsal bone    PTSD (post-traumatic stress disorder)    RA (rheumatoid arthritis) ([place]) 2016   positive ANA   Rheumatoid arthritis ([place]) 12/09/2016   Sinusitis     Objective: Physical Exam General: The patient is alert and oriented x3 in no acute distress.  Dermatology: Hyperkeratotic, discolored, thickened, onychodystrophy noted bilateral toes. Skin is warm, dry and supple bilateral lower extremities. Negative for open lesions or macerations.  Vascular: Palpable pedal pulses bilaterally. No edema or erythema noted. Capillary refill within normal limits.  Neurological: Epicritic and protective threshold grossly intact bilaterally.   Musculoskeletal Exam:  Range of motion within normal limits to all pedal and ankle joints bilateral. Muscle strength 5/5 in all groups bilateral.  Pain on palpation between the fourth and fifth digits of the right foot with a palpable exostosis noted especially to the fourth digit of the right foot  Radiographic exam RT foot: Joint spaces are preserved.  Prior surgery noted to the fourth digit of the right foot.  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 #2  Today we discussed different treatment options including oral, topical, and laser antifungal treatment modalities.  The patient opts for oral antifungal treatment modalities.  She is otherwise healthy and denies a history of liver pathology or symptoms #3 prescription for Lamisil turned 50 mg #90 daily #4 Today we discussed the conservative versus surgical management of the presenting pathology. The patient opts for surgical management. All possible complications and details of the procedure were explained. All patient questions were answered. No guarantees were expressed or implied. #5 authorization for surgery was initiated today. Surgery will consist of exostectomy fourth and fifth digits of the right foot #6 return to clinic 1 week postop  *Currently getting a second online masters degree in mental health counseling.  Used to be share for the sheriff's office    Edrick Kins, DPM [facility]  Dr. Edrick Kins, DPM    [address], [place] [address]                Office [phone]  Fax [phone]

## 2021-06-16 ENCOUNTER — Telehealth: Payer: Self-pay

## 2021-06-16 NOTE — Telephone Encounter (Signed)
Received surgery paperwork from the [place] office. Spoke to [name] but she was walking out the door for an appointment and asked to call me back to schedule. Gave her my number to call me at her convenience.

## 2021-06-17 ENCOUNTER — Ambulatory Visit: Payer: Medicaid Other | Admitting: Family Medicine

## 2021-06-17 NOTE — Telephone Encounter (Signed)
LMTCB

## 2021-06-18 ENCOUNTER — Telehealth: Payer: Self-pay

## 2021-06-18 NOTE — Telephone Encounter (Signed)
Copied from [place] [id number]. Topic: General - Other >> Jun 18, 2021 11:58 AM Tessa Lerner A wrote: Reason for CRM: Patient has returned missed call from practice  Please contact to further advise when possible

## 2021-06-21 NOTE — Telephone Encounter (Signed)
Sent mychart message for patient to message [place] with clarification of labs for referral.

## 2021-06-25 DIAGNOSIS — R6889 Other general symptoms and signs: Secondary | ICD-10-CM | POA: Diagnosis not present

## 2021-06-25 DIAGNOSIS — R7989 Other specified abnormal findings of blood chemistry: Secondary | ICD-10-CM | POA: Diagnosis not present

## 2021-06-25 DIAGNOSIS — Z3041 Encounter for surveillance of contraceptive pills: Secondary | ICD-10-CM | POA: Diagnosis not present

## 2021-06-25 NOTE — Telephone Encounter (Signed)
Patient needing [place] to update referral for her to be seen sooner. Patient asked that we include need for referral hypothyroidism along with adrenal gland dis function. Per Helene Kelp referral coordinator fax updated referral with notes to Dr. Rush Landmark office at [facility]. Referral and notes faxed to number in chart for Dr. Donneta Romberg.

## 2021-07-04 ENCOUNTER — Telehealth: Payer: Medicaid Other | Admitting: Family

## 2021-07-04 DIAGNOSIS — J019 Acute sinusitis, unspecified: Secondary | ICD-10-CM

## 2021-07-04 MED ORDER — LEVOFLOXACIN 500 MG PO TABS: 500.0000 mg | ORAL_TABLET | Freq: Every day | ORAL | 0 refills | Status: DC

## 2021-07-04 MED ORDER — FLUCONAZOLE 150 MG PO TABS: 150.0000 mg | ORAL_TABLET | ORAL | 0 refills | Status: DC | PRN

## 2021-07-04 MED ORDER — CLINDAMYCIN HCL 300 MG PO CAPS: 300.0000 mg | ORAL_CAPSULE | Freq: Two times a day (BID) | ORAL | 0 refills | Status: DC

## 2021-07-04 NOTE — Progress Notes (Signed)

## 2021-07-04 NOTE — Addendum Note (Signed)
Addended by: Evelina Dun A on: 07/04/2021 02:24 PM   Modules accepted: Orders

## 2021-07-08 DIAGNOSIS — G959 Disease of spinal cord, unspecified: Secondary | ICD-10-CM | POA: Diagnosis not present

## 2021-07-08 DIAGNOSIS — Z8669 Personal history of other diseases of the nervous system and sense organs: Secondary | ICD-10-CM | POA: Diagnosis not present

## 2021-07-08 DIAGNOSIS — M4322 Fusion of spine, cervical region: Secondary | ICD-10-CM | POA: Diagnosis not present

## 2021-07-12 ENCOUNTER — Telehealth: Payer: Self-pay | Admitting: Family Medicine

## 2021-07-12 DIAGNOSIS — F331 Major depressive disorder, recurrent, moderate: Secondary | ICD-10-CM

## 2021-07-12 DIAGNOSIS — F411 Generalized anxiety disorder: Secondary | ICD-10-CM

## 2021-07-12 MED ORDER — BUPROPION HCL 75 MG PO TABS: ORAL_TABLET | ORAL | 0 refills | Status: DC

## 2021-07-12 NOTE — Telephone Encounter (Signed)
[place] faxed refill request for the following medications:   buPROPion (WELLBUTRIN) 75 MG tablet   Please advise.

## 2021-07-19 DIAGNOSIS — G959 Disease of spinal cord, unspecified: Secondary | ICD-10-CM | POA: Diagnosis not present

## 2021-07-19 DIAGNOSIS — R251 Tremor, unspecified: Secondary | ICD-10-CM | POA: Diagnosis not present

## 2021-07-19 DIAGNOSIS — M5030 Other cervical disc degeneration, unspecified cervical region: Secondary | ICD-10-CM | POA: Diagnosis not present

## 2021-07-19 DIAGNOSIS — R4189 Other symptoms and signs involving cognitive functions and awareness: Secondary | ICD-10-CM | POA: Diagnosis not present

## 2021-07-20 ENCOUNTER — Other Ambulatory Visit: Payer: Self-pay

## 2021-07-20 ENCOUNTER — Telehealth: Payer: Medicaid Other | Admitting: Family Medicine

## 2021-07-20 ENCOUNTER — Encounter: Payer: Self-pay | Admitting: Family Medicine

## 2021-07-20 ENCOUNTER — Ambulatory Visit: Payer: Medicaid Other | Admitting: Family Medicine

## 2021-07-20 VITALS — BP 117/84 | HR 99 | Temp 98.2°F | Resp 16 | Wt 143.2 lb

## 2021-07-20 DIAGNOSIS — R601 Generalized edema: Secondary | ICD-10-CM | POA: Diagnosis not present

## 2021-07-20 DIAGNOSIS — F411 Generalized anxiety disorder: Secondary | ICD-10-CM | POA: Diagnosis not present

## 2021-07-20 DIAGNOSIS — M069 Rheumatoid arthritis, unspecified: Secondary | ICD-10-CM

## 2021-07-20 DIAGNOSIS — F902 Attention-deficit hyperactivity disorder, combined type: Secondary | ICD-10-CM

## 2021-07-20 DIAGNOSIS — F331 Major depressive disorder, recurrent, moderate: Secondary | ICD-10-CM | POA: Diagnosis not present

## 2021-07-20 MED ORDER — LISDEXAMFETAMINE DIMESYLATE 50 MG PO CAPS: 50.0000 mg | ORAL_CAPSULE | Freq: Every day | ORAL | 0 refills | Status: DC

## 2021-07-20 MED ORDER — FUROSEMIDE 20 MG PO TABS: 20.0000 mg | ORAL_TABLET | Freq: Every day | ORAL | 3 refills | Status: DC | PRN

## 2021-07-20 MED ORDER — TRAZODONE HCL 150 MG PO TABS: 75.0000 mg | ORAL_TABLET | Freq: Every evening | ORAL | 5 refills | Status: DC | PRN

## 2021-07-20 MED ORDER — BUPROPION HCL 75 MG PO TABS: ORAL_TABLET | ORAL | 1 refills | Status: DC

## 2021-07-20 NOTE — Assessment & Plan Note (Signed)
Chronic and stable  Continue current meds

## 2021-07-20 NOTE — Progress Notes (Signed)
Established patient visit   Patient: [name]   DOB: 01-Apr-1976   45 y.o. Female  MRN: [id number] Visit Date: 07/20/2021  Today's healthcare provider: Lavon Paganini, MD   Chief Complaint  Patient presents with   Hypothyroidism   ADHD   water retention    Patient would like to address weight gain, she states that she has concerns that she is retaining fluid in her body since starting on medication Methotrexate prescribed by Rheumatology. Patient admits to taking her fathers prescription for Flomax for the past 5 weeks to help with fluid retention.    She means lasix, not flomax Subjective    HPI HPI     water retention    Additional comments: Patient would like to address weight gain, she states that she has concerns that she is retaining fluid in her body since starting on medication Methotrexate prescribed by Rheumatology. Patient admits to taking her fathers prescription for Flomax for the past 5 weeks to help with fluid retention.       Last edited by Minette Headland, CMA on 07/20/2021  4:05 PM.      Hypothyroid, follow-up  Lab Results  Component Value Date   TSH 2.630 05/04/2021   TSH 2.370 11/17/2020   TSH 2.180 08/21/2020   FREET4 1.35 11/17/2020   FREET4 0.97 11/14/2019   T4TOTAL 8.2 05/04/2021   T4TOTAL 11.4 02/19/2016   Wt Readings from Last 3 Encounters:  07/20/21 143 lb 3.2 oz (65 kg)  05/11/21 142 lb (64.4 kg)  05/04/21 143 lb (64.9 kg)    She was last seen for hypothyroid 3 months ago.  Management since that visit includes none.  Symptoms: Yes change in energy level No constipation  Yes diarrhea Yes heat / cold intolerance  Yes nervousness No palpitations  Yes weight changes    -----------------------------------------------------------------------------------------  Follow up for ADHD  The patient was last seen for this 3 months ago. Changes made at last visit include none.  She reports excellent compliance with  treatment. She feels that condition is Improved. She is not having side effects.   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   Conductive hearing loss of left ear with unrestricted hearing of right ear 06/15/2021   Tinnitus of both ears 06/15/2021   Chronic fatigue 05/11/2021   Chronic gout of right hand 05/11/2021   Left elbow pain 11/30/2020   Cervical disc disorder with myelopathy of mid-cervical region 07/09/2020   Cervical radiculitis 05/13/2020   Sprain of interphalangeal joint of finger 02/19/2020   Chronic constipation 12/31/2019   Deviated nasal septum 09/24/2019   Obstruction of nasal valve 09/24/2019   Chronic rhinitis 09/24/2019   GAD (generalized anxiety disorder) 03/15/2019   Moderate episode of recurrent major depressive disorder ([place]) 03/15/2019   Rheumatoid arthritis ([place]) 11/01/2018   Primary osteoarthritis of both hands 05/11/2018   Primary osteoarthritis of both feet 05/11/2018   Raynaud's disease without gangrene 05/11/2018   Asthma 04/20/2018   Family history of rheumatoid arthritis 04/10/2018   Dry mouth 01/23/2018   PTSD (post-traumatic stress disorder) 10/16/2017   Insomnia 10/16/2017   Migraines 10/16/2017   ADHD (attention deficit hyperactivity disorder)    Uterine leiomyoma 09/22/2017   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 Moderate atypia, margins free.   History of sinus surgery 09/28/2020    Hypothyroidism    Nondisplaced fracture of fifth left metatarsal bone    PTSD (post-traumatic stress disorder)    RA (rheumatoid arthritis) ([place]) 2016   positive ANA   Rheumatoid arthritis ([place]) 12/09/2016   Sinusitis    Allergies  Allergen Reactions   Penicillins Swelling    Pt states caused throat to swell    Sulfa Antibiotics Other (See Comments)    Lips tingled       Medications: Outpatient Medications Prior to Visit  Medication Sig   albuterol (ACCUNEB) 0.63 MG/3ML nebulizer solution Take 1 ampule by nebulization every 6 (six) hours as needed for wheezing.   ELIDEL 1 % cream Apply topically 2 (two) times daily.   eplerenone (INSPRA) 25 MG tablet Take 1 tablet by mouth 2 (two) times daily.   estradiol (ESTRACE) 1 MG tablet Take 1 mg by mouth daily.   fluticasone-salmeterol (ADVAIR) 250-50 MCG/ACT AEPB    folic acid (FOLVITE) 1 MG tablet Take 1 mg by mouth daily.   levocetirizine (XYZAL) 5 MG tablet Take 5 mg by mouth daily.   LINZESS 145 MCG CAPS capsule Take 145 mcg by mouth daily.   methocarbamol (ROBAXIN) 500 MG tablet Take by mouth.   methotrexate (RHEUMATREX) 2.5 MG tablet SMARTSIG:5 Tablet(s) By Mouth Once a Week   mometasone (ELOCON) 0.1 % cream Apply topically.   montelukast (SINGULAIR) 10 MG tablet Take by mouth.   naratriptan (AMERGE) 2.5 MG tablet Take 1 tablet (2.5 mg total) by mouth as needed for migraine. Take one (1) tablet at onset of headache; if returns or does not resolve, may repeat after 4 hours; do not exceed five (5) mg in 24 hours.   norethindrone-ethinyl estradiol-iron (HAILEY FE 1.5/30) 1.5-30 MG-MCG tablet Take 1 tablet by mouth daily.   PROAIR HFA 108 (90 Base) MCG/ACT inhaler Inhale 1-2 puffs into the lungs every 6 (six) hours as needed for wheezing or shortness of breath.   SUMAtriptan (IMITREX) 100 MG tablet TK 1 T PO AOS OF HEADACHE. REPEAT IN 2 H AS NEEDED. MAX DOSE 2 T IN 24 H   terbinafine (LAMISIL) 250 MG tablet Take 1 tablet (250 mg total)  by mouth daily.   [DISCONTINUED] buPROPion (WELLBUTRIN) 75 MG tablet TAKE 2 TABLETS BY MOUTH EVERY MORNING AND 1 TABLET EVERY DAY AFTER LUNCH   [DISCONTINUED] lisdexamfetamine (VYVANSE) 50 MG capsule Take 1 capsule (50 mg total) by mouth daily.   [DISCONTINUED] lisdexamfetamine (VYVANSE) 50 MG capsule Take 1 capsule (50 mg total) by mouth daily.   [DISCONTINUED] lisdexamfetamine (VYVANSE) 50 MG capsule Take 1 capsule (50 mg total) by mouth daily.   [DISCONTINUED] traZODone (DESYREL) 150 MG tablet Take 0.5-1 tablets (75-150 mg total) by mouth at bedtime as needed for sleep.   [DISCONTINUED] AUROVELA FE 1.5/30 1.5-30 MG-MCG tablet Take 1 tablet by mouth daily.   [DISCONTINUED] clindamycin (CLEOCIN) 300 MG capsule Take 1 capsule (300 mg total) by mouth in the morning and at bedtime.   [DISCONTINUED] fluconazole (DIFLUCAN) 150 MG tablet Take 1 tablet (150 mg total) by mouth every three (3) days as needed.   [DISCONTINUED] ipratropium (ATROVENT) 0.06 % nasal spray  Place into both nostrils.   No facility-administered medications prior to visit.    Review of Systems - per HPI     Objective    BP 117/84   Pulse 99   Temp 98.2 F (36.8 C) (Oral)   Resp 16   Wt 143 lb 3.2 oz (65 kg)   SpO2 96%   BMI 22.43 kg/m     Physical Exam Vitals reviewed.  Constitutional:      General: She is not in acute distress.    Appearance: Normal appearance. She is well-developed. She is not diaphoretic.  HENT:     Head: Normocephalic and atraumatic.  Eyes:     General: No scleral icterus.    Conjunctiva/sclera: Conjunctivae normal.  Neck:     Thyroid: No thyromegaly.  Cardiovascular:     Rate and Rhythm: Normal rate and regular rhythm.     Pulses: Normal pulses.     Heart sounds: Normal heart sounds. No murmur heard. Pulmonary:     Effort: Pulmonary effort is normal. No respiratory distress.     Breath sounds: Normal breath sounds. No wheezing, rhonchi or rales.  Musculoskeletal:     Cervical  back: Neck supple.     Right lower leg: No edema.     Left lower leg: No edema.  Lymphadenopathy:     Cervical: No cervical adenopathy.  Skin:    General: Skin is warm and dry.     Findings: No rash.  Neurological:     Mental Status: She is alert and oriented to person, place, and time. Mental status is at baseline.  Psychiatric:        Mood and Affect: Mood normal.        Behavior: Behavior normal.      No results found for any visits on 07/20/21.  Assessment & Plan     Problem List Items Addressed This Visit       Musculoskeletal and Integument   Rheumatoid arthritis ([place]) - Primary    Followed by Rheum On MTX and having side effects as below Will treat edema with prn lasix         Other   ADHD (attention deficit hyperactivity disorder)    Chronic and stable Continue Vyvanse at current dose       GAD (generalized anxiety disorder)    Chronic and stable Continue current meds       Relevant Medications   traZODone (DESYREL) 150 MG tablet   buPROPion (WELLBUTRIN) 75 MG tablet   Moderate episode of recurrent major depressive disorder (HCC)    Chronic and stable Continue current meds       Relevant Medications   traZODone (DESYREL) 150 MG tablet   buPROPion (WELLBUTRIN) 75 MG tablet   Generalized edema    2/2 MTX Lasix prn         Return in about 6 months (around 01/20/2022) for CPE.      I, Lavon Paganini, MD, have reviewed all documentation for this visit. The documentation on 07/20/21 for the exam, diagnosis, procedures, and orders are all accurate and complete.   , Dionne Bucy, MD, MPH [facility]

## 2021-07-20 NOTE — Assessment & Plan Note (Signed)
2/2 MTX Lasix prn

## 2021-07-20 NOTE — Assessment & Plan Note (Signed)
Followed by Rheum On MTX and having side effects as below Will treat edema with prn lasix

## 2021-07-20 NOTE — Assessment & Plan Note (Signed)
Chronic and stable Continue Vyvanse at current dose

## 2021-07-26 ENCOUNTER — Telehealth: Payer: Self-pay

## 2021-07-26 NOTE — Telephone Encounter (Signed)
[name] called to cancel her surgery with Dr. Amalia Hailey on 09/02/2021. She stated she will be moving and will call us back to reschedule. Notified Dr. Amalia Hailey and Caren Griffins with [facility]

## 2021-07-27 ENCOUNTER — Ambulatory Visit: Payer: Medicaid Other | Admitting: Dermatology

## 2021-08-09 DIAGNOSIS — R4189 Other symptoms and signs involving cognitive functions and awareness: Secondary | ICD-10-CM | POA: Diagnosis not present

## 2021-08-09 DIAGNOSIS — E519 Thiamine deficiency, unspecified: Secondary | ICD-10-CM | POA: Diagnosis not present

## 2021-08-09 DIAGNOSIS — E538 Deficiency of other specified B group vitamins: Secondary | ICD-10-CM | POA: Diagnosis not present

## 2021-08-09 DIAGNOSIS — I7300 Raynaud's syndrome without gangrene: Secondary | ICD-10-CM | POA: Diagnosis not present

## 2021-08-09 DIAGNOSIS — E612 Magnesium deficiency: Secondary | ICD-10-CM | POA: Diagnosis not present

## 2021-08-09 DIAGNOSIS — Z114 Encounter for screening for human immunodeficiency virus [HIV]: Secondary | ICD-10-CM | POA: Diagnosis not present

## 2021-08-09 DIAGNOSIS — M359 Systemic involvement of connective tissue, unspecified: Secondary | ICD-10-CM | POA: Diagnosis not present

## 2021-08-09 DIAGNOSIS — Z79899 Other long term (current) drug therapy: Secondary | ICD-10-CM | POA: Diagnosis not present

## 2021-08-09 DIAGNOSIS — E559 Vitamin D deficiency, unspecified: Secondary | ICD-10-CM | POA: Diagnosis not present

## 2021-08-21 ENCOUNTER — Telehealth: Payer: Medicaid Other | Admitting: Nurse Practitioner

## 2021-08-21 DIAGNOSIS — J320 Chronic maxillary sinusitis: Secondary | ICD-10-CM | POA: Diagnosis not present

## 2021-08-21 MED ORDER — FLUCONAZOLE 150 MG PO TABS: 150.0000 mg | ORAL_TABLET | Freq: Once | ORAL | 0 refills | Status: AC

## 2021-08-21 MED ORDER — CLINDAMYCIN HCL 300 MG PO CAPS: 300.0000 mg | ORAL_CAPSULE | Freq: Three times a day (TID) | ORAL | 0 refills | Status: DC

## 2021-08-21 NOTE — Addendum Note (Signed)
Addended by: Chevis Pretty on: 08/21/2021 11:36 AM   Modules accepted: Orders

## 2021-08-21 NOTE — Progress Notes (Signed)
E-Visit for Sinus Problems  We are sorry that you are not feeling well.  Here is how we plan to help!  Based on what you have shared with me it looks like you have sinusitis.  Sinusitis is inflammation and infection in the sinus cavities of the head.  Based on your presentation I believe you most likely have Acute Bacterial Sinusitis.  This is an infection caused by bacteria and is treated with antibiotics. I have prescribed Clindamycin 300 bid for 10 days. You may use an oral decongestant such as Mucinex D or if you have glaucoma or high blood pressure use plain Mucinex. Saline nasal spray help and can safely be used as often as needed for congestion.  If you develop worsening sinus pain, fever or notice severe headache and vision changes, or if symptoms are not better after completion of antibiotic, please schedule an appointment with a health care provider.    Sinus infections are not as easily transmitted as other respiratory infection, however we still recommend that you avoid close contact with loved ones, especially the very young and elderly.  Remember to wash your hands thoroughly throughout the day as this is the number one way to prevent the spread of infection!  Home Care: Only take medications as instructed by your medical team. Complete the entire course of an antibiotic. Do not take these medications with alcohol. A steam or ultrasonic humidifier can help congestion.  You can place a towel over your head and breathe in the steam from hot water coming from a faucet. Avoid close contacts especially the very young and the elderly. Cover your mouth when you cough or sneeze. Always remember to wash your hands.  Get Help Right Away If: You develop worsening fever or sinus pain. You develop a severe head ache or visual changes. Your symptoms persist after you have completed your treatment plan.  Make sure you Understand these instructions. Will watch your condition. Will get help  right away if you are not doing well or get worse.  Thank you for choosing an e-visit.  Your e-visit answers were reviewed by a board certified advanced clinical practitioner to complete your personal care plan. Depending upon the condition, your plan could have included both over the counter or prescription medications.  Please review your pharmacy choice. Make sure the pharmacy is open so you can pick up prescription now. If there is a problem, you may contact your provider through [employer] and have the prescription routed to another pharmacy.  Your safety is important to [place]. If you have drug allergies check your prescription carefully.   For the next 24 hours you can use MyChart to ask questions about today's visit, request a non-urgent call back, or ask for a work or school excuse. You will get an email in the next two days asking about your experience. I hope that your e-visit has been valuable and will speed your recovery.  5-10 minutes spent reviewing and documenting in chart.

## 2021-08-30 DIAGNOSIS — H35711 Central serous chorioretinopathy, right eye: Secondary | ICD-10-CM | POA: Diagnosis not present

## 2021-09-09 ENCOUNTER — Encounter: Payer: Medicaid Other | Admitting: Podiatry

## 2021-09-13 ENCOUNTER — Telehealth: Payer: Self-pay | Admitting: Family Medicine

## 2021-09-13 NOTE — Telephone Encounter (Signed)
[facility]  script for  [facility] HFA 108 (90 Base) MCG/ACT inhaler 8 g 1 05/13/2021    Sig - Route: Inhale 1-2 puffs into the lungs every 6 (six) hours as needed for wheezing or shortness of breath. - Inhalation   Sent to pharmacy as: [place] 108 (90 Base) MCG/ACT inhaler   Pt states that she did not like the proair at all and pharmacy Izora Gala) states that I just need to request the Generic albuterol as pt has not had this before. An attempt was made to transfer to office but only 1 provider and was told to send CRM they are on the lookout.Pharmacy request Resend to  [facility], [address]  [place] [address]  Phone: [phone] Fax: [phone]

## 2021-09-14 NOTE — Telephone Encounter (Signed)
Pharmacist called in to follow up on previous request, caller needs to know if the generic option would be okay to give the pt, pt is out of medication, please call back.

## 2021-09-14 NOTE — Telephone Encounter (Signed)
Returned call to pharmacy. Gave okay for generic.

## 2021-09-17 ENCOUNTER — Encounter: Payer: Medicaid Other | Admitting: Podiatry

## 2021-09-17 DIAGNOSIS — H35711 Central serous chorioretinopathy, right eye: Secondary | ICD-10-CM | POA: Diagnosis not present

## 2021-09-26 ENCOUNTER — Telehealth: Payer: Medicaid Other | Admitting: Nurse Practitioner

## 2021-09-26 DIAGNOSIS — J019 Acute sinusitis, unspecified: Secondary | ICD-10-CM

## 2021-09-26 MED ORDER — CLINDAMYCIN HCL 300 MG PO CAPS: 300.0000 mg | ORAL_CAPSULE | Freq: Three times a day (TID) | ORAL | 0 refills | Status: AC

## 2021-09-26 MED ORDER — FLUCONAZOLE 150 MG PO TABS: 150.0000 mg | ORAL_TABLET | Freq: Once | ORAL | 0 refills | Status: AC

## 2021-09-26 NOTE — Progress Notes (Signed)
I have spent 5 minutes in review of e-visit questionnaire, review and updating patient chart, medical decision making and response to patient.  ° ° W , NP ° °  °

## 2021-09-26 NOTE — Progress Notes (Signed)
E-Visit for Sinus Problems  We are sorry that you are not feeling well.  Here is how we plan to help!  Based on what you have shared with me it looks like you have sinusitis.  Sinusitis is inflammation and infection in the sinus cavities of the head.  Based on your presentation I believe you most likely have Acute Bacterial Sinusitis.  This is an infection caused by bacteria and is treated with antibiotics. I have prescribed CLINDAMYCIN 300mg  three times per day for 7days.  You may use an oral decongestant such as Mucinex D or if you have glaucoma or high blood pressure use plain Mucinex. Saline nasal spray help and can safely be used as often as needed for congestion.  If you develop worsening sinus pain, fever or notice severe headache and vision changes, or if symptoms are not better after completion of antibiotic, please schedule an appointment with a health care provider.    Sinus infections are not as easily transmitted as other respiratory infection, however we still recommend that you avoid close contact with loved ones, especially the very young and elderly.  Remember to wash your hands thoroughly throughout the day as this is the number one way to prevent the spread of infection!  Home Care: Only take medications as instructed by your medical team. Complete the entire course of an antibiotic. Do not take these medications with alcohol. A steam or ultrasonic humidifier can help congestion.  You can place a towel over your head and breathe in the steam from hot water coming from a faucet. Avoid close contacts especially the very young and the elderly. Cover your mouth when you cough or sneeze. Always remember to wash your hands.  Get Help Right Away If: You develop worsening fever or sinus pain. You develop a severe head ache or visual changes. Your symptoms persist after you have completed your treatment plan.  Make sure you Understand these instructions. Will watch your  condition. Will get help right away if you are not doing well or get worse.  Thank you for choosing an e-visit.  Your e-visit answers were reviewed by a board certified advanced clinical practitioner to complete your personal care plan. Depending upon the condition, your plan could have included both over the counter or prescription medications.  Please review your pharmacy choice. Make sure the pharmacy is open so you can pick up prescription now. If there is a problem, you may contact your provider through [employer] and have the prescription routed to another pharmacy.  Your safety is important to [place]. If you have drug allergies check your prescription carefully.   For the next 24 hours you can use MyChart to ask questions about today's visit, request a non-urgent call back, or ask for a work or school excuse. You will get an email in the next two days asking about your experience. I hope that your e-visit has been valuable and will speed your recovery.

## 2021-10-01 ENCOUNTER — Encounter: Payer: Medicaid Other | Admitting: Podiatry

## 2021-10-04 ENCOUNTER — Other Ambulatory Visit: Payer: Self-pay | Admitting: Family Medicine

## 2021-10-04 DIAGNOSIS — F411 Generalized anxiety disorder: Secondary | ICD-10-CM

## 2021-10-04 DIAGNOSIS — F331 Major depressive disorder, recurrent, moderate: Secondary | ICD-10-CM

## 2021-10-04 NOTE — Telephone Encounter (Signed)
Requested Prescriptions  Pending Prescriptions Disp Refills  . buPROPion (WELLBUTRIN) 75 MG tablet [Pharmacy Med Name: BUPROPION HCL 75 MG TAB] 270 tablet 1    Sig: TAKE 2 TABLETS BY MOUTH EVERY MORNING AND 1 TABLET EVERY DAY AFTER LUNCH     Psychiatry: Antidepressants - bupropion Passed - 10/04/2021 12:11 PM      Passed - Completed PHQ-2 or PHQ-9 in the last 360 days      Passed - Last BP in normal range    BP Readings from Last 1 Encounters:  07/20/21 117/84         Passed - Valid encounter within last 6 months    Recent Outpatient Visits          2 months ago Rheumatoid arthritis, involving unspecified site, unspecified whether rheumatoid factor present [facility])   [facility] [place], Dionne Bucy, MD   4 months ago Chronic fatigue   [facility] [place], Dionne Bucy, MD   5 months ago Hypothyroidism, unspecified type   [place], Vickki Muff, PA-C   9 months ago Other [place], Jennifer M, [place]   10 months ago Left elbow pain   [place] [facility], Kelby Aline, FNP      Future Appointments            In 3 months Bacigalupo, Dionne Bucy, MD [facility], [place]

## 2021-10-06 DIAGNOSIS — F900 Attention-deficit hyperactivity disorder, predominantly inattentive type: Secondary | ICD-10-CM | POA: Diagnosis not present

## 2021-10-06 DIAGNOSIS — F332 Major depressive disorder, recurrent severe without psychotic features: Secondary | ICD-10-CM | POA: Diagnosis not present

## 2021-10-06 DIAGNOSIS — F4310 Post-traumatic stress disorder, unspecified: Secondary | ICD-10-CM | POA: Diagnosis not present

## 2021-10-07 ENCOUNTER — Ambulatory Visit: Payer: Self-pay | Admitting: *Deleted

## 2021-10-07 NOTE — Telephone Encounter (Signed)
Attempted to return pt's call.  Left voicemail to return the call and any of the nurses can assist her.

## 2021-10-07 NOTE — Telephone Encounter (Signed)
Pt calling in.   Pt is c/o swollen lymph nodes, lossing weight, hair loss and fatigue.   Requesting an appt.    Reason for Disposition . [1] Hair thinning, hair loss, or balding AND [2] cause not known (e.g., no recent precipitating factors such as childbirth, weight loss surgery)  Answer Assessment - Initial Assessment Questions 1. LOCATION: "Where is the hair loss?" (e.g., all of scalp, parts of scalp, back of head or neck)     It's all over my head  2. DESCRIPTION: "Please describe it.? (e.g., thinning of hair, balding, patches of hair missing)     Going on a couple of months 3. ONSET: "When did the hair loss begin?" (e.g., sudden or gradual onset; days, weeks, months or years ago)     A couple of months 4. OTHER SYMPTOMS: "What does the scalp look like where the hair is missing?" (e.g., normal, redness, crusts, scarring)     I see it in my hairbrush 5. OTHER FACTORS: "Have you had any of the following recently: childbirth, severe illness or injury, major surgery, major weight loss, cancer chemo, tight hair braids, serious stress?"     Autoimmune disorder 6. CAUSE: "What do you think is causing the hair loss?"     Autoimmune disorder.  Protocols used: Hair Loss-A-AH

## 2021-10-11 ENCOUNTER — Ambulatory Visit: Payer: Medicaid Other | Admitting: Family Medicine

## 2021-10-12 DIAGNOSIS — E039 Hypothyroidism, unspecified: Secondary | ICD-10-CM | POA: Diagnosis not present

## 2021-10-12 DIAGNOSIS — Z880 Allergy status to penicillin: Secondary | ICD-10-CM | POA: Diagnosis not present

## 2021-10-12 DIAGNOSIS — J342 Deviated nasal septum: Secondary | ICD-10-CM | POA: Diagnosis not present

## 2021-10-12 DIAGNOSIS — D649 Anemia, unspecified: Secondary | ICD-10-CM | POA: Diagnosis not present

## 2021-10-12 DIAGNOSIS — M25561 Pain in right knee: Secondary | ICD-10-CM | POA: Diagnosis not present

## 2021-10-12 DIAGNOSIS — S8991XA Unspecified injury of right lower leg, initial encounter: Secondary | ICD-10-CM | POA: Diagnosis not present

## 2021-10-12 DIAGNOSIS — J329 Chronic sinusitis, unspecified: Secondary | ICD-10-CM | POA: Diagnosis not present

## 2021-10-12 DIAGNOSIS — Z882 Allergy status to sulfonamides status: Secondary | ICD-10-CM | POA: Diagnosis not present

## 2021-10-26 ENCOUNTER — Other Ambulatory Visit (HOSPITAL_COMMUNITY): Payer: Self-pay | Admitting: Nurse Practitioner

## 2021-10-26 ENCOUNTER — Other Ambulatory Visit: Payer: Self-pay | Admitting: Nurse Practitioner

## 2021-10-26 ENCOUNTER — Other Ambulatory Visit: Payer: Self-pay | Admitting: Family Medicine

## 2021-10-26 DIAGNOSIS — M25561 Pain in right knee: Secondary | ICD-10-CM

## 2021-10-26 NOTE — Telephone Encounter (Signed)
Requested medication (s) are due for refill today: yes  Requested medication (s) are on the active medication list: yes  Last refill:  07/20/21 #30 0 refills  Future visit scheduled: yes in 3 months  Notes to clinic:  not delegated per protocol     Requested Prescriptions  Pending Prescriptions Disp Refills   VYVANSE 50 MG capsule [Pharmacy Med Name: VYVANSE 50 MG CAP] 30 capsule     Sig: TAKE 1 CAPSULE BY MOUTH DAILY     Not Delegated - Psychiatry:  Stimulants/ADHD Failed - 10/26/2021 11:33 AM      Failed - This refill cannot be delegated      Failed - Urine Drug Screen completed in last 360 days      Failed - Valid encounter within last 3 months    Recent Outpatient Visits           3 months ago Rheumatoid arthritis, involving unspecified site, unspecified whether rheumatoid factor present [facility])   [facility] [place], Dionne Bucy, MD   5 months ago Chronic fatigue   [facility] [place], Dionne Bucy, MD   5 months ago Hypothyroidism, unspecified type   [place], Vickki Muff, PA-C   9 months ago Other [place], [place]   11 months ago Left elbow pain   [place] [facility], Kelby Aline, FNP       Future Appointments             In 3 months Bacigalupo, Dionne Bucy, MD [facility], [place]

## 2021-10-26 NOTE — Telephone Encounter (Signed)
LOV: 07/20/2021 Last Refill:07/20/21  qty:30 Future Visit:01/24/22

## 2021-10-28 ENCOUNTER — Ambulatory Visit: Payer: Medicaid Other

## 2021-10-28 ENCOUNTER — Other Ambulatory Visit: Payer: Self-pay | Admitting: Family Medicine

## 2021-10-28 NOTE — Telephone Encounter (Signed)
Requested medication (s) are due for refill today: No  Requested medication (s) are on the active medication list: Yes  Last refill:  10/28/21 #30/0RF  Future visit scheduled: Yes  Notes to clinic:  Unable to refill per protocol, cannot delegate. Was filled today     Requested Prescriptions  Pending Prescriptions Disp Refills   VYVANSE 50 MG capsule [Pharmacy Med Name: VYVANSE 50 MG CAP] 30 capsule     Sig: TAKE 1 CAPSULE BY MOUTH DAILY     Not Delegated - Psychiatry:  Stimulants/ADHD Failed - 10/28/2021 10:46 AM      Failed - This refill cannot be delegated      Failed - Urine Drug Screen completed in last 360 days      Failed - Valid encounter within last 3 months    Recent Outpatient Visits           3 months ago Rheumatoid arthritis, involving unspecified site, unspecified whether rheumatoid factor present [facility])   [facility] [place], Dionne Bucy, MD   5 months ago Chronic fatigue   [facility], Dionne Bucy, MD   5 months ago Hypothyroidism, unspecified type   [place], Vickki Muff, PA-C   9 months ago Other [place], [place]   11 months ago Left elbow pain   [place] [facility], Kelby Aline, FNP       Future Appointments             In 2 months Bacigalupo, Dionne Bucy, MD [facility], [place]

## 2021-10-29 NOTE — Telephone Encounter (Signed)
Medication was refilled on 10/28/2021. Refill not required today.

## 2021-11-01 ENCOUNTER — Other Ambulatory Visit: Payer: Self-pay | Admitting: Family Medicine

## 2021-11-01 NOTE — Telephone Encounter (Signed)
Copied from [place] [phone]. Topic: Quick Communication - Rx Refill/Question >> Nov 01, 2021  4:39 PM Tessa Lerner A wrote: Medication: VYVANSE 50 MG capsule [[id number]]   Has the patient contacted their pharmacy? Yes.  The patient has been directed to contact their PCP. The pharmacy had a power outage and didn't receive the prescription.  (Agent: If no, request that the patient contact the pharmacy for the refill. If patient does not wish to contact the pharmacy document the reason why and proceed with request.) (Agent: If yes, when and what did the pharmacy advise?)  Preferred Pharmacy (with phone number or street name): [facility], [place]  Phone:  [phone] Fax:  [phone]  Has the patient been seen for an appointment in the last year OR does the patient have an upcoming appointment? Yes.    Agent: Please be advised that RX refills may take up to 3 business days. We ask that you follow-up with your pharmacy.

## 2021-11-02 ENCOUNTER — Ambulatory Visit: Payer: Medicaid Other

## 2021-11-02 ENCOUNTER — Ambulatory Visit: Payer: Self-pay | Admitting: *Deleted

## 2021-11-02 MED ORDER — LISDEXAMFETAMINE DIMESYLATE 50 MG PO CAPS: 50.0000 mg | ORAL_CAPSULE | Freq: Every day | ORAL | 0 refills | Status: DC

## 2021-11-02 NOTE — Telephone Encounter (Signed)
Requested medication (s) are due for refill today:   Provider to review  Requested medication (s) are on the active medication list:   Yes  Future visit scheduled:   Yes   Last ordered: 10/28/2021 #30, 0 refills  Non delegated refill   Requested Prescriptions  Pending Prescriptions Disp Refills   lisdexamfetamine (VYVANSE) 50 MG capsule 30 capsule 0    Sig: Take 1 capsule (50 mg total) by mouth daily.     Not Delegated - Psychiatry:  Stimulants/ADHD Failed - 11/01/2021  5:43 PM      Failed - This refill cannot be delegated      Failed - Urine Drug Screen completed in last 360 days      Failed - Valid encounter within last 3 months    Recent Outpatient Visits           3 months ago Rheumatoid arthritis, involving unspecified site, unspecified whether rheumatoid factor present [facility])   [facility] [place], Dionne Bucy, MD   5 months ago Chronic fatigue   [facility] [place], Dionne Bucy, MD   6 months ago Hypothyroidism, unspecified type   [place], Vickki Muff, PA-C   10 months ago Other [place], [place]   11 months ago Left elbow pain   [place] Flinchum, Kelby Aline, FNP       Future Appointments             In 2 months Bacigalupo, Dionne Bucy, MD [facility], [place]

## 2021-11-02 NOTE — Telephone Encounter (Signed)
I did not need to speak with the pharmacy that was calling in.   [facility].   They are calling in regarding Vyvanse not being at the pharmacy.    I referred the agent to direct the call to the practice because we aren't allowed to do anything with this medication since it's a controlled substance and is not delegated per our protocol.

## 2021-11-02 NOTE — Telephone Encounter (Signed)
Pharmacy did not receive medication that was sent in  *E-Prescribing Status: Transmission to pharmacy failed (10/28/2021  8:17 AM EST)   Please advise.

## 2021-11-08 ENCOUNTER — Telehealth: Payer: Medicaid Other | Admitting: Family Medicine

## 2021-11-08 DIAGNOSIS — J320 Chronic maxillary sinusitis: Secondary | ICD-10-CM

## 2021-11-08 MED ORDER — CLINDAMYCIN HCL 300 MG PO CAPS: 300.0000 mg | ORAL_CAPSULE | Freq: Three times a day (TID) | ORAL | 0 refills | Status: AC

## 2021-11-08 MED ORDER — FLUCONAZOLE 150 MG PO TABS: 150.0000 mg | ORAL_TABLET | Freq: Once | ORAL | 0 refills | Status: AC

## 2021-11-08 NOTE — Progress Notes (Signed)
E-Visit for Sinus Problems  We are sorry that you are not feeling well.  Here is how we plan to help!  Based on what you have shared with me it looks like you have sinusitis.  Sinusitis is inflammation and infection in the sinus cavities of the head.  Based on your presentation I believe you most likely have Acute Bacterial Sinusitis.  This is an infection caused by bacteria and is treated with antibiotics. I have prescribed Clindamycin 300 mg 3 times daily for 7 days and Diflucan as well per previous request You may use an oral decongestant such as Mucinex D or if you have glaucoma or high blood pressure use plain Mucinex. Saline nasal spray help and can safely be used as often as needed for congestion.  If you develop worsening sinus pain, fever or notice severe headache and vision changes, or if symptoms are not better after completion of antibiotic, please schedule an appointment with a health care provider.    Sinus infections are not as easily transmitted as other respiratory infection, however we still recommend that you avoid close contact with loved ones, especially the very young and elderly.  Remember to wash your hands thoroughly throughout the day as this is the number one way to prevent the spread of infection!  Home Care: Only take medications as instructed by your medical team. Complete the entire course of an antibiotic. Do not take these medications with alcohol. A steam or ultrasonic humidifier can help congestion.  You can place a towel over your head and breathe in the steam from hot water coming from a faucet. Avoid close contacts especially the very young and the elderly. Cover your mouth when you cough or sneeze. Always remember to wash your hands.  Get Help Right Away If: You develop worsening fever or sinus pain. You develop a severe head ache or visual changes. Your symptoms persist after you have completed your treatment plan.  Make sure you Understand these  instructions. Will watch your condition. Will get help right away if you are not doing well or get worse.  Thank you for choosing an e-visit.  Your e-visit answers were reviewed by a board certified advanced clinical practitioner to complete your personal care plan. Depending upon the condition, your plan could have included both over the counter or prescription medications.  Please review your pharmacy choice. Make sure the pharmacy is open so you can pick up prescription now. If there is a problem, you may contact your provider through [employer] and have the prescription routed to another pharmacy.  Your safety is important to [place]. If you have drug allergies check your prescription carefully.   For the next 24 hours you can use MyChart to ask questions about today's visit, request a non-urgent call back, or ask for a work or school excuse. You will get an email in the next two days asking about your experience. I hope that your e-visit has been valuable and will speed your recovery.  I provided 5 minutes of non face-to-face time during this encounter for chart review, medication and order placement, as well as and documentation.

## 2021-11-09 ENCOUNTER — Ambulatory Visit: Payer: Medicaid Other

## 2021-11-09 DIAGNOSIS — M2391 Unspecified internal derangement of right knee: Secondary | ICD-10-CM | POA: Diagnosis not present

## 2021-11-10 DIAGNOSIS — Z7960 Long term (current) use of unspecified immunomodulators and immunosuppressants: Secondary | ICD-10-CM | POA: Diagnosis not present

## 2021-11-10 DIAGNOSIS — M359 Systemic involvement of connective tissue, unspecified: Secondary | ICD-10-CM | POA: Diagnosis not present

## 2021-11-12 ENCOUNTER — Telehealth: Payer: Self-pay

## 2021-11-12 DIAGNOSIS — E279 Disorder of adrenal gland, unspecified: Secondary | ICD-10-CM | POA: Diagnosis not present

## 2021-11-12 DIAGNOSIS — E221 Hyperprolactinemia: Secondary | ICD-10-CM | POA: Diagnosis not present

## 2021-11-12 DIAGNOSIS — R7989 Other specified abnormal findings of blood chemistry: Secondary | ICD-10-CM | POA: Diagnosis not present

## 2021-11-12 NOTE — Telephone Encounter (Signed)
Copied from [place] [id number]. Topic: Appointment Scheduling - Scheduling Inquiry for Clinic >> Nov 12, 2021  4:26 PM Pawlus, Brayton Layman A wrote: Reason for CRM: Pt was informed by her endocrinologist to follow up to get a nod on her neck looked at, pt wanted to be worked in for an office visit if possible, please advise.

## 2021-11-13 ENCOUNTER — Other Ambulatory Visit: Payer: Self-pay | Admitting: Family Medicine

## 2021-11-14 NOTE — Telephone Encounter (Signed)
Requested Prescriptions  Pending Prescriptions Disp Refills  . furosemide (LASIX) 20 MG tablet [Pharmacy Med Name: FUROSEMIDE 20 MG TAB] 30 tablet 3    Sig: TAKE 1 TABLET BY MOUTH DAILY AS NEEDED FOR EDEMA.     Cardiovascular:  Diuretics - Loop Failed - 11/13/2021 11:51 AM      Failed - Cr in normal range and within 360 days    Creat  Date Value Ref Range Status  04/10/2018 0.99 0.50 - 1.10 mg/dL Final   Creatinine, Ser  Date Value Ref Range Status  05/04/2021 1.10 (H) 0.57 - 1.00 mg/dL Final         Passed - K in normal range and within 360 days    Potassium  Date Value Ref Range Status  05/04/2021 4.5 3.5 - 5.2 mmol/L Final         Passed - Ca in normal range and within 360 days    Calcium  Date Value Ref Range Status  05/04/2021 9.2 8.7 - 10.2 mg/dL Final         Passed - Na in normal range and within 360 days    Sodium  Date Value Ref Range Status  05/04/2021 134 134 - 144 mmol/L Final         Passed - Last BP in normal range    BP Readings from Last 1 Encounters:  07/20/21 117/84         Passed - Valid encounter within last 6 months    Recent Outpatient Visits          3 months ago Rheumatoid arthritis, involving unspecified site, unspecified whether rheumatoid factor present [facility])   [facility] [place], Dionne Bucy, MD   6 months ago Chronic fatigue   [facility] [place], Dionne Bucy, MD   6 months ago Hypothyroidism, unspecified type   [place], Vickki Muff, PA-C   10 months ago Other [place], Jennifer M, [place]   11 months ago Left elbow pain   [place] Flinchum, Kelby Aline, FNP      Future Appointments            In 2 months Bacigalupo, Dionne Bucy, MD [facility], [place]

## 2021-11-15 NOTE — Telephone Encounter (Signed)
Can see if anyone has availability this week.

## 2021-11-15 NOTE — Telephone Encounter (Signed)
Schedule with Ria Comment 11/17/21 9:40am

## 2021-11-15 NOTE — Telephone Encounter (Signed)
Can schedule with Mikey Kirschner, PA or Dr. Ky Barban.

## 2021-11-17 ENCOUNTER — Encounter: Payer: Self-pay | Admitting: Physician Assistant

## 2021-11-17 ENCOUNTER — Ambulatory Visit (INDEPENDENT_AMBULATORY_CARE_PROVIDER_SITE_OTHER): Payer: Medicaid Other | Admitting: Physician Assistant

## 2021-11-17 ENCOUNTER — Other Ambulatory Visit: Payer: Self-pay

## 2021-11-17 VITALS — BP 104/79 | HR 103 | Temp 98.5°F | Ht 67.0 in | Wt 138.0 lb

## 2021-11-17 DIAGNOSIS — B351 Tinea unguium: Secondary | ICD-10-CM

## 2021-11-17 DIAGNOSIS — R590 Localized enlarged lymph nodes: Secondary | ICD-10-CM

## 2021-11-17 MED ORDER — TERBINAFINE HCL 250 MG PO TABS: 250.0000 mg | ORAL_TABLET | Freq: Every day | ORAL | 1 refills | Status: DC

## 2021-11-17 NOTE — Progress Notes (Signed)
Date:  11/17/2021   Name:  [name]   DOB:  [birth year]   MRN:  [id number]   Chief Complaint: lump left side of neck  [name] is a 45 y/o female who presents today for evaluation of a mass in the left side of her neck for the last 2 to 3 months.  She states it felt tender which led her to explore the area.  Describes it as a size of an almond.  Tender to touch if she starts really examining it.  Denies any growth over the last 2 to 3 months but has not shrunk in size either.  She does admit to a chronic cough but has a history of asthma.  No change over the last 2 to 3 months.  She has noticed some night sweats which have been chronic since she started perimenopause in her early 58s.  Typically controlled with replacement estrogen, but for the last few months maybe 1 to 3 months she has felt "warm at night" sometimes sweats through her pajamas.  No history of thyroid disorder, no difficulty swallowing.    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  She was taking for quite a while terbinafine regularly which seem to allow her toenails to grow again and no fungus is visible.  She has not taken in the last 2 to 4 months or so and has noticed a change in her toenail texture and growth.  She would like to continue this medication.  Lab Results  Component Value Date   NA 134 05/04/2021   K 4.5 05/04/2021   CO2 21 05/04/2021   GLUCOSE 88 05/04/2021   BUN 16 05/04/2021   CREATININE 1.10 (H) 05/04/2021   CALCIUM 9.2 05/04/2021   EGFR 63 05/04/2021   GFRNONAA 70 11/17/2020   Lab Results  Component Value Date   CHOL 234 (H) 11/17/2020   HDL 72 11/17/2020   LDLCALC 146 (H) 11/17/2020   TRIG 94 11/17/2020   CHOLHDL 3.3 11/17/2020   Lab Results  Component Value Date   TSH 2.630 05/04/2021   No results found for: HGBA1C Lab Results  Component Value Date   WBC 5.4 05/04/2021   HGB 13.3 05/04/2021   HCT 39.9 05/04/2021   MCV 99 (H)  05/04/2021   PLT 270 05/04/2021   Lab Results  Component Value Date   ALT 13 05/04/2021   AST 16 05/04/2021   GGT 13 02/19/2016   ALKPHOS 51 05/04/2021   BILITOT 0.5 05/04/2021   Lab Results  Component Value Date   VD25OH 62.8 05/04/2021     Review of Systems  Constitutional:  Positive for diaphoresis and fatigue. Negative for fever.  HENT:  Negative for trouble swallowing.   Respiratory:  Positive for cough and wheezing.   Cardiovascular:  Negative for chest pain and leg swelling.  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   Conductive hearing loss of left ear with unrestricted hearing of right ear 06/15/2021   Tinnitus of both ears 06/15/2021   Chronic fatigue 05/11/2021   Chronic gout of right hand 05/11/2021   Left elbow pain 11/30/2020   Cervical disc disorder with myelopathy of mid-cervical region 07/09/2020   Cervical radiculitis 05/13/2020   Sprain of interphalangeal joint of finger 02/19/2020  Chronic constipation 12/31/2019   Deviated nasal septum 09/24/2019   Obstruction of nasal valve 09/24/2019   Chronic rhinitis 09/24/2019   GAD (generalized anxiety disorder) 03/15/2019   Moderate episode of recurrent major depressive disorder ([place]) 03/15/2019   Rheumatoid arthritis ([place]) 11/01/2018   Primary osteoarthritis of both hands 05/11/2018   Primary osteoarthritis of both feet 05/11/2018   Raynaud's disease without gangrene 05/11/2018   Asthma 04/20/2018   Family history of rheumatoid arthritis 04/10/2018   Dry mouth 01/23/2018   PTSD (post-traumatic stress disorder) 10/16/2017   Insomnia 10/16/2017   Migraines 10/16/2017   ADHD (attention deficit hyperactivity disorder)    Uterine leiomyoma 09/22/2017   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   Hypothyroidism 03/30/2017   Cervical spondylosis with myelopathy 06/12/2015    Allergies   Allergen Reactions   Penicillins Swelling    Pt states caused throat to swell    Sulfa Antibiotics Other (See Comments)    Lips tingled    Past Surgical History:  Procedure Laterality Date   ARTHROPLASTY  10/02/2020   C5-6 C6-7    BACK SURGERY     CESAREAN SECTION     DILATION AND CURETTAGE OF UTERUS     (x2)   FRONTAL SINUS EXPLORATION Bilateral 11/19/2015   Procedure: FRONTAL SINUS EXPLORATION;  Surgeon: Margaretha Sheffield, MD;  Location: [place];  Service: ENT;  Laterality: Bilateral;   IMAGE GUIDED SINUS SURGERY N/A 11/19/2015   Procedure: IMAGE GUIDED SINUS SURGERY;  Surgeon: Margaretha Sheffield, MD;  Location: [place];  Service: ENT;  Laterality: N/A;  GAVE DISK TO CE CE 09/22   MAXILLARY ANTROSTOMY Bilateral 11/19/2015   Procedure: MAXILLARY ANTROSTOMY;  Surgeon: Margaretha Sheffield, MD;  Location: [place];  Service: ENT;  Laterality: Bilateral;   SEPTOPLASTY N/A 11/19/2015   Procedure: SEPTOPLASTY;  Surgeon: Margaretha Sheffield, MD;  Location: [place];  Service: ENT;  Laterality: N/A;   SPHENOIDECTOMY Bilateral 11/19/2015   Procedure: Coralee [place];  Surgeon: Margaretha Sheffield, MD;  Location: [place];  Service: ENT;  Laterality: Bilateral;   TONSILLECTOMY      Social History   Tobacco Use   Smoking status: Never   Smokeless tobacco: Never  Vaping Use   Vaping Use: Never used  Substance Use Topics   Alcohol use: Yes    Alcohol/week: 0.0 standard drinks    Comment: rare- wine    Drug use: Never     Medication list has been reviewed and updated.  Outpatient Encounter Medications as of 11/17/2021  Medication Sig   albuterol (ACCUNEB) 0.63 MG/3ML nebulizer solution Take 1 ampule by nebulization every 6 (six) hours as needed for wheezing.   buPROPion (WELLBUTRIN) 75 MG tablet TAKE 2 TABLETS BY MOUTH EVERY MORNING AND 1 TABLET EVERY DAY AFTER LUNCH   ELIDEL 1 % cream Apply topically 2 (two) times daily.   eplerenone (INSPRA) 25 MG tablet  Take 1 tablet by mouth 2 (two) times daily.   estradiol (ESTRACE) 1 MG tablet Take 1 mg by mouth daily.   folic acid (FOLVITE) 1 MG tablet Take 1 mg by mouth daily.   furosemide (LASIX) 20 MG tablet TAKE 1 TABLET BY MOUTH DAILY AS NEEDED FOR EDEMA.   LINZESS 145 MCG CAPS capsule Take 145 mcg by mouth daily.   lisdexamfetamine (VYVANSE) 50 MG capsule Take 1 capsule (50 mg total) by mouth daily.   lisdexamfetamine (VYVANSE) 50 MG capsule Take 1 capsule (50 mg total) by mouth daily.   lisdexamfetamine (VYVANSE) 50  MG capsule Take 1 capsule (50 mg total) by mouth daily.   methocarbamol (ROBAXIN) 500 MG tablet Take by mouth as needed.   methotrexate (RHEUMATREX) 2.5 MG tablet 15 mg once a week. Every Monday 5 tablets a day   mometasone (ELOCON) 0.1 % cream Apply topically.   naratriptan (AMERGE) 2.5 MG tablet Take 1 tablet (2.5 mg total) by mouth as needed for migraine. Take one (1) tablet at onset of headache; if returns or does not resolve, may repeat after 4 hours; do not exceed five (5) mg in 24 hours.   norethindrone-ethinyl estradiol-iron (LOESTRIN FE) 1.5-30 MG-MCG tablet Take 1 tablet by mouth daily.   PROAIR HFA 108 (90 Base) MCG/ACT inhaler Inhale 1-2 puffs into the lungs every 6 (six) hours as needed for wheezing or shortness of breath.   SUMAtriptan (IMITREX) 100 MG tablet TK 1 T PO AOS OF HEADACHE. REPEAT IN 2 H AS NEEDED. MAX DOSE 2 T IN 24 H   traZODone (DESYREL) 150 MG tablet Take 0.5-1 tablets (75-150 mg total) by mouth at bedtime as needed for sleep. (Patient taking differently: Take 75-150 mg by mouth daily.)   [DISCONTINUED] fluticasone-salmeterol (ADVAIR) 250-50 MCG/ACT AEPB    [DISCONTINUED] levocetirizine (XYZAL) 5 MG tablet Take 5 mg by mouth daily.   [DISCONTINUED] montelukast (SINGULAIR) 10 MG tablet Take by mouth.   terbinafine (LAMISIL) 250 MG tablet Take 1 tablet (250 mg total) by mouth daily.   [DISCONTINUED] terbinafine (LAMISIL) 250 MG tablet Take 1 tablet (250 mg  total) by mouth daily. (Patient not taking: Reported on 11/17/2021)   No facility-administered encounter medications on file as of 11/17/2021.     PHQ 2/9 Scores 11/17/2021 05/11/2021 05/04/2021 11/17/2020  PHQ - 2 Score '2 2 3 2  ' PHQ- 9 Score '13 10 10 9    ' GAD 7 : Generalized Anxiety Score 08/21/2020 08/22/2019 05/16/2019 04/12/2019  Nervous, Anxious, on Edge '1 1 2 1  ' Control/stop worrying 0 1 1 0  Worry too much - different things 0 1 1 0  Trouble relaxing '1 1 1 2  ' Restless 0 '1 1 2  ' Easily annoyed or irritable '1 2 3 3  ' Afraid - awful might happen 0 0 1 0  Total GAD 7 Score '3 7 10 8  ' Anxiety Difficulty Not difficult at all Somewhat difficult Somewhat difficult Somewhat difficult    BP Readings from Last 3 Encounters:  11/17/21 104/79  07/20/21 117/84  06/03/21 112/75    Physical Exam Constitutional:      General: She is awake.     Appearance: She is well-developed.  HENT:     Head: Normocephalic.  Eyes:     Conjunctiva/sclera: Conjunctivae normal.  Neck:     Comments: Left tonsillar/ anterior cervical lymphadenopathy-- a singular node, mobile, soft, but 3-4 cm in size. Cardiovascular:     Rate and Rhythm: Normal rate and regular rhythm.     Heart sounds: Normal heart sounds.  Pulmonary:     Effort: Pulmonary effort is normal.     Breath sounds: Normal breath sounds.  Feet:     Right foot:     Skin integrity: Skin integrity normal.     Toenail Condition: Right toenails are abnormally thick.     Left foot:     Skin integrity: Skin integrity normal.     Toenail Condition: Left toenails are abnormally thick.  Lymphadenopathy:     Cervical: Cervical adenopathy present.  Skin:    General: Skin is warm.  Neurological:  Mental Status: She is alert and oriented to person, place, and time.  Psychiatric:        Attention and Perception: Attention normal.        Mood and Affect: Mood normal.        Speech: Speech normal.        Behavior: Behavior is cooperative.    Wt  Readings from Last 3 Encounters:  11/17/21 138 lb (62.6 kg)  07/20/21 143 lb 3.2 oz (65 kg)  05/11/21 142 lb (64.4 kg)    BP 104/79   Pulse (!) 103   Temp 98.5 F (36.9 C) (Oral)   Ht '5\' 7"'  (1.702 m)   Wt 138 lb (62.6 kg)   SpO2 100%   BMI 21.61 kg/m   Assessment and Plan:  Anterior, left, cervical adenopathy Does not feel suspicious, but is asymmetry, singular adenopathy Advised targeted ultrasound for further diagnostic evaluation  2. Onchomyosis, bilateral toes Not really visible today as patient has toenail polish on. Can restart terbinafine. Historically LFTs normal w/ MTX. Will monitor, rec CMP in 3-4 mo.  I, Mikey Kirschner, PA-C have reviewed all documentation for this visit. The documentation on  11/17/2021 for the exam, diagnosis, procedures, and orders are all accurate and complete.  Mikey Kirschner, PA-C [facility] [address] [place], [place], [address] Office: [phone] Fax: [phone]

## 2021-11-24 ENCOUNTER — Encounter: Payer: Self-pay | Admitting: Family Medicine

## 2021-11-26 ENCOUNTER — Ambulatory Visit: Payer: Medicaid Other

## 2021-11-29 ENCOUNTER — Other Ambulatory Visit: Payer: Self-pay | Admitting: Family Medicine

## 2021-11-29 ENCOUNTER — Telehealth: Payer: Self-pay | Admitting: Family Medicine

## 2021-11-29 NOTE — Telephone Encounter (Signed)
Patient left letter with form from Dr Berniece Andreas to sign for work release from covid form. She wants to know when she can pick it up. Please call back.

## 2021-11-30 ENCOUNTER — Other Ambulatory Visit: Payer: Self-pay | Admitting: Family Medicine

## 2021-11-30 NOTE — Telephone Encounter (Signed)
Requested medication (s) are due for refill today: yes  Requested medication (s) are on the active medication list: yes  Last refill:  11/02/21  Future visit scheduled: 01/24/22  Notes to clinic:  This medication can not be delegated, please assess.    Requested Prescriptions  Pending Prescriptions Disp Refills   VYVANSE 50 MG capsule [Pharmacy Med Name: VYVANSE 50 MG CAP] 30 capsule     Sig: TAKE 1 CAPSULE BY MOUTH DAILY     Not Delegated - Psychiatry:  Stimulants/ADHD Failed - 11/29/2021 12:43 PM      Failed - This refill cannot be delegated      Failed - Urine Drug Screen completed in last 360 days      Passed - Valid encounter within last 3 months    Recent Outpatient Visits           1 week ago Adenopathy, cervical   [facility] Mikey Kirschner, PA-C   4 months ago Rheumatoid arthritis, involving unspecified site, unspecified whether rheumatoid factor present [facility])   [facility] [place], Dionne Bucy, MD   6 months ago Chronic fatigue   [facility] [place], Dionne Bucy, MD   7 months ago Hypothyroidism, unspecified type   [place], Vickki Muff, PA-C   11 months ago Other [place], Clearnce Sorrel, [place]       Future Appointments             In 1 month Bacigalupo, Dionne Bucy, MD [facility], [place]

## 2021-12-02 ENCOUNTER — Ambulatory Visit: Payer: Medicaid Other

## 2021-12-08 ENCOUNTER — Ambulatory Visit: Payer: Medicaid Other

## 2021-12-20 ENCOUNTER — Ambulatory Visit: Payer: Medicaid Other

## 2021-12-20 ENCOUNTER — Ambulatory Visit: Admission: RE | Admit: 2021-12-20 | Payer: Medicaid Other | Source: Ambulatory Visit

## 2021-12-27 ENCOUNTER — Ambulatory Visit
Admission: RE | Admit: 2021-12-27 | Discharge: 2021-12-27 | Disposition: A | Discharge status: Home / Self Care | Payer: Medicaid Other | Source: Ambulatory Visit | Attending: Physician Assistant | Admitting: Physician Assistant

## 2021-12-27 ENCOUNTER — Other Ambulatory Visit: Payer: Self-pay

## 2021-12-27 DIAGNOSIS — R590 Localized enlarged lymph nodes: Secondary | ICD-10-CM | POA: Insufficient documentation

## 2021-12-29 ENCOUNTER — Telehealth: Payer: Medicaid Other | Admitting: Nurse Practitioner

## 2021-12-29 DIAGNOSIS — T3695XA Adverse effect of unspecified systemic antibiotic, initial encounter: Secondary | ICD-10-CM | POA: Diagnosis not present

## 2021-12-29 DIAGNOSIS — J0140 Acute pansinusitis, unspecified: Secondary | ICD-10-CM | POA: Diagnosis not present

## 2021-12-29 DIAGNOSIS — B379 Candidiasis, unspecified: Secondary | ICD-10-CM

## 2021-12-29 MED ORDER — CLINDAMYCIN HCL 300 MG PO CAPS: 300.0000 mg | ORAL_CAPSULE | Freq: Three times a day (TID) | ORAL | 0 refills | Status: AC

## 2021-12-29 MED ORDER — FLUCONAZOLE 150 MG PO TABS: 150.0000 mg | ORAL_TABLET | Freq: Once | ORAL | 0 refills | Status: AC

## 2021-12-29 NOTE — Progress Notes (Signed)
E-Visit for Sinus Problems  We are sorry that you are not feeling well.  Here is how we plan to help!  Based on what you have shared with me it looks like you have sinusitis.  Sinusitis is inflammation and infection in the sinus cavities of the head.  Based on your presentation I believe you most likely have Acute Bacterial Sinusitis.  This is an infection caused by bacteria and is treated with antibiotics. I have prescribed Clindamycin. We will also send in the Diflucan as requested if you are to experience yeast symptoms while taking the antibiotics.   Meds ordered this encounter  Medications   fluconazole (DIFLUCAN) 150 MG tablet    Sig: Take 1 tablet (150 mg total) by mouth once for 1 dose. May repeat after 72 hours if needed    Dispense:  2 tablet    Refill:  0   clindamycin (CLEOCIN) 300 MG capsule    Sig: Take 1 capsule (300 mg total) by mouth 3 (three) times daily for 7 days.    Dispense:  21 capsule    Refill:  0     You may use an oral decongestant such as Mucinex D or if you have glaucoma or high blood pressure use plain Mucinex. Saline nasal spray help and can safely be used as often as needed for congestion.  If you develop worsening sinus pain, fever or notice severe headache and vision changes, or if symptoms are not better after completion of antibiotic, please schedule an appointment with a health care provider.    Sinus infections are not as easily transmitted as other respiratory infection, however we still recommend that you avoid close contact with loved ones, especially the very young and elderly.  Remember to wash your hands thoroughly throughout the day as this is the number one way to prevent the spread of infection!  Home Care: Only take medications as instructed by your medical team. Complete the entire course of an antibiotic. Do not take these medications with alcohol. A steam or ultrasonic humidifier can help congestion.  You can place a towel over your head  and breathe in the steam from hot water coming from a faucet. Avoid close contacts especially the very young and the elderly. Cover your mouth when you cough or sneeze. Always remember to wash your hands.  Get Help Right Away If: You develop worsening fever or sinus pain. You develop a severe head ache or visual changes. Your symptoms persist after you have completed your treatment plan.  Make sure you Understand these instructions. Will watch your condition. Will get help right away if you are not doing well or get worse.  Thank you for choosing an e-visit.  Your e-visit answers were reviewed by a board certified advanced clinical practitioner to complete your personal care plan. Depending upon the condition, your plan could have included both over the counter or prescription medications.  Please review your pharmacy choice. Make sure the pharmacy is open so you can pick up prescription now. If there is a problem, you may contact your provider through [employer] and have the prescription routed to another pharmacy.  Your safety is important to [place]. If you have drug allergies check your prescription carefully.   For the next 24 hours you can use MyChart to ask questions about today's visit, request a non-urgent call back, or ask for a work or school excuse. You will get an email in the next two days asking about your experience.  I hope that your e-visit has been valuable and will speed your recovery.   I spent approximately 7 minutes reviewing the patient's history, current symptoms and coordinating their plan of care today.

## 2022-01-04 ENCOUNTER — Other Ambulatory Visit: Payer: Self-pay | Admitting: Family Medicine

## 2022-01-04 NOTE — Telephone Encounter (Signed)
Requested medication (s) are due for refill today: yes  Requested medication (s) are on the active medication list: yes  Last refill:  11/30/21  Future visit scheduled: 01/24/22  Notes to clinic:  This medication can not be delegated, please assess.    Requested Prescriptions  Pending Prescriptions Disp Refills   VYVANSE 50 MG capsule [Pharmacy Med Name: VYVANSE 50 MG CAP] 30 capsule     Sig: TAKE 1 CAPSULE BY MOUTH DAILY     Not Delegated - Psychiatry:  Stimulants/ADHD Failed - 01/04/2022  4:47 PM      Failed - This refill cannot be delegated      Failed - Urine Drug Screen completed in last 360 days      Passed - Valid encounter within last 3 months    Recent Outpatient Visits           1 month ago Adenopathy, cervical   [facility] Mins, [place], PA-C   5 months ago Rheumatoid arthritis, involving unspecified site, unspecified whether rheumatoid factor present [facility])   [facility] [place], Dionne Bucy, MD   7 months ago Chronic fatigue   [facility] [place], Dionne Bucy, MD   8 months ago Hypothyroidism, unspecified type   [place], Vickki Muff, PA-C   1 year ago Other [place], Clearnce Sorrel, [place]       Future Appointments             In 2 weeks Bacigalupo, Dionne Bucy, MD [facility], [place]

## 2022-01-07 ENCOUNTER — Telehealth: Payer: Medicaid Other | Admitting: Family

## 2022-01-07 DIAGNOSIS — J329 Chronic sinusitis, unspecified: Secondary | ICD-10-CM

## 2022-01-08 NOTE — Progress Notes (Signed)
Based on what you shared with me, I feel your condition warrants further evaluation and I recommend that you be seen in a face to face visit.  I am sorry, but after reviewing your chart you have received antibiotics 6 different times through an Evisit over the last year. At this time, I recommend you follow up with ENT. We have very strict guidelines on prescribing antibiotics through an Evisit.    NOTE: There will be NO CHARGE for this eVisit   If you are having a true medical emergency please call 911.      For an urgent face to face visit, [place] has six urgent care centers for your convenience:     [facility] [place] [facility] [place] Get Driving Directions [phone] [place], [place] [address]    [facility]) Get Driving Directions [phone] [place], [place] [address]  [facility] [place] [facility]) Get Driving Directions [phone] [address] [place],  [place]  [address]  [facility] Get Driving Directions [phone] [place], [place], [place] [address]   [facility] Get Driving Directions  [phone] [address] [place], [place] [address]   [facility] [place] Get Driving Directions [phone] [address], [place], [place] [address]  Your MyChart E-visit questionnaire answers were reviewed by a board certified advanced clinical practitioner to complete your personal care plan based on your specific symptoms.  Thank you for using e-Visits.

## 2022-01-19 ENCOUNTER — Telehealth: Payer: Self-pay | Admitting: Family Medicine

## 2022-01-19 NOTE — Telephone Encounter (Signed)
Requested Prescriptions  Pending Prescriptions Disp Refills   PROAIR HFA 108 (90 Base) MCG/ACT inhaler [Pharmacy Med Name: PROAIR HFA 108 (90 BASE) MCG/ACT IN] 8.5 g 0    Sig: INHALE 1 TO 2 PUFFS INTO THE LUNGS EVERY6 HOURS AS NEEDED FOR WHEEZING OR SHORTNESS OF BREATH     Pulmonology:  Beta Agonists 2 Passed - 01/19/2022  5:48 PM      Passed - Last BP in normal range    BP Readings from Last 1 Encounters:  11/17/21 104/79         Passed - Last Heart Rate in normal range    Pulse Readings from Last 1 Encounters:  11/17/21 (!) 103         Passed - Valid encounter within last 12 months    Recent Outpatient Visits          2 months ago Adenopathy, cervical   [employer], [place], PA-C   6 months ago Rheumatoid arthritis, involving unspecified site, unspecified whether rheumatoid factor present [facility])   [facility] [place], Dionne Bucy, MD   8 months ago Chronic fatigue   [facility] [place], Dionne Bucy, MD   8 months ago Hypothyroidism, unspecified type   [facility], Vickki Muff, PA-C   1 year ago Other [place], Clearnce Sorrel, [place]      Future Appointments            In 5 days Bacigalupo, Dionne Bucy, MD [facility], [facility]            Has appt next week

## 2022-01-20 MED ORDER — ALBUTEROL SULFATE HFA 108 (90 BASE) MCG/ACT IN AERS: 2.0000 | INHALATION_SPRAY | Freq: Four times a day (QID) | RESPIRATORY_TRACT | 2 refills | Status: DC | PRN

## 2022-01-20 NOTE — Addendum Note (Signed)
Addended by: Shawna Orleans on: 01/20/2022 04:07 PM   Modules accepted: Orders

## 2022-01-20 NOTE — Telephone Encounter (Signed)
Dawn at [facility] called saying the inhaler prescribed is no longer on the market.  Please call back with another inhaler.  Please put generic if she can use it.Mar[place] Kitchen

## 2022-01-24 ENCOUNTER — Encounter: Payer: Medicaid Other | Admitting: Family Medicine

## 2022-02-03 ENCOUNTER — Other Ambulatory Visit: Payer: Self-pay | Admitting: Family Medicine

## 2022-02-07 ENCOUNTER — Encounter: Payer: Self-pay | Admitting: Family Medicine

## 2022-02-08 ENCOUNTER — Telehealth: Payer: Medicaid Other | Admitting: Physician Assistant

## 2022-02-08 DIAGNOSIS — B9689 Other specified bacterial agents as the cause of diseases classified elsewhere: Secondary | ICD-10-CM | POA: Diagnosis not present

## 2022-02-08 DIAGNOSIS — B379 Candidiasis, unspecified: Secondary | ICD-10-CM

## 2022-02-08 DIAGNOSIS — J019 Acute sinusitis, unspecified: Secondary | ICD-10-CM

## 2022-02-08 DIAGNOSIS — T3695XA Adverse effect of unspecified systemic antibiotic, initial encounter: Secondary | ICD-10-CM | POA: Diagnosis not present

## 2022-02-09 MED ORDER — FLUCONAZOLE 150 MG PO TABS: 150.0000 mg | ORAL_TABLET | Freq: Once | ORAL | 0 refills | Status: AC

## 2022-02-09 MED ORDER — AZITHROMYCIN 250 MG PO TABS: ORAL_TABLET | ORAL | 0 refills | Status: AC

## 2022-02-09 NOTE — Progress Notes (Signed)
E-Visit for Sinus Problems ? ?We are sorry that you are not feeling well.  Here is how we plan to help! ? ?Based on what you have shared with me it looks like you have sinusitis.  Sinusitis is inflammation and infection in the sinus cavities of the head.  Based on your presentation I believe you most likely have Acute Bacterial Sinusitis.  This is an infection caused by bacteria and is treated with antibiotics. I have prescribed Azithromycin 250mg . Take two tablets on day 1, then 1 tablet daily until complete. Diflucan will be provided as well. You may use an oral decongestant such as Mucinex D or if you have glaucoma or high blood pressure use plain Mucinex. Saline nasal spray help and can safely be used as often as needed for congestion.  If you develop worsening sinus pain, fever or notice severe headache and vision changes, or if symptoms are not better after completion of antibiotic, please schedule an appointment with a health care provider.   ? ?Sinus infections are not as easily transmitted as other respiratory infection, however we still recommend that you avoid close contact with loved ones, especially the very young and elderly.  Remember to wash your hands thoroughly throughout the day as this is the number one way to prevent the spread of infection! ? ?Home Care: ?Only take medications as instructed by your medical team. ?Complete the entire course of an antibiotic. ?Do not take these medications with alcohol. ?A steam or ultrasonic humidifier can help congestion.  You can place a towel over your head and breathe in the steam from hot water coming from a faucet. ?Avoid close contacts especially the very young and the elderly. ?Cover your mouth when you cough or sneeze. ?Always remember to wash your hands. ? ?Get Help Right Away If: ?You develop worsening fever or sinus pain. ?You develop a severe head ache or visual changes. ?Your symptoms persist after you have completed your treatment plan. ? ?Make  sure you ?Understand these instructions. ?Will watch your condition. ?Will get help right away if you are not doing well or get worse. ? ?Thank you for choosing an e-visit. ? ?Your e-visit answers were reviewed by a board certified advanced clinical practitioner to complete your personal care plan. Depending upon the condition, your plan could have included both over the counter or prescription medications. ? ?Please review your pharmacy choice. Make sure the pharmacy is open so you can pick up prescription now. If there is a problem, you may contact your provider through [employer] and have the prescription routed to another pharmacy.  Your safety is important to [place]. If you have drug allergies check your prescription carefully.  ? ?For the next 24 hours you can use MyChart to ask questions about today's visit, request a non-urgent call back, or ask for a work or school excuse. ?You will get an email in the next two days asking about your experience. I hope that your e-visit has been valuable and will speed your recovery. ? ?I provided 5 minutes of non face-to-face time during this encounter for chart review and documentation.  ? ?

## 2022-02-15 DIAGNOSIS — F4312 Post-traumatic stress disorder, chronic: Secondary | ICD-10-CM | POA: Diagnosis not present

## 2022-02-15 DIAGNOSIS — F4323 Adjustment disorder with mixed anxiety and depressed mood: Secondary | ICD-10-CM | POA: Diagnosis not present

## 2022-02-15 DIAGNOSIS — F902 Attention-deficit hyperactivity disorder, combined type: Secondary | ICD-10-CM | POA: Diagnosis not present

## 2022-02-25 DIAGNOSIS — H35713 Central serous chorioretinopathy, bilateral: Secondary | ICD-10-CM | POA: Diagnosis not present

## 2022-03-02 ENCOUNTER — Other Ambulatory Visit: Payer: Self-pay | Admitting: Family Medicine

## 2022-03-03 NOTE — Telephone Encounter (Signed)
I think these are typically filled by her Dermatologist. If not, ok to fill though. ?

## 2022-03-08 DIAGNOSIS — Z1231 Encounter for screening mammogram for malignant neoplasm of breast: Secondary | ICD-10-CM | POA: Diagnosis not present

## 2022-03-08 DIAGNOSIS — L814 Other melanin hyperpigmentation: Secondary | ICD-10-CM | POA: Diagnosis not present

## 2022-03-08 DIAGNOSIS — L538 Other specified erythematous conditions: Secondary | ICD-10-CM | POA: Diagnosis not present

## 2022-03-08 DIAGNOSIS — L820 Inflamed seborrheic keratosis: Secondary | ICD-10-CM | POA: Diagnosis not present

## 2022-03-08 DIAGNOSIS — F424 Excoriation (skin-picking) disorder: Secondary | ICD-10-CM | POA: Diagnosis not present

## 2022-03-08 DIAGNOSIS — D2261 Melanocytic nevi of right upper limb, including shoulder: Secondary | ICD-10-CM | POA: Diagnosis not present

## 2022-03-08 DIAGNOSIS — Z7189 Other specified counseling: Secondary | ICD-10-CM | POA: Diagnosis not present

## 2022-03-08 DIAGNOSIS — L298 Other pruritus: Secondary | ICD-10-CM | POA: Diagnosis not present

## 2022-03-08 DIAGNOSIS — Z1239 Encounter for other screening for malignant neoplasm of breast: Secondary | ICD-10-CM | POA: Diagnosis not present

## 2022-03-08 DIAGNOSIS — L239 Allergic contact dermatitis, unspecified cause: Secondary | ICD-10-CM | POA: Diagnosis not present

## 2022-03-08 LAB — HM MAMMOGRAPHY

## 2022-03-14 ENCOUNTER — Other Ambulatory Visit: Payer: Self-pay | Admitting: Family Medicine

## 2022-03-18 DIAGNOSIS — E538 Deficiency of other specified B group vitamins: Secondary | ICD-10-CM | POA: Diagnosis not present

## 2022-03-24 DIAGNOSIS — J342 Deviated nasal septum: Secondary | ICD-10-CM | POA: Diagnosis not present

## 2022-03-24 DIAGNOSIS — J3489 Other specified disorders of nose and nasal sinuses: Secondary | ICD-10-CM | POA: Diagnosis not present

## 2022-03-24 DIAGNOSIS — J343 Hypertrophy of nasal turbinates: Secondary | ICD-10-CM | POA: Diagnosis not present

## 2022-03-31 ENCOUNTER — Encounter: Payer: Medicaid Other | Admitting: Family Medicine

## 2022-04-13 ENCOUNTER — Telehealth: Payer: Self-pay | Admitting: *Deleted

## 2022-04-13 DIAGNOSIS — Z09 Encounter for follow-up examination after completed treatment for conditions other than malignant neoplasm: Secondary | ICD-10-CM

## 2022-04-13 NOTE — Telephone Encounter (Signed)
Copied from [place] [id number]. Topic: Referral - Request for Referral ?>> Apr 13, 2022  4:50 PM Tessa Lerner A wrote: ?Has patient seen PCP for this complaint? No. ?*If NO, is insurance requiring patient see PCP for this issue before PCP can refer them? ?Referral for which specialty: [facility] Ot[facility]  ?Preferred provider/office: Erasmo Leventhal - fax [phone] ?Reason for referral: Post surgical follow up ?

## 2022-04-14 NOTE — Telephone Encounter (Signed)
Patient advised that referral has been placed. She reports Dr. Ellin Mayhew is at [facility] and not [facility] as [facility] documented. Referral coordinator advised.  ?

## 2022-04-14 NOTE — Telephone Encounter (Signed)
Ok to place new referral. She has been followed by them for chronic sinusitis and surgery for a long time. ?

## 2022-04-25 ENCOUNTER — Other Ambulatory Visit: Payer: Self-pay | Admitting: Family Medicine

## 2022-04-26 DIAGNOSIS — L249 Irritant contact dermatitis, unspecified cause: Secondary | ICD-10-CM | POA: Diagnosis not present

## 2022-04-26 DIAGNOSIS — L304 Erythema intertrigo: Secondary | ICD-10-CM | POA: Diagnosis not present

## 2022-04-27 DIAGNOSIS — M25562 Pain in left knee: Secondary | ICD-10-CM | POA: Diagnosis not present

## 2022-05-10 DIAGNOSIS — F4312 Post-traumatic stress disorder, chronic: Secondary | ICD-10-CM | POA: Diagnosis not present

## 2022-05-10 DIAGNOSIS — F4323 Adjustment disorder with mixed anxiety and depressed mood: Secondary | ICD-10-CM | POA: Diagnosis not present

## 2022-05-10 DIAGNOSIS — F902 Attention-deficit hyperactivity disorder, combined type: Secondary | ICD-10-CM | POA: Diagnosis not present

## 2022-05-10 NOTE — Progress Notes (Deleted)
Complete physical exam   Patient: [name]   DOB: [birth year]   46 y.o. Female  MRN: [id number] Visit Date: 05/12/2022  Today's healthcare provider: Lavon Paganini, MD   No chief complaint on file.  Subjective    [name] is a 46 y.o. female who presents today for a complete physical exam.  She reports consuming a {diet types:17450} diet. {Exercise:19826} She generally feels {well/fairly well/poorly:18703}. She reports sleeping {well/fairly well/poorly:18703}. She {does/does not:200015} have additional problems to discuss today.  HPI  ***  Past Medical History:  Diagnosis Date   ADD (attention deficit disorder)    ADHD    Bone spur    "front of spine" - effects right shoulder (sometimes)   Central serous chorioretinopathy, right eye    Depression    Deviated nasal septum    Dysplastic nevus 08/21/2019   Right lower abdomen. Moderate atypia, margins free.   History of sinus surgery 09/28/2020   Hypothyroidism    Nondisplaced fracture of fifth left metatarsal bone    PTSD (post-traumatic stress disorder)    RA (rheumatoid arthritis) ([place]) 2016   positive ANA   Rheumatoid arthritis ([place]) 12/09/2016   Sinusitis    Past Surgical History:  Procedure Laterality Date   ARTHROPLASTY  10/02/2020   C5-6 C6-7    BACK SURGERY     CESAREAN SECTION     DILATION AND CURETTAGE OF UTERUS     (x2)   FRONTAL SINUS EXPLORATION Bilateral 11/19/2015   Procedure: FRONTAL SINUS EXPLORATION;  Surgeon: Margaretha Sheffield, MD;  Location: [place];  Service: ENT;  Laterality: Bilateral;   IMAGE GUIDED SINUS SURGERY N/A 11/19/2015   Procedure: IMAGE GUIDED SINUS SURGERY;  Surgeon: Margaretha Sheffield, MD;  Location: [place];  Service: ENT;  Laterality: N/A;  GAVE DISK TO CE CE 09/22   MAXILLARY ANTROSTOMY Bilateral 11/19/2015   Procedure: MAXILLARY ANTROSTOMY;  Surgeon: Margaretha Sheffield, MD;  Location: [place];  Service: ENT;  Laterality: Bilateral;    SEPTOPLASTY N/A 11/19/2015   Procedure: SEPTOPLASTY;  Surgeon: Margaretha Sheffield, MD;  Location: [place];  Service: ENT;  Laterality: N/A;   SPHENOIDECTOMY Bilateral 11/19/2015   Procedure: Coralee Pesa;  Surgeon: Margaretha Sheffield, MD;  Location: [place];  Service: ENT;  Laterality: Bilateral;   TONSILLECTOMY     Social History   Socioeconomic History   Marital status: Divorced    Spouse name: Not on file   Number of children: 2   Years of education: Not on file   Highest education level: Not on file  Occupational History    Employer: [employer]   Tobacco Use   Smoking status: Never   Smokeless tobacco: Never  Vaping Use   Vaping Use: Never used  Substance and Sexual Activity   Alcohol use: Yes    Alcohol/week: 0.0 standard drinks    Comment: rare- wine    Drug use: Never   Sexual activity: Yes    Birth control/protection: Pill  Other Topics Concern   Not on file  Social History Narrative   Not on file   Social Determinants of Health   Financial Resource Strain: Not on file  Food Insecurity: Not on file  Transportation Needs: Not on file  Physical Activity: Not on file  Stress: Not on file  Social Connections: Not on file  Intimate Partner Violence: Not on file   Family Status  Relation Name Status   Mother  Deceased  PGF  (Not Specified)   Cousin  (Not Specified)   Father  Alive   Brother  Alive   Son  Alive   Daughter  Alive       legally blind in left eye    Neg Hx  (Not Specified)   Family History  Problem Relation Age of Onset   Rheum arthritis Mother    Cancer Paternal Grandfather    Cancer Cousin    Vascular Disease Father    Alcohol abuse Brother        history of    Drug abuse Brother        history of    Breast cancer Neg Hx    Allergies  Allergen Reactions   Penicillins Swelling    Pt states caused throat to swell    Sulfa Antibiotics Other (See Comments)    Lips tingled    Patient Care  Team: Virginia Crews, MD as PCP - General (Family Medicine)   Medications: Outpatient Medications Prior to Visit  Medication Sig   albuterol (ACCUNEB) 0.63 MG/3ML nebulizer solution Take 1 ampule by nebulization every 6 (six) hours as needed for wheezing.   albuterol (VENTOLIN HFA) 108 (90 Base) MCG/ACT inhaler Inhale 2 puffs into the lungs every 6 (six) hours as needed for wheezing or shortness of breath.   buPROPion (WELLBUTRIN) 75 MG tablet TAKE 2 TABLETS BY MOUTH EVERY MORNING AND 1 TABLET EVERY DAY AFTER LUNCH   ELIDEL 1 % cream Apply topically 2 (two) times daily.   eplerenone (INSPRA) 25 MG tablet Take 1 tablet by mouth 2 (two) times daily.   estradiol (ESTRACE) 1 MG tablet Take 1 mg by mouth daily.   folic acid (FOLVITE) 1 MG tablet Take 1 mg by mouth daily.   furosemide (LASIX) 20 MG tablet TAKE 1 TABLET BY MOUTH DAILY AS NEEDED FOR EDEMA.   LINZESS 145 MCG CAPS capsule Take 145 mcg by mouth daily.   lisdexamfetamine (VYVANSE) 50 MG capsule Take 1 capsule (50 mg total) by mouth daily.   lisdexamfetamine (VYVANSE) 50 MG capsule Take 1 capsule (50 mg total) by mouth daily.   meloxicam (MOBIC) 15 MG tablet TAKE 1 TABLET BY MOUTH EVERY DAY WITH THE LARGEST MEAL   methocarbamol (ROBAXIN) 500 MG tablet Take by mouth as needed.   methotrexate (RHEUMATREX) 2.5 MG tablet 15 mg once a week. Every Monday 5 tablets a day   mometasone (ELOCON) 0.1 % cream Apply topically.   naratriptan (AMERGE) 2.5 MG tablet Take 1 tablet (2.5 mg total) by mouth as needed for migraine. Take one (1) tablet at onset of headache; if returns or does not resolve, may repeat after 4 hours; do not exceed five (5) mg in 24 hours.   norethindrone-ethinyl estradiol-iron (LOESTRIN FE) 1.5-30 MG-MCG tablet Take 1 tablet by mouth daily.   SUMAtriptan (IMITREX) 100 MG tablet TK 1 T PO AOS OF HEADACHE. REPEAT IN 2 H AS NEEDED. MAX DOSE 2 T IN 24 H   terbinafine (LAMISIL) 250 MG tablet Take 1 tablet (250 mg total) by mouth  daily.   traZODone (DESYREL) 150 MG tablet Take 0.5-1 tablets (75-150 mg total) by mouth at bedtime as needed for sleep. (Patient taking differently: Take 75-150 mg by mouth daily.)   VYVANSE 50 MG capsule TAKE 1 CAPSULE BY MOUTH DAILY   No facility-administered medications prior to visit.    Review of Systems  {Labs  Heme  Chem  Endocrine  Serology  Results Review (optional):23779}  Objective    There were no vitals taken for this visit. {Show previous vital signs (optional):23777}   Physical Exam  ***  Last depression screening scores    11/17/2021    9:51 AM 05/11/2021    9:41 AM 05/04/2021   11:36 AM  PHQ 2/9 Scores  PHQ - 2 Score '2 2 3  '$ PHQ- 9 Score '13 10 10   '$ Last fall risk screening    11/17/2021    9:52 AM  Brady in the past year? 0  Number falls in past yr: 0  Injury with Fall? 0  Risk for fall due to : No Fall Risks  Follow up Falls evaluation completed   Last Audit-C alcohol use screening    11/17/2021    9:52 AM  Alcohol Use Disorder Test (AUDIT)  1. How often do you have a drink containing alcohol? 3  2. How many drinks containing alcohol do you have on a typical day when you are drinking? 0  3. How often do you have six or more drinks on one occasion? 0  AUDIT-C Score 3   A score of 3 or more in women, and 4 or more in men indicates increased risk for alcohol abuse, EXCEPT if all of the points are from question 1   No results found for any visits on 05/12/22.  Assessment & Plan    Routine Health Maintenance and Physical Exam  Exercise Activities and Dietary recommendations  Goals   None     Immunization History  Administered Date(s) Administered   Hepatitis A, Adult 10/26/2018   Moderna Sars-Covid-2 Vaccination 06/26/2020   Tdap 05/11/2021    Health Maintenance  Topic Date Due   COLONOSCOPY (Pts 45-30yr Insurance coverage will need to be confirmed)  Never done   COVID-19 Vaccine (2 - Moderna risk series) 07/24/2020    INFLUENZA VACCINE  07/12/2022   PAP SMEAR-Modifier  12/22/2022   TETANUS/TDAP  05/12/2031   Hepatitis C Screening  Completed   HIV Screening  Completed   HPV VACCINES  Aged Out    Discussed health benefits of physical activity, and encouraged her to engage in regular exercise appropriate for her age and condition.  ***  No follow-ups on file.     {provider attestation***:1}   ALavon Paganini MD  [phone](phone) [phone](fax)  [place]

## 2022-05-11 DIAGNOSIS — R2990 Unspecified symptoms and signs involving the nervous system: Secondary | ICD-10-CM | POA: Diagnosis not present

## 2022-05-11 DIAGNOSIS — R251 Tremor, unspecified: Secondary | ICD-10-CM | POA: Diagnosis not present

## 2022-05-11 DIAGNOSIS — G959 Disease of spinal cord, unspecified: Secondary | ICD-10-CM | POA: Diagnosis not present

## 2022-05-11 DIAGNOSIS — Z862 Personal history of diseases of the blood and blood-forming organs and certain disorders involving the immune mechanism: Secondary | ICD-10-CM | POA: Diagnosis not present

## 2022-05-11 DIAGNOSIS — E569 Vitamin deficiency, unspecified: Secondary | ICD-10-CM | POA: Diagnosis not present

## 2022-05-11 DIAGNOSIS — G43109 Migraine with aura, not intractable, without status migrainosus: Secondary | ICD-10-CM | POA: Diagnosis not present

## 2022-05-12 ENCOUNTER — Encounter: Payer: Self-pay | Admitting: *Deleted

## 2022-05-12 ENCOUNTER — Encounter: Payer: Self-pay | Admitting: Family Medicine

## 2022-05-12 ENCOUNTER — Encounter: Payer: Medicaid Other | Admitting: Family Medicine

## 2022-05-12 ENCOUNTER — Ambulatory Visit: Payer: Medicaid Other | Admitting: Family Medicine

## 2022-05-12 VITALS — BP 125/81 | HR 94 | Resp 14 | Wt 136.0 lb

## 2022-05-12 DIAGNOSIS — J320 Chronic maxillary sinusitis: Secondary | ICD-10-CM | POA: Insufficient documentation

## 2022-05-12 DIAGNOSIS — M069 Rheumatoid arthritis, unspecified: Secondary | ICD-10-CM | POA: Diagnosis not present

## 2022-05-12 DIAGNOSIS — Z0000 Encounter for general adult medical examination without abnormal findings: Secondary | ICD-10-CM | POA: Diagnosis not present

## 2022-05-12 DIAGNOSIS — J342 Deviated nasal septum: Secondary | ICD-10-CM

## 2022-05-12 DIAGNOSIS — F331 Major depressive disorder, recurrent, moderate: Secondary | ICD-10-CM

## 2022-05-12 DIAGNOSIS — Z1211 Encounter for screening for malignant neoplasm of colon: Secondary | ICD-10-CM

## 2022-05-12 DIAGNOSIS — F411 Generalized anxiety disorder: Secondary | ICD-10-CM

## 2022-05-12 DIAGNOSIS — E039 Hypothyroidism, unspecified: Secondary | ICD-10-CM | POA: Diagnosis not present

## 2022-05-12 DIAGNOSIS — Z862 Personal history of diseases of the blood and blood-forming organs and certain disorders involving the immune mechanism: Secondary | ICD-10-CM | POA: Insufficient documentation

## 2022-05-12 MED ORDER — FUROSEMIDE 20 MG PO TABS
ORAL_TABLET | ORAL | 1 refills | Status: DC
Start: 2022-05-12 — End: 2023-02-24

## 2022-05-12 MED ORDER — FLUCONAZOLE 150 MG PO TABS: 150.0000 mg | ORAL_TABLET | Freq: Once | ORAL | 0 refills | Status: AC

## 2022-05-12 MED ORDER — CEPHALEXIN 500 MG PO CAPS: 500.0000 mg | ORAL_CAPSULE | Freq: Two times a day (BID) | ORAL | 0 refills | Status: AC

## 2022-05-12 NOTE — Assessment & Plan Note (Signed)
Wants second opinion from [facility] She did not tolerate MTX Referral placed today

## 2022-05-12 NOTE — Assessment & Plan Note (Signed)
F/b psychiatry No changes to meds

## 2022-05-12 NOTE — Progress Notes (Signed)
Complete physical exam  I,April Miller,acting as a scribe for Lavon Paganini, MD.,have documented all relevant documentation on the behalf of Lavon Paganini, MD,as directed by  Lavon Paganini, MD while in the presence of Lavon Paganini, MD.   Patient: [name]   DOB: 06/16/1976   46 y.o. Female  MRN: [id number] Visit Date: 05/12/2022  Today's healthcare provider: Lavon Paganini, MD   Chief Complaint  Patient presents with   Annual Exam   Subjective    [name] is a 46 y.o. female who presents today for a complete physical exam.  She reports consuming a general diet.  Exercises regularly.  She generally feels well. She reports sleeping fairly well. She does have additional problems to discuss today.  HPI   Had sinus surgery 6 weeks ago at [facility].  She has had sinus infection since and ear still hurts. She took abx and felt it was not effective.  Still doing NeilMed saline wash.  She wants another opinion from [facility] - already set up with Dr Ellin Mayhew next week.    Past Medical History:  Diagnosis Date   ADD (attention deficit disorder)    ADHD    Bone spur    "front of spine" - effects right shoulder (sometimes)   Central serous chorioretinopathy, right eye    Depression    Deviated nasal septum    Dysplastic nevus 08/21/2019   Right lower abdomen. Moderate atypia, margins free.   History of sinus surgery 09/28/2020   Hypothyroidism    Nondisplaced fracture of fifth left metatarsal bone    PTSD (post-traumatic stress disorder)    RA (rheumatoid arthritis) ([place]) 2016   positive ANA   Rheumatoid arthritis ([place]) 12/09/2016   Sinusitis    Past Surgical History:  Procedure Laterality Date   ARTHROPLASTY  10/02/2020   C5-6 C6-7    BACK SURGERY     CESAREAN SECTION     DILATION AND CURETTAGE OF UTERUS     (x2)   FRONTAL SINUS EXPLORATION Bilateral 11/19/2015   Procedure: FRONTAL SINUS EXPLORATION;  Surgeon: Margaretha Sheffield, MD;  Location:  [place];  Service: ENT;  Laterality: Bilateral;   IMAGE GUIDED SINUS SURGERY N/A 11/19/2015   Procedure: IMAGE GUIDED SINUS SURGERY;  Surgeon: Margaretha Sheffield, MD;  Location: [place];  Service: ENT;  Laterality: N/A;  GAVE DISK TO CE CE 09/22   MAXILLARY ANTROSTOMY Bilateral 11/19/2015   Procedure: MAXILLARY ANTROSTOMY;  Surgeon: Margaretha Sheffield, MD;  Location: [place];  Service: ENT;  Laterality: Bilateral;   SEPTOPLASTY N/A 11/19/2015   Procedure: SEPTOPLASTY;  Surgeon: Margaretha Sheffield, MD;  Location: [place];  Service: ENT;  Laterality: N/A;   SPHENOIDECTOMY Bilateral 11/19/2015   Procedure: Coralee Pesa;  Surgeon: Margaretha Sheffield, MD;  Location: [place];  Service: ENT;  Laterality: Bilateral;   TONSILLECTOMY     Social History   Socioeconomic History   Marital status: Divorced    Spouse name: Not on file   Number of children: 2   Years of education: Not on file   Highest education level: Not on file  Occupational History    Employer: [employer]   Tobacco Use   Smoking status: Never   Smokeless tobacco: Never  Vaping Use   Vaping Use: Never used  Substance and Sexual Activity   Alcohol use: Yes    Alcohol/week: 0.0 standard drinks    Comment: rare- wine    Drug  use: Never   Sexual activity: Yes    Birth control/protection: Pill  Other Topics Concern   Not on file  Social History Narrative   Not on file   Social Determinants of Health   Financial Resource Strain: Not on file  Food Insecurity: Not on file  Transportation Needs: Not on file  Physical Activity: Not on file  Stress: Not on file  Social Connections: Not on file  Intimate Partner Violence: Not on file   Family Status  Relation Name Status   Mother  Deceased   PGF  (Not Specified)   Cousin  (Not Specified)   Father  Alive   Brother  Alive   Son  Alive   Daughter  Alive       legally blind in left eye    Neg Hx  (Not Specified)    Family History  Problem Relation Age of Onset   Rheum arthritis Mother    Cancer Paternal Grandfather    Cancer Cousin    Vascular Disease Father    Alcohol abuse Brother        history of    Drug abuse Brother        history of    Breast cancer Neg Hx    Allergies  Allergen Reactions   Penicillins Swelling    Pt states caused throat to swell    Sulfa Antibiotics Other (See Comments)    Lips tingled    Patient Care Team: Virginia Crews, MD as PCP - General (Family Medicine)   Medications: Outpatient Medications Prior to Visit  Medication Sig   albuterol (ACCUNEB) 0.63 MG/3ML nebulizer solution Take 1 ampule by nebulization every 6 (six) hours as needed for wheezing.   albuterol (VENTOLIN HFA) 108 (90 Base) MCG/ACT inhaler Inhale 2 puffs into the lungs every 6 (six) hours as needed for wheezing or shortness of breath.   buPROPion (WELLBUTRIN XL) 300 MG 24 hr tablet Take by mouth.   clonazePAM (KLONOPIN) 0.5 MG tablet Take 0.5 mg by mouth daily as needed.   cyanocobalamin (,VITAMIN B-12,) 1000 MCG/ML injection cyanocobalamin (vit B-12) 1,000 mcg/mL injection solution   ELIDEL 1 % cream Apply topically 2 (two) times daily.   estradiol (ESTRACE) 1 MG tablet Take 1 mg by mouth daily.   LINZESS 145 MCG CAPS capsule Take 145 mcg by mouth daily.   lisdexamfetamine (VYVANSE) 50 MG capsule Take 1 capsule (50 mg total) by mouth daily.   meloxicam (MOBIC) 15 MG tablet TAKE 1 TABLET BY MOUTH EVERY DAY WITH THE LARGEST MEAL   methocarbamol (ROBAXIN) 500 MG tablet Take by mouth as needed.   methotrexate (RHEUMATREX) 2.5 MG tablet 15 mg once a week. Every Monday 5 tablets a day   mometasone (ELOCON) 0.1 % cream Apply topically.   naratriptan (AMERGE) 2.5 MG tablet Take 1 tablet (2.5 mg total) by mouth as needed for migraine. Take one (1) tablet at onset of headache; if returns or does not resolve, may repeat after 4 hours; do not exceed five (5) mg in 24 hours.    norethindrone-ethinyl estradiol-iron (LOESTRIN FE) 1.5-30 MG-MCG tablet Take 1 tablet by mouth daily.   SUMAtriptan (IMITREX) 100 MG tablet TK 1 T PO AOS OF HEADACHE. REPEAT IN 2 H AS NEEDED. MAX DOSE 2 T IN 24 H   terbinafine (LAMISIL) 250 MG tablet Take 1 tablet (250 mg total) by mouth daily.   [DISCONTINUED] furosemide (LASIX) 20 MG tablet TAKE 1 TABLET BY MOUTH DAILY AS NEEDED  FOR EDEMA.   [DISCONTINUED] lisdexamfetamine (VYVANSE) 50 MG capsule Take 1 capsule (50 mg total) by mouth daily.   [DISCONTINUED] VYVANSE 50 MG capsule TAKE 1 CAPSULE BY MOUTH DAILY   eplerenone (INSPRA) 25 MG tablet Take 1 tablet by mouth 2 (two) times daily.   [DISCONTINUED] buPROPion (WELLBUTRIN) 75 MG tablet TAKE 2 TABLETS BY MOUTH EVERY MORNING AND 1 TABLET EVERY DAY AFTER LUNCH (Patient not taking: Reported on 05/12/2022)   [DISCONTINUED] folic acid (FOLVITE) 1 MG tablet Take 1 mg by mouth daily. (Patient not taking: Reported on 05/12/2022)   [DISCONTINUED] traZODone (DESYREL) 150 MG tablet Take 0.5-1 tablets (75-150 mg total) by mouth at bedtime as needed for sleep. (Patient taking differently: Take 75-150 mg by mouth daily.)   No facility-administered medications prior to visit.    Review of Systems  Constitutional:  Positive for activity change, diaphoresis and fatigue.  HENT:  Positive for congestion, facial swelling, hearing loss, postnasal drip, rhinorrhea, sinus pressure, sneezing, tinnitus and trouble swallowing.   Eyes:  Positive for photophobia and visual disturbance.  Respiratory:  Positive for choking.   Cardiovascular:  Positive for palpitations and leg swelling.  Gastrointestinal:  Positive for abdominal distention and constipation.  Endocrine: Positive for cold intolerance, heat intolerance and polyuria.  Musculoskeletal:  Positive for arthralgias, back pain, joint swelling and myalgias.  Skin:  Positive for pallor and rash.  Allergic/Immunologic: Positive for environmental allergies.   Neurological:  Positive for dizziness, tremors, weakness, light-headedness and headaches.  Psychiatric/Behavioral:  Positive for agitation, decreased concentration, dysphoric mood and sleep disturbance. The patient is nervous/anxious and is hyperactive.   All other systems reviewed and are negative.  Last CBC Lab Results  Component Value Date   WBC 5.4 05/04/2021   HGB 13.3 05/04/2021   HCT 39.9 05/04/2021   MCV 99 (H) 05/04/2021   MCH 32.9 05/04/2021   RDW 10.9 (L) 05/04/2021   PLT 270 28/76/8115   Last metabolic panel Lab Results  Component Value Date   GLUCOSE 88 05/04/2021   NA 134 05/04/2021   K 4.5 05/04/2021   CL 97 05/04/2021   CO2 21 05/04/2021   BUN 16 05/04/2021   CREATININE 1.10 (H) 05/04/2021   EGFR 63 05/04/2021   CALCIUM 9.2 05/04/2021   PHOS 2.9 02/19/2016   PROT 6.5 05/04/2021   ALBUMIN 4.6 05/04/2021   LABGLOB 1.9 05/04/2021   AGRATIO 2.4 (H) 05/04/2021   BILITOT 0.5 05/04/2021   ALKPHOS 51 05/04/2021   AST 16 05/04/2021   ALT 13 05/04/2021   ANIONGAP 9 06/13/2017   Last lipids Lab Results  Component Value Date   CHOL 234 (H) 11/17/2020   HDL 72 11/17/2020   LDLCALC 146 (H) 11/17/2020   TRIG 94 11/17/2020   CHOLHDL 3.3 11/17/2020   Last thyroid functions Lab Results  Component Value Date   TSH 2.630 05/04/2021   T4TOTAL 8.2 05/04/2021   Last vitamin D Lab Results  Component Value Date   VD25OH 62.8 05/04/2021   Last vitamin B12 and Folate Lab Results  Component Value Date   VITAMINB12 336 05/04/2021   FOLATE >20.0 05/18/2017      Objective     BP 125/81   Pulse 94   Resp 14   Wt 136 lb (61.7 kg)   BMI 21.30 kg/m  BP Readings from Last 3 Encounters:  05/12/22 125/81  11/17/21 104/79  07/20/21 117/84   Wt Readings from Last 3 Encounters:  05/12/22 136 lb (61.7 kg)  11/17/21 138  lb (62.6 kg)  07/20/21 143 lb 3.2 oz (65 kg)       Physical Exam Vitals reviewed.  Constitutional:      General: She is not in acute  distress.    Appearance: Normal appearance. She is well-developed. She is not diaphoretic.  HENT:     Head: Normocephalic and atraumatic.     Right Ear: Tympanic membrane, ear canal and external ear normal.     Left Ear: Tympanic membrane, ear canal and external ear normal.     Nose: Nose normal.     Mouth/Throat:     Mouth: Mucous membranes are moist.     Pharynx: Oropharynx is clear. No oropharyngeal exudate.  Eyes:     General: No scleral icterus.    Conjunctiva/sclera: Conjunctivae normal.     Pupils: Pupils are equal, round, and reactive to light.  Neck:     Thyroid: No thyromegaly.  Cardiovascular:     Rate and Rhythm: Normal rate and regular rhythm.     Pulses: Normal pulses.     Heart sounds: Normal heart sounds. No murmur heard. Pulmonary:     Effort: Pulmonary effort is normal. No respiratory distress.     Breath sounds: Normal breath sounds. No wheezing or rales.  Abdominal:     General: There is no distension.     Palpations: Abdomen is soft.     Tenderness: There is no abdominal tenderness.  Musculoskeletal:        General: No deformity.     Cervical back: Neck supple.     Right lower leg: No edema.     Left lower leg: No edema.  Lymphadenopathy:     Cervical: No cervical adenopathy.  Skin:    General: Skin is warm and dry.     Findings: No rash.  Neurological:     Mental Status: She is alert and oriented to person, place, and time. Mental status is at baseline.     Sensory: No sensory deficit.     Motor: No weakness.     Gait: Gait normal.  Psychiatric:        Mood and Affect: Mood normal.        Behavior: Behavior normal.        Thought Content: Thought content normal.      Last depression screening scores    05/12/2022    3:05 PM 11/17/2021    9:51 AM 05/11/2021    9:41 AM  PHQ 2/9 Scores  PHQ - 2 Score '3 2 2  ' PHQ- 9 Score '11 13 10   ' Last fall risk screening    05/12/2022    3:04 PM  Liberal in the past year? 0  Number falls in past  yr: 0  Injury with Fall? 0  Risk for fall due to : History of fall(s)  Follow up Falls evaluation completed   Last Audit-C alcohol use screening    05/12/2022    3:04 PM  Alcohol Use Disorder Test (AUDIT)  1. How often do you have a drink containing alcohol? 4  2. How many drinks containing alcohol do you have on a typical day when you are drinking? 0  3. How often do you have six or more drinks on one occasion? 1  AUDIT-C Score 5  4. How often during the last year have you found that you were not able to stop drinking once you had started? 0  5. How often during the last year  have you failed to do what was normally expected from you because of drinking? 0  6. How often during the last year have you needed a first drink in the morning to get yourself going after a heavy drinking session? 0  7. How often during the last year have you had a feeling of guilt of remorse after drinking? 0  8. How often during the last year have you been unable to remember what happened the night before because you had been drinking? 0  9. Have you or someone else been injured as a result of your drinking? 0  10. Has a relative or friend or a doctor or another health worker been concerned about your drinking or suggested you cut down? 0  Alcohol Use Disorder Identification Test Final Score (AUDIT) 5   A score of 3 or more in women, and 4 or more in men indicates increased risk for alcohol abuse, EXCEPT if all of the points are from question 1   Results for orders placed or performed in visit on 05/12/22  HM MAMMOGRAPHY  Result Value Ref Range   HM Mammogram 0-4 Bi-Rad 0-4 Bi-Rad, Self Reported Normal    Assessment & Plan    Routine Health Maintenance and Physical Exam  Exercise Activities and Dietary recommendations  Goals   None     Immunization History  Administered Date(s) Administered   Hepatitis A, Adult 10/26/2018   Moderna Sars-Covid-2 Vaccination 06/26/2020   Tdap 05/11/2021    Health  Maintenance  Topic Date Due   COLONOSCOPY (Pts 45-49yr Insurance coverage will need to be confirmed)  Never done   COVID-19 Vaccine (2 - Moderna risk series) 07/24/2020   INFLUENZA VACCINE  07/12/2022   PAP SMEAR-Modifier  12/22/2022   MAMMOGRAM  03/09/2023   TETANUS/TDAP  05/12/2031   Hepatitis C Screening  Completed   HIV Screening  Completed   HPV VACCINES  Aged Out    Discussed health benefits of physical activity, and encouraged her to engage in regular exercise appropriate for her age and condition.  Problem List Items Addressed This Visit       Respiratory   Deviated nasal septum   Chronic maxillary sinusitis    Status post sinus surgery and treated for postop infection, but still having sinus tenderness, green nasal discharge We will treat with Keflex as she has tolerated this well previously and she does not tolerate Bactrim, Doxy and azithromycin does not seem helpful She has upcoming follow-up with ENT for further evaluation and management       Relevant Medications   cephALEXin (KEFLEX) 500 MG capsule   fluconazole (DIFLUCAN) 150 MG tablet     Endocrine   Hypothyroidism    Previously well controlled Off of synthroid for more than 1 year Recheck TSH no need for synthroid at this time       Relevant Orders   TSH     Musculoskeletal and Integument   [place]    [place] [facility] She did not tolerate MTX Referral placed today       Relevant Orders   Ambulatory referral to Rheumatology     Other   GAD (generalized anxiety disorder)    F/b psychiatry No changes to meds       Relevant Medications   buPROPion (WELLBUTRIN XL) 300 MG 24 hr tablet   Moderate episode of recurrent major depressive disorder ([facility] [place]    F/b psychiatry No changes to meds  Relevant Medications   buPROPion (WELLBUTRIN XL) 300 MG 24 hr tablet   Other Visit Diagnoses     Encounter for annual physical exam    -  Primary    Relevant Orders   Comprehensive metabolic panel   Lipid Panel With LDL/HDL Ratio   TSH   CBC   Colon cancer screening       Relevant Orders   Ambulatory referral to Gastroenterology        Return in about 1 year (around 05/13/2023) for CPE.     I, Lavon Paganini, MD, have reviewed all documentation for this visit. The documentation on 05/12/22 for the exam, diagnosis, procedures, and orders are all accurate and complete.   , Dionne Bucy, MD, MPH [facility]

## 2022-05-12 NOTE — Assessment & Plan Note (Signed)
Status post sinus surgery and treated for postop infection, but still having sinus tenderness, green nasal discharge We will treat with Keflex as she has tolerated this well previously and she does not tolerate Bactrim, Doxy and azithromycin does not seem helpful She has upcoming follow-up with ENT for further evaluation and management

## 2022-05-12 NOTE — Assessment & Plan Note (Signed)
Previously well controlled Off of synthroid for more than 1 year Recheck TSH no need for synthroid at this time

## 2022-05-13 LAB — COMPREHENSIVE METABOLIC PANEL
ALT: 13 IU/L (ref 0–32)
AST: 22 IU/L (ref 0–40)
Albumin/Globulin Ratio: 2 (ref 1.2–2.2)
Albumin: 4.4 g/dL (ref 3.8–4.8)
Alkaline Phosphatase: 48 IU/L (ref 44–121)
BUN/Creatinine Ratio: 14 (ref 9–23)
BUN: 14 mg/dL (ref 6–24)
Bilirubin Total: 0.5 mg/dL (ref 0.0–1.2)
CO2: 24 mmol/L (ref 20–29)
Calcium: 9.2 mg/dL (ref 8.7–10.2)
Chloride: 101 mmol/L (ref 96–106)
Creatinine, Ser: 0.99 mg/dL (ref 0.57–1.00)
Globulin, Total: 2.2 g/dL (ref 1.5–4.5)
Glucose: 85 mg/dL (ref 70–99)
Potassium: 4.3 mmol/L (ref 3.5–5.2)
Sodium: 136 mmol/L (ref 134–144)
Total Protein: 6.6 g/dL (ref 6.0–8.5)
eGFR: 71 mL/min/{1.73_m2} (ref >59)

## 2022-05-13 LAB — CBC
Hematocrit: 36.9 % (ref 34.0–46.6)
Hemoglobin: 12.7 g/dL (ref 11.1–15.9)
MCH: 33.9 pg — ABNORMAL HIGH (ref 26.6–33.0)
MCHC: 34.4 g/dL (ref 31.5–35.7)
MCV: 98 fL — ABNORMAL HIGH (ref 79–97)
Platelets: 295 10*3/uL (ref 150–450)
RBC: 3.75 x10E6/uL — ABNORMAL LOW (ref 3.77–5.28)
RDW: 11.3 % — ABNORMAL LOW (ref 11.7–15.4)
WBC: 5.5 10*3/uL (ref 3.4–10.8)

## 2022-05-13 LAB — LIPID PANEL WITH LDL/HDL RATIO
Cholesterol, Total: 204 mg/dL — ABNORMAL HIGH (ref 100–199)
HDL: 86 mg/dL (ref >39)
LDL Chol Calc (NIH): 102 mg/dL — ABNORMAL HIGH (ref 0–99)
LDL/HDL Ratio: 1.2 ratio (ref 0.0–3.2)
Triglycerides: 94 mg/dL (ref 0–149)
VLDL Cholesterol Cal: 16 mg/dL (ref 5–40)

## 2022-05-13 LAB — TSH: TSH: 1.85 u[IU]/mL (ref 0.450–4.500)

## 2022-05-24 ENCOUNTER — Encounter: Payer: Self-pay | Admitting: Family Medicine

## 2022-05-24 NOTE — Telephone Encounter (Signed)
[name] aware. [facility] will fax over a form to be filled out.

## 2022-05-25 DIAGNOSIS — J3489 Other specified disorders of nose and nasal sinuses: Secondary | ICD-10-CM | POA: Diagnosis not present

## 2022-05-25 DIAGNOSIS — J328 Other chronic sinusitis: Secondary | ICD-10-CM | POA: Diagnosis not present

## 2022-05-31 DIAGNOSIS — M25511 Pain in right shoulder: Secondary | ICD-10-CM | POA: Diagnosis not present

## 2022-06-08 DIAGNOSIS — Z01419 Encounter for gynecological examination (general) (routine) without abnormal findings: Secondary | ICD-10-CM | POA: Diagnosis not present

## 2022-06-08 DIAGNOSIS — Z1231 Encounter for screening mammogram for malignant neoplasm of breast: Secondary | ICD-10-CM | POA: Diagnosis not present

## 2022-06-10 DIAGNOSIS — H35713 Central serous chorioretinopathy, bilateral: Secondary | ICD-10-CM | POA: Diagnosis not present

## 2022-06-13 ENCOUNTER — Other Ambulatory Visit: Payer: Self-pay | Admitting: Family Medicine

## 2022-06-27 ENCOUNTER — Encounter: Payer: Medicaid Other | Admitting: Family Medicine

## 2022-06-27 DIAGNOSIS — J3489 Other specified disorders of nose and nasal sinuses: Secondary | ICD-10-CM | POA: Diagnosis not present

## 2022-06-27 DIAGNOSIS — Z9889 Other specified postprocedural states: Secondary | ICD-10-CM | POA: Diagnosis not present

## 2022-06-28 DIAGNOSIS — M7551 Bursitis of right shoulder: Secondary | ICD-10-CM | POA: Diagnosis not present

## 2022-06-28 DIAGNOSIS — M19011 Primary osteoarthritis, right shoulder: Secondary | ICD-10-CM | POA: Diagnosis not present

## 2022-06-28 DIAGNOSIS — S41011A Laceration without foreign body of right shoulder, initial encounter: Secondary | ICD-10-CM | POA: Diagnosis not present

## 2022-06-28 DIAGNOSIS — S46811A Strain of other muscles, fascia and tendons at shoulder and upper arm level, right arm, initial encounter: Secondary | ICD-10-CM | POA: Diagnosis not present

## 2022-06-30 DIAGNOSIS — M25511 Pain in right shoulder: Secondary | ICD-10-CM | POA: Diagnosis not present

## 2022-07-10 ENCOUNTER — Telehealth: Payer: Medicaid Other | Admitting: Family

## 2022-07-10 DIAGNOSIS — J019 Acute sinusitis, unspecified: Secondary | ICD-10-CM

## 2022-07-10 MED ORDER — FLUCONAZOLE 150 MG PO TABS: 150.0000 mg | ORAL_TABLET | ORAL | 0 refills | Status: DC | PRN

## 2022-07-10 MED ORDER — CEPHALEXIN 500 MG PO CAPS: 500.0000 mg | ORAL_CAPSULE | Freq: Two times a day (BID) | ORAL | 0 refills | Status: DC

## 2022-07-10 NOTE — Progress Notes (Signed)
E-Visit for Sinus Problems  We are sorry that you are not feeling well.  Here is how we plan to help!  Based on what you have shared with me it looks like you have sinusitis.  Sinusitis is inflammation and infection in the sinus cavities of the head.  Based on your presentation I believe you most likely have Acute Bacterial Sinusitis.  This is an infection caused by bacteria and is treated with antibiotics. I have prescribed Keflex 500 mg twice a day for 7 days. You may use an oral decongestant such as Mucinex D or if you have glaucoma or high blood pressure use plain Mucinex. Saline nasal spray help and can safely be used as often as needed for congestion.  If you develop worsening sinus pain, fever or notice severe headache and vision changes, or if symptoms are not better after completion of antibiotic, please schedule an appointment with a health care provider.    I have also sent in diflucan to your pharmacy.   Sinus infections are not as easily transmitted as other respiratory infection, however we still recommend that you avoid close contact with loved ones, especially the very young and elderly.  Remember to wash your hands thoroughly throughout the day as this is the number one way to prevent the spread of infection!  Home Care: Only take medications as instructed by your medical team. Complete the entire course of an antibiotic. Do not take these medications with alcohol. A steam or ultrasonic humidifier can help congestion.  You can place a towel over your head and breathe in the steam from hot water coming from a faucet. Avoid close contacts especially the very young and the elderly. Cover your mouth when you cough or sneeze. Always remember to wash your hands.  Get Help Right Away If: You develop worsening fever or sinus pain. You develop a severe head ache or visual changes. Your symptoms persist after you have completed your treatment plan.  Make sure you Understand these  instructions. Will watch your condition. Will get help right away if you are not doing well or get worse.  Thank you for choosing an e-visit.  Your e-visit answers were reviewed by a board certified advanced clinical practitioner to complete your personal care plan. Depending upon the condition, your plan could have included both over the counter or prescription medications.  Please review your pharmacy choice. Make sure the pharmacy is open so you can pick up prescription now. If there is a problem, you may contact your provider through [employer] and have the prescription routed to another pharmacy.  Your safety is important to [place]. If you have drug allergies check your prescription carefully.   For the next 24 hours you can use MyChart to ask questions about today's visit, request a non-urgent call back, or ask for a work or school excuse. You will get an email in the next two days asking about your experience. I hope that your e-visit has been valuable and will speed your recovery.   Approximately 5 minutes was spent documenting and reviewing patient's chart.

## 2022-07-12 DIAGNOSIS — M25511 Pain in right shoulder: Secondary | ICD-10-CM | POA: Diagnosis not present

## 2022-07-26 ENCOUNTER — Telehealth: Payer: Self-pay | Admitting: Podiatry

## 2022-07-26 NOTE — Telephone Encounter (Signed)
PT is wanting a refill of her Rx for her toenail. Pt requested a 3 month supply.  Please advise.

## 2022-07-27 ENCOUNTER — Other Ambulatory Visit: Payer: Self-pay | Admitting: Podiatry

## 2022-07-27 MED ORDER — TERBINAFINE HCL 250 MG PO TABS: 250.0000 mg | ORAL_TABLET | Freq: Every day | ORAL | 0 refills | Status: DC

## 2022-07-27 NOTE — Telephone Encounter (Signed)
Refill sent.  Thanks, Dr. Amalia Hailey

## 2022-07-27 NOTE — Progress Notes (Signed)
Refill Lamisil 250 mg #90 daily.  CMP WNL 05/12/2022.

## 2022-07-28 DIAGNOSIS — G8929 Other chronic pain: Secondary | ICD-10-CM | POA: Diagnosis not present

## 2022-07-28 DIAGNOSIS — M25511 Pain in right shoulder: Secondary | ICD-10-CM | POA: Diagnosis not present

## 2022-08-10 DIAGNOSIS — F4312 Post-traumatic stress disorder, chronic: Secondary | ICD-10-CM | POA: Diagnosis not present

## 2022-08-10 DIAGNOSIS — F902 Attention-deficit hyperactivity disorder, combined type: Secondary | ICD-10-CM | POA: Diagnosis not present

## 2022-08-10 DIAGNOSIS — F4323 Adjustment disorder with mixed anxiety and depressed mood: Secondary | ICD-10-CM | POA: Diagnosis not present

## 2022-08-25 DIAGNOSIS — F4312 Post-traumatic stress disorder, chronic: Secondary | ICD-10-CM | POA: Diagnosis not present

## 2022-08-25 DIAGNOSIS — F902 Attention-deficit hyperactivity disorder, combined type: Secondary | ICD-10-CM | POA: Diagnosis not present

## 2022-08-25 DIAGNOSIS — F4323 Adjustment disorder with mixed anxiety and depressed mood: Secondary | ICD-10-CM | POA: Diagnosis not present

## 2022-09-15 DIAGNOSIS — K581 Irritable bowel syndrome with constipation: Secondary | ICD-10-CM | POA: Diagnosis not present

## 2022-11-02 ENCOUNTER — Telehealth: Payer: Medicaid Other | Admitting: Physician Assistant

## 2022-11-02 DIAGNOSIS — J019 Acute sinusitis, unspecified: Secondary | ICD-10-CM

## 2022-11-02 DIAGNOSIS — B9689 Other specified bacterial agents as the cause of diseases classified elsewhere: Secondary | ICD-10-CM | POA: Diagnosis not present

## 2022-11-02 MED ORDER — CEPHALEXIN 500 MG PO CAPS: 500.0000 mg | ORAL_CAPSULE | Freq: Two times a day (BID) | ORAL | 0 refills | Status: DC

## 2022-11-02 MED ORDER — FLUCONAZOLE 150 MG PO TABS: 150.0000 mg | ORAL_TABLET | ORAL | 0 refills | Status: DC | PRN

## 2022-11-02 NOTE — Progress Notes (Signed)
E-Visit for Sinus Problems  We are sorry that you are not feeling well.  Here is how we plan to help!  Based on what you have shared with me it looks like you have sinusitis.  Sinusitis is inflammation and infection in the sinus cavities of the head.  Based on your presentation I believe you most likely have Acute Bacterial Sinusitis.  This is an infection caused by bacteria and is treated with antibiotics. I have prescribed Keflex '500mg'$  Take 1 capsule twice daily for 7 days. I have also prescribed Diflucan for possible yeast infection. You may use an oral decongestant such as Mucinex D or if you have glaucoma or high blood pressure use plain Mucinex. Saline nasal spray help and can safely be used as often as needed for congestion.  If you develop worsening sinus pain, fever or notice severe headache and vision changes, or if symptoms are not better after completion of antibiotic, please schedule an appointment with a health care provider.    Sinus infections are not as easily transmitted as other respiratory infection, however we still recommend that you avoid close contact with loved ones, especially the very young and elderly.  Remember to wash your hands thoroughly throughout the day as this is the number one way to prevent the spread of infection!  Home Care: Only take medications as instructed by your medical team. Complete the entire course of an antibiotic. Do not take these medications with alcohol. A steam or ultrasonic humidifier can help congestion.  You can place a towel over your head and breathe in the steam from hot water coming from a faucet. Avoid close contacts especially the very young and the elderly. Cover your mouth when you cough or sneeze. Always remember to wash your hands.  Get Help Right Away If: You develop worsening fever or sinus pain. You develop a severe head ache or visual changes. Your symptoms persist after you have completed your treatment plan.  Make sure  you Understand these instructions. Will watch your condition. Will get help right away if you are not doing well or get worse.  Thank you for choosing an e-visit.  Your e-visit answers were reviewed by a board certified advanced clinical practitioner to complete your personal care plan. Depending upon the condition, your plan could have included both over the counter or prescription medications.  Please review your pharmacy choice. Make sure the pharmacy is open so you can pick up prescription now. If there is a problem, you may contact your provider through [employer] and have the prescription routed to another pharmacy.  Your safety is important to [place]. If you have drug allergies check your prescription carefully.   For the next 24 hours you can use MyChart to ask questions about today's visit, request a non-urgent call back, or ask for a work or school excuse. You will get an email in the next two days asking about your experience. I hope that your e-visit has been valuable and will speed your recovery.  I have spent 5 minutes in review of e-visit questionnaire, review and updating patient chart, medical decision making and response to patient.   Mar Daring, PA-C

## 2022-11-07 DIAGNOSIS — F419 Anxiety disorder, unspecified: Secondary | ICD-10-CM | POA: Diagnosis not present

## 2022-11-07 DIAGNOSIS — F902 Attention-deficit hyperactivity disorder, combined type: Secondary | ICD-10-CM | POA: Diagnosis not present

## 2022-11-07 DIAGNOSIS — F4312 Post-traumatic stress disorder, chronic: Secondary | ICD-10-CM | POA: Diagnosis not present

## 2022-11-21 DIAGNOSIS — S93602A Unspecified sprain of left foot, initial encounter: Secondary | ICD-10-CM | POA: Diagnosis not present

## 2022-11-24 ENCOUNTER — Other Ambulatory Visit: Payer: Self-pay | Admitting: Physician Assistant

## 2022-11-24 ENCOUNTER — Other Ambulatory Visit: Payer: Self-pay | Admitting: Podiatry

## 2022-12-07 ENCOUNTER — Other Ambulatory Visit: Payer: Self-pay | Admitting: Podiatry

## 2022-12-20 DIAGNOSIS — N951 Menopausal and female climacteric states: Secondary | ICD-10-CM | POA: Diagnosis not present

## 2022-12-20 DIAGNOSIS — E039 Hypothyroidism, unspecified: Secondary | ICD-10-CM | POA: Diagnosis not present

## 2022-12-29 ENCOUNTER — Telehealth: Payer: Medicaid Other | Admitting: Physician Assistant

## 2022-12-29 DIAGNOSIS — B9689 Other specified bacterial agents as the cause of diseases classified elsewhere: Secondary | ICD-10-CM | POA: Diagnosis not present

## 2022-12-29 DIAGNOSIS — J019 Acute sinusitis, unspecified: Secondary | ICD-10-CM

## 2022-12-29 MED ORDER — CEPHALEXIN 500 MG PO CAPS: 500.0000 mg | ORAL_CAPSULE | Freq: Two times a day (BID) | ORAL | 0 refills | Status: AC

## 2022-12-29 NOTE — Progress Notes (Signed)
E-Visit for Sinus Problems  We are sorry that you are not feeling well.  Here is how we plan to help!  Based on what you have shared with me it looks like you have sinusitis.  Sinusitis is inflammation and infection in the sinus cavities of the head.  Based on your presentation I believe you most likely have Acute Bacterial Sinusitis.  This is an infection caused by bacteria and is treated with antibiotics. Giving your medication allergies and past response to specific treatments, I have prescribed Keflex 500 mg twice daily for 10 days.  You may use an oral decongestant such as Mucinex D or if you have glaucoma or high blood pressure use plain Mucinex. Saline nasal spray help and can safely be used as often as needed for congestion.  If you develop worsening sinus pain, fever or notice severe headache and vision changes, or if symptoms are not better after completion of antibiotic, please schedule an appointment with a health care provider.    Sinus infections are not as easily transmitted as other respiratory infection, however we still recommend that you avoid close contact with loved ones, especially the very young and elderly.  Remember to wash your hands thoroughly throughout the day as this is the number one way to prevent the spread of infection!  Home Care: Only take medications as instructed by your medical team. Complete the entire course of an antibiotic. Do not take these medications with alcohol. A steam or ultrasonic humidifier can help congestion.  You can place a towel over your head and breathe in the steam from hot water coming from a faucet. Avoid close contacts especially the very young and the elderly. Cover your mouth when you cough or sneeze. Always remember to wash your hands.  Get Help Right Away If: You develop worsening fever or sinus pain. You develop a severe head ache or visual changes. Your symptoms persist after you have completed your treatment plan.  Make sure  you Understand these instructions. Will watch your condition. Will get help right away if you are not doing well or get worse.  Thank you for choosing an e-visit.  Your e-visit answers were reviewed by a board certified advanced clinical practitioner to complete your personal care plan. Depending upon the condition, your plan could have included both over the counter or prescription medications.  Please review your pharmacy choice. Make sure the pharmacy is open so you can pick up prescription now. If there is a problem, you may contact your provider through [employer] and have the prescription routed to another pharmacy.  Your safety is important to [place]. If you have drug allergies check your prescription carefully.   For the next 24 hours you can use MyChart to ask questions about today's visit, request a non-urgent call back, or ask for a work or school excuse. You will get an email in the next two days asking about your experience. I hope that your e-visit has been valuable and will speed your recovery.

## 2022-12-29 NOTE — Progress Notes (Signed)
I have spent 5 minutes in review of e-visit questionnaire, review and updating patient chart, medical decision making and response to patient.    Cody , PA-C    

## 2023-01-09 IMAGING — MG MM DIGITAL SCREENING BILAT W/ TOMO AND CAD
6 of 12 series · 6 of 36 positions shown · non-contrast
Comparison: Previous exam(s).

CLINICAL DATA: Screening.

EXAM:
DIGITAL SCREENING BILATERAL MAMMOGRAM WITH TOMOSYNTHESIS AND CAD
TECHNIQUE: Bilateral screening digital craniocaudal and mediolateral oblique
mammograms were obtained. Bilateral screening digital breast
tomosynthesis was performed. The images were evaluated with
computer-aided detection.

[L CC synth-2D]
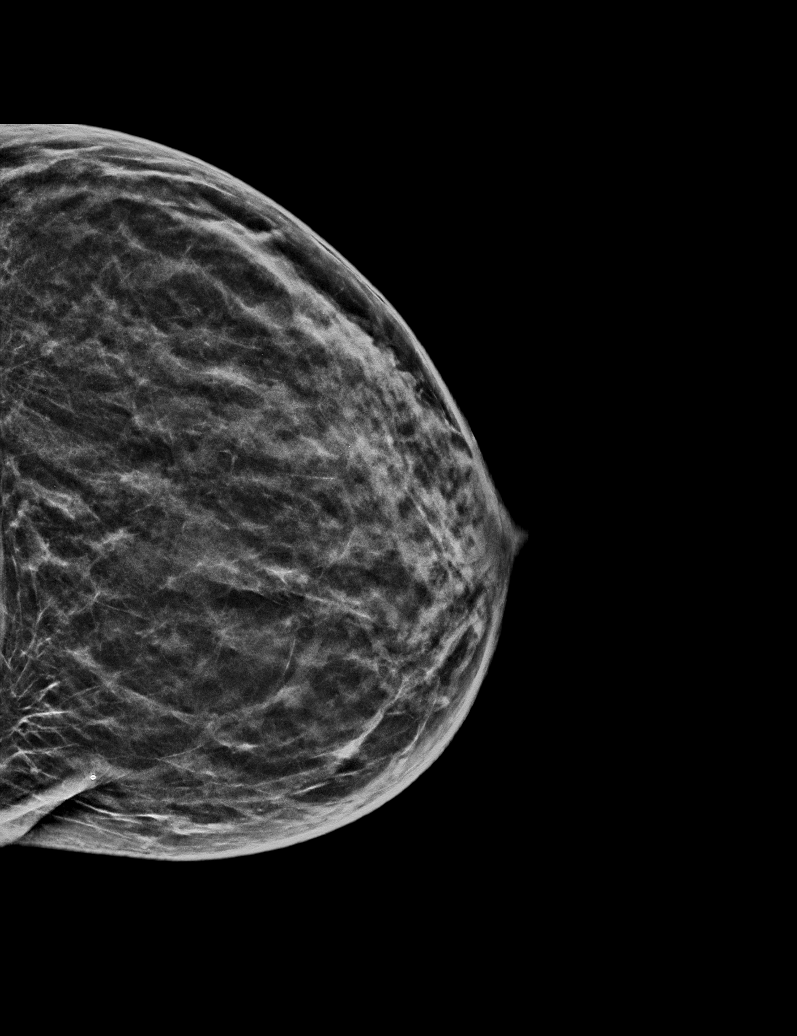

[L CV synth-2D]
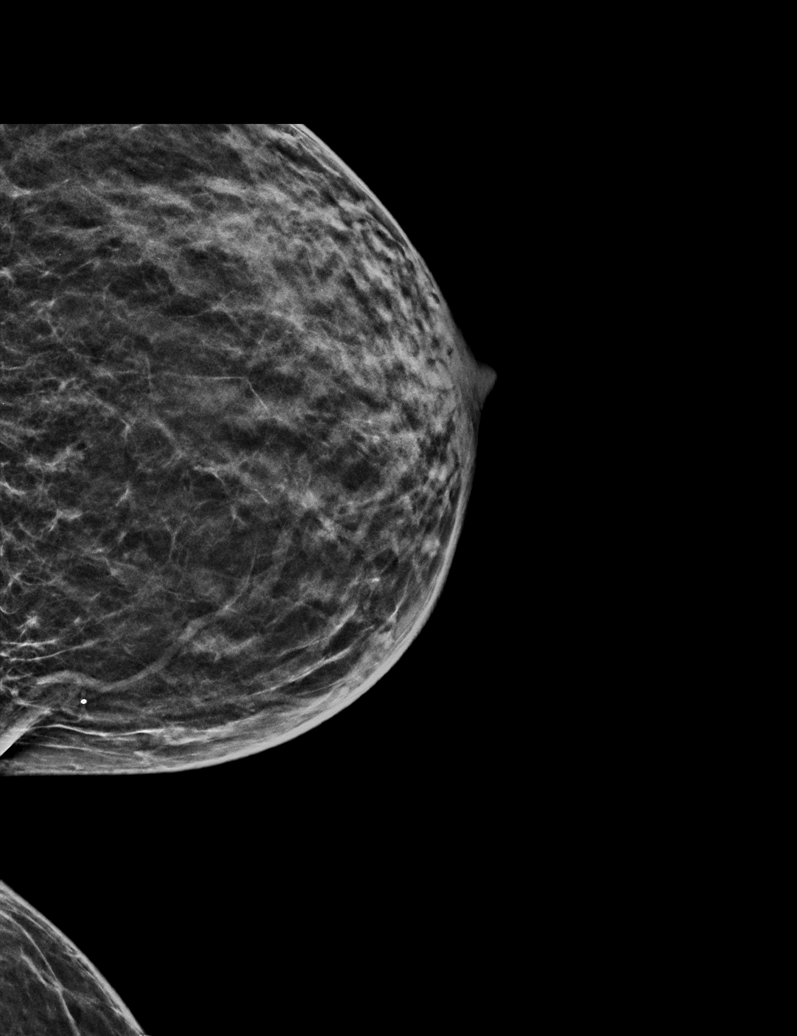

[R MLO synth-2D (1 of 2)]
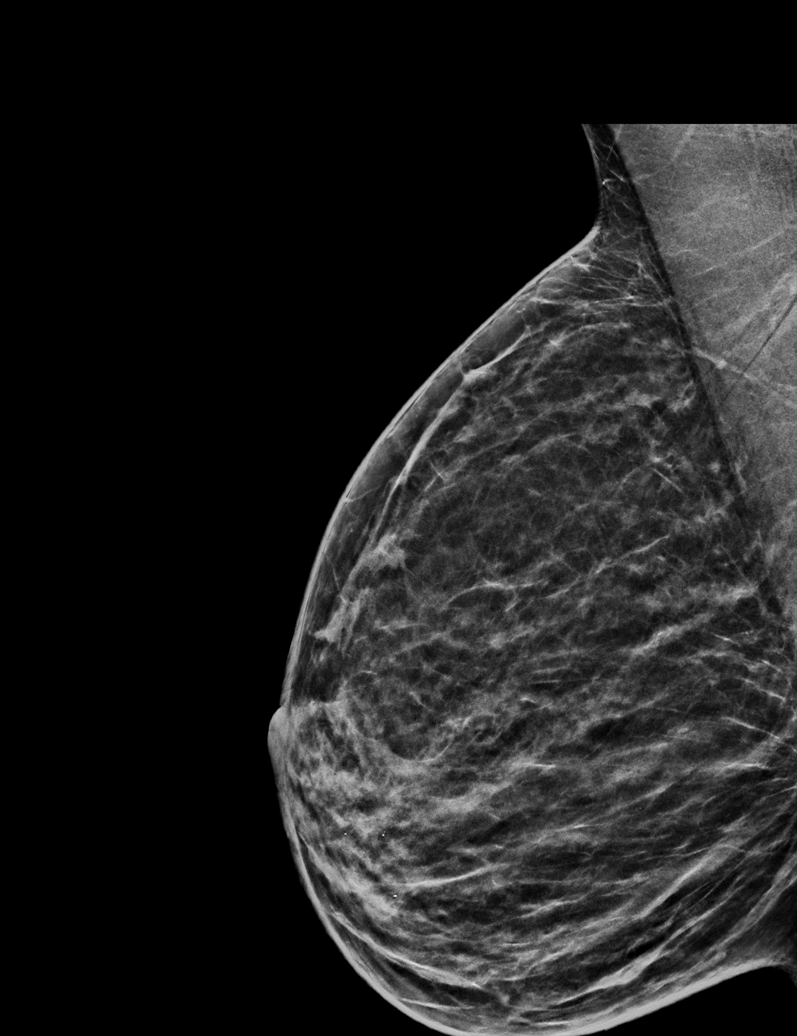

[L MLO synth-2D]
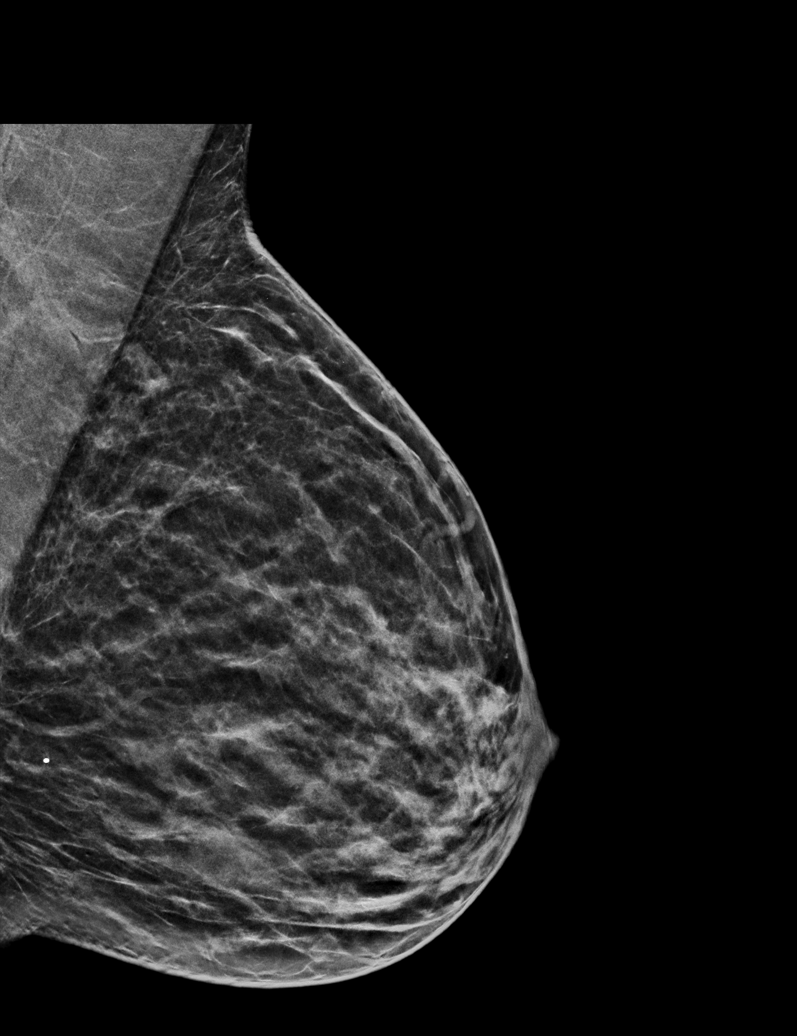

[R CC synth-2D]
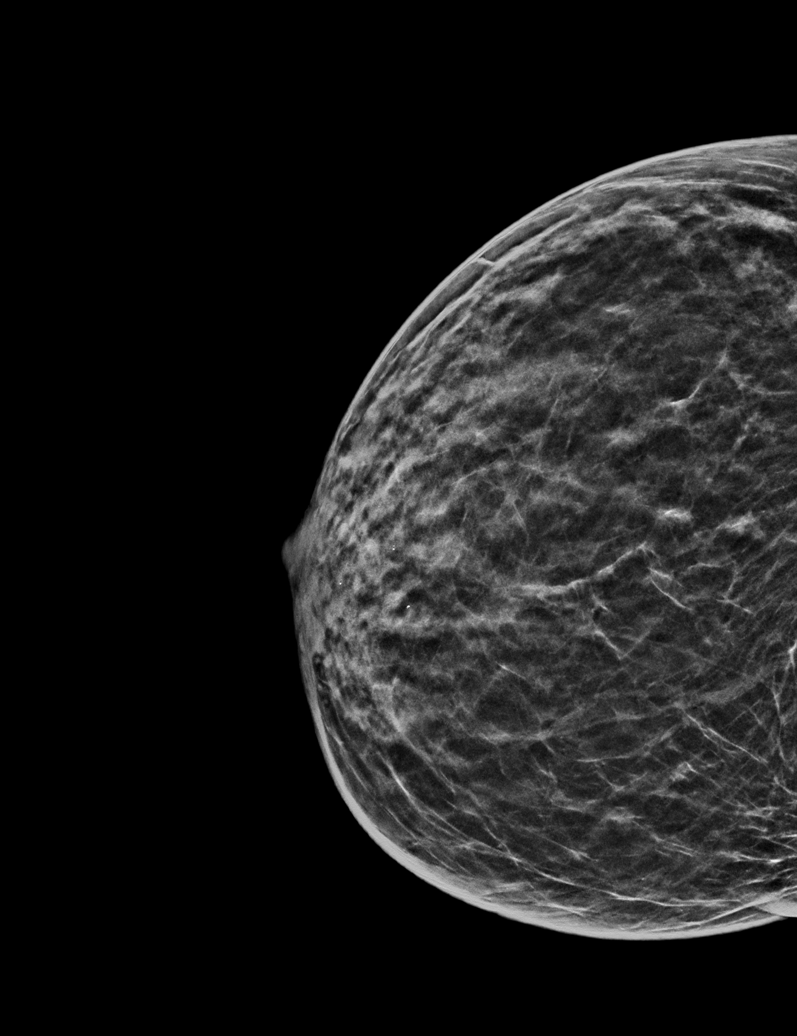

[R MLO synth-2D (2 of 2)]
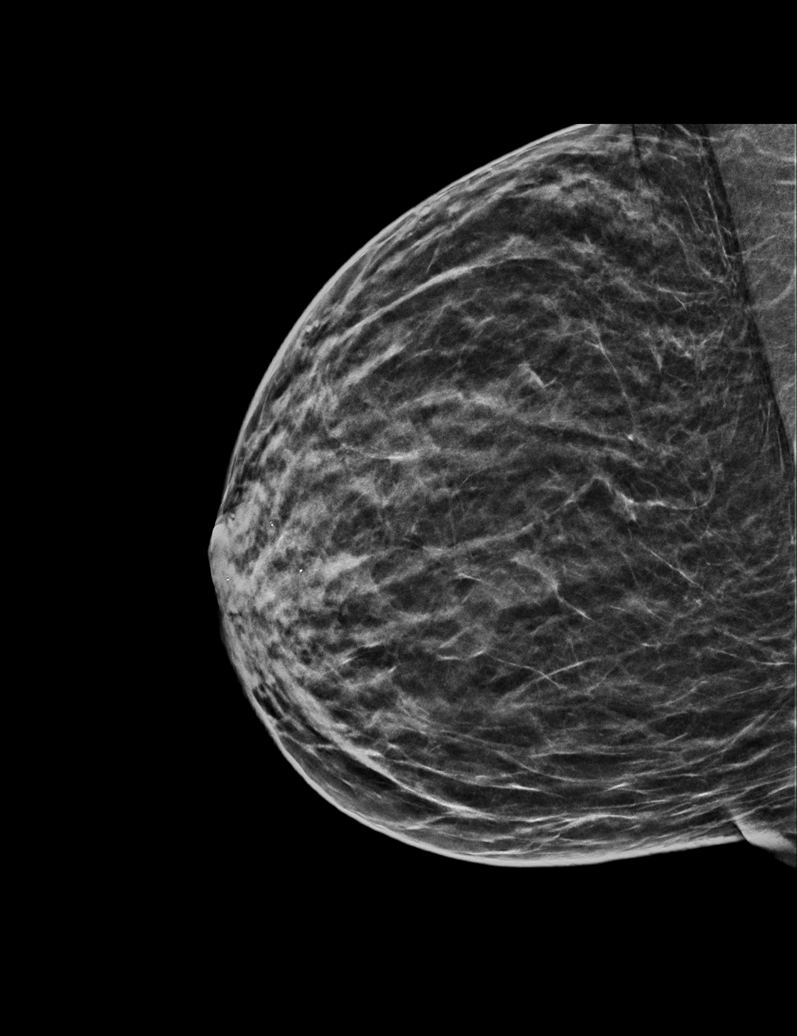

[6 of 36 positions shown; findings below may reference images not displayed]

ACR Breast Density Category c: The breast tissue is heterogeneously
dense, which may obscure small masses.
FINDINGS: There are no findings suspicious for malignancy. The images were
evaluated with computer-aided detection.
IMPRESSION: No mammographic evidence of malignancy. A result letter of this
screening mammogram will be mailed directly to the patient.

RECOMMENDATION:
Screening mammogram in one year. (Code:T4-5-GWO)

BI-RADS CATEGORY  1: Negative.

## 2023-01-10 DIAGNOSIS — N898 Other specified noninflammatory disorders of vagina: Secondary | ICD-10-CM | POA: Diagnosis not present

## 2023-01-10 DIAGNOSIS — Z6823 Body mass index (BMI) 23.0-23.9, adult: Secondary | ICD-10-CM | POA: Diagnosis not present

## 2023-01-10 DIAGNOSIS — R6882 Decreased libido: Secondary | ICD-10-CM | POA: Diagnosis not present

## 2023-01-10 DIAGNOSIS — N951 Menopausal and female climacteric states: Secondary | ICD-10-CM | POA: Diagnosis not present

## 2023-01-23 DIAGNOSIS — Z1211 Encounter for screening for malignant neoplasm of colon: Secondary | ICD-10-CM | POA: Diagnosis not present

## 2023-01-23 DIAGNOSIS — K644 Residual hemorrhoidal skin tags: Secondary | ICD-10-CM | POA: Diagnosis not present

## 2023-02-07 DIAGNOSIS — F902 Attention-deficit hyperactivity disorder, combined type: Secondary | ICD-10-CM | POA: Diagnosis not present

## 2023-02-07 DIAGNOSIS — F4312 Post-traumatic stress disorder, chronic: Secondary | ICD-10-CM | POA: Diagnosis not present

## 2023-02-07 DIAGNOSIS — F419 Anxiety disorder, unspecified: Secondary | ICD-10-CM | POA: Diagnosis not present

## 2023-02-10 DIAGNOSIS — E538 Deficiency of other specified B group vitamins: Secondary | ICD-10-CM | POA: Diagnosis not present

## 2023-02-21 ENCOUNTER — Telehealth: Payer: Medicaid Other | Admitting: Physician Assistant

## 2023-02-21 DIAGNOSIS — H109 Unspecified conjunctivitis: Secondary | ICD-10-CM

## 2023-02-21 MED ORDER — POLYMYXIN B-TRIMETHOPRIM 10000-0.1 UNIT/ML-% OP SOLN: 1.0000 [drp] | Freq: Four times a day (QID) | OPHTHALMIC | 0 refills | Status: DC

## 2023-02-21 NOTE — Progress Notes (Signed)
E-Visit for [employer]   We are sorry that you are not feeling well.  Here is how we plan to help!  Based on what you have shared with me it looks like you have conjunctivitis.  Conjunctivitis is a common inflammatory or infectious condition of the eye that is often referred to as "pink eye".  In most cases it is contagious (viral or bacterial). However, not all conjunctivitis requires antibiotics (ex. Allergic).  We have made appropriate suggestions for you based upon your presentation.  I have prescribed Polytrim Ophthalmic drops 1-2 drops 4 times a day times 5 days  Pink eye can be highly contagious.  It is typically spread through direct contact with secretions, or contaminated objects or surfaces that one may have touched.  Strict handwashing is suggested with soap and water is urged.  If not available, use alcohol based had sanitizer.  Avoid unnecessary touching of the eye.  If you wear contact lenses, you will need to refrain from wearing them until you see no white discharge from the eye for at least 24 hours after being on medication.  You should see symptom improvement in 1-2 days after starting the medication regimen.  Call us if symptoms are not improved in 1-2 days.  Home Care: Wash your hands often! Do not wear your contacts until you complete your treatment plan. Avoid sharing towels, bed linen, personal items with a person who has pink eye. See attention for anyone in your home with similar symptoms.  Get Help Right Away If: Your symptoms do not improve. You develop blurred or loss of vision. Your symptoms worsen (increased discharge, pain or redness)   Thank you for choosing an e-visit.  Your e-visit answers were reviewed by a board certified advanced clinical practitioner to complete your personal care plan. Depending upon the condition, your plan could have included both over the counter or prescription medications.  Please review your pharmacy choice. Make sure the  pharmacy is open so you can pick up prescription now. If there is a problem, you may contact your provider through [employer] and have the prescription routed to another pharmacy.  Your safety is important to [place]. If you have drug allergies check your prescription carefully.   For the next 24 hours you can use MyChart to ask questions about today's visit, request a non-urgent call back, or ask for a work or school excuse. You will get an email in the next two days asking about your experience. I hope that your e-visit has been valuable and will speed your recovery.   I have spent 5 minutes in review of e-visit questionnaire, review and updating patient chart, medical decision making and response to patient.   Lenise Arena Ward, PA-C

## 2023-02-24 ENCOUNTER — Other Ambulatory Visit: Payer: Self-pay | Admitting: Family Medicine

## 2023-03-07 DIAGNOSIS — S46801S Unspecified injury of other muscles, fascia and tendons at shoulder and upper arm level, right arm, sequela: Secondary | ICD-10-CM | POA: Diagnosis not present

## 2023-03-07 DIAGNOSIS — M25511 Pain in right shoulder: Secondary | ICD-10-CM | POA: Diagnosis not present

## 2023-03-07 DIAGNOSIS — G8929 Other chronic pain: Secondary | ICD-10-CM | POA: Diagnosis not present

## 2023-03-13 DIAGNOSIS — E039 Hypothyroidism, unspecified: Secondary | ICD-10-CM | POA: Diagnosis not present

## 2023-03-13 DIAGNOSIS — N951 Menopausal and female climacteric states: Secondary | ICD-10-CM | POA: Diagnosis not present

## 2023-03-16 DIAGNOSIS — N951 Menopausal and female climacteric states: Secondary | ICD-10-CM | POA: Diagnosis not present

## 2023-03-16 DIAGNOSIS — Z6823 Body mass index (BMI) 23.0-23.9, adult: Secondary | ICD-10-CM | POA: Diagnosis not present

## 2023-03-16 DIAGNOSIS — N898 Other specified noninflammatory disorders of vagina: Secondary | ICD-10-CM | POA: Diagnosis not present

## 2023-03-16 DIAGNOSIS — R232 Flushing: Secondary | ICD-10-CM | POA: Diagnosis not present

## 2023-04-06 ENCOUNTER — Other Ambulatory Visit: Payer: Self-pay | Admitting: Family Medicine

## 2023-04-06 NOTE — Telephone Encounter (Signed)
Unable to refill per protocol, Rx request is too soon. Last refill 02/24/23 for 90 days.  Requested Prescriptions  Pending Prescriptions Disp Refills   furosemide (LASIX) 20 MG tablet [Pharmacy Med Name: FUROSEMIDE ] 90 tablet 0    Sig: TAKE 1 TABLET BY MOUTH ONCE DAILY AS NEEDED FOR SWELLING     Cardiovascular:  Diuretics - Loop Failed - 04/06/2023 11:57 AM      Failed - K in normal range and within 180 days    Potassium  Date Value Ref Range Status  05/12/2022 4.3 3.5 - 5.2 mmol/L Final         Failed - Ca in normal range and within 180 days    Calcium  Date Value Ref Range Status  05/12/2022 9.2 8.7 - 10.2 mg/dL Final         Failed - Na in normal range and within 180 days    Sodium  Date Value Ref Range Status  05/12/2022 136 134 - 144 mmol/L Final         Failed - Cr in normal range and within 180 days    Creat  Date Value Ref Range Status  04/10/2018 0.99 0.50 - 1.10 mg/dL Final   Creatinine, Ser  Date Value Ref Range Status  05/12/2022 0.99 0.57 - 1.00 mg/dL Final         Failed - Cl in normal range and within 180 days    Chloride  Date Value Ref Range Status  05/12/2022 101 96 - 106 mmol/L Final         Failed - Mg Level in normal range and within 180 days    No results found for: "MG"       Failed - Valid encounter within last 6 months    Recent Outpatient Visits           10 months ago Encounter for annual physical exam   [place] [facility] [place], Marzella Schlein, MD   1 year ago Adenopathy, cervical   [place] [facility] Alfredia Ferguson, PA-C   1 year ago Rheumatoid arthritis, involving unspecified site, unspecified whether rheumatoid factor present [facility])   [place] [facility] [place], Marzella Schlein, MD   1 year ago Chronic fatigue   [place] [facility] [place], Marzella Schlein, MD   1 year ago Hypothyroidism, unspecified type   [place] [facility] [place],  Jodell Cipro, PA-C       Future Appointments             In 1 month [place], Marzella Schlein, MD [facility]            Passed - Last BP in normal range    BP Readings from Last 1 Encounters:  05/12/22 125/81

## 2023-05-15 ENCOUNTER — Encounter: Payer: Medicaid Other | Admitting: Family Medicine

## 2023-05-22 ENCOUNTER — Other Ambulatory Visit: Payer: Self-pay | Admitting: Family Medicine

## 2023-05-23 ENCOUNTER — Ambulatory Visit: Payer: Medicaid Other | Admitting: Podiatry

## 2023-05-29 DIAGNOSIS — N951 Menopausal and female climacteric states: Secondary | ICD-10-CM | POA: Diagnosis not present

## 2023-05-29 DIAGNOSIS — Z01419 Encounter for gynecological examination (general) (routine) without abnormal findings: Secondary | ICD-10-CM | POA: Diagnosis not present

## 2023-06-02 ENCOUNTER — Ambulatory Visit: Payer: Medicaid Other | Admitting: Podiatry

## 2023-06-13 DIAGNOSIS — F419 Anxiety disorder, unspecified: Secondary | ICD-10-CM | POA: Diagnosis not present

## 2023-06-13 DIAGNOSIS — G43101 Migraine with aura, not intractable, with status migrainosus: Secondary | ICD-10-CM | POA: Diagnosis not present

## 2023-06-13 DIAGNOSIS — F902 Attention-deficit hyperactivity disorder, combined type: Secondary | ICD-10-CM | POA: Diagnosis not present

## 2023-06-13 DIAGNOSIS — F4312 Post-traumatic stress disorder, chronic: Secondary | ICD-10-CM | POA: Diagnosis not present

## 2023-06-13 DIAGNOSIS — F32A Depression, unspecified: Secondary | ICD-10-CM | POA: Diagnosis not present

## 2023-06-26 ENCOUNTER — Encounter: Payer: Self-pay | Admitting: Family Medicine

## 2023-06-26 ENCOUNTER — Other Ambulatory Visit: Payer: Self-pay | Admitting: Family Medicine

## 2023-06-26 ENCOUNTER — Ambulatory Visit: Payer: Medicaid Other | Admitting: Family Medicine

## 2023-06-26 VITALS — BP 125/85 | HR 101 | Temp 98.1°F | Resp 14 | Ht 67.0 in | Wt 145.3 lb

## 2023-06-26 DIAGNOSIS — Z1231 Encounter for screening mammogram for malignant neoplasm of breast: Secondary | ICD-10-CM

## 2023-06-26 DIAGNOSIS — J4540 Moderate persistent asthma, uncomplicated: Secondary | ICD-10-CM | POA: Diagnosis not present

## 2023-06-26 DIAGNOSIS — F331 Major depressive disorder, recurrent, moderate: Secondary | ICD-10-CM | POA: Diagnosis not present

## 2023-06-26 DIAGNOSIS — B351 Tinea unguium: Secondary | ICD-10-CM

## 2023-06-26 DIAGNOSIS — Z0000 Encounter for general adult medical examination without abnormal findings: Secondary | ICD-10-CM | POA: Diagnosis not present

## 2023-06-26 DIAGNOSIS — M069 Rheumatoid arthritis, unspecified: Secondary | ICD-10-CM | POA: Diagnosis not present

## 2023-06-26 DIAGNOSIS — E039 Hypothyroidism, unspecified: Secondary | ICD-10-CM

## 2023-06-26 DIAGNOSIS — Z862 Personal history of diseases of the blood and blood-forming organs and certain disorders involving the immune mechanism: Secondary | ICD-10-CM

## 2023-06-26 MED ORDER — TERBINAFINE HCL 250 MG PO TABS: 250.0000 mg | ORAL_TABLET | Freq: Every day | ORAL | 3 refills | Status: DC

## 2023-06-26 MED ORDER — ADVAIR DISKUS 250-50 MCG/ACT IN AEPB: 1.0000 | INHALATION_SPRAY | Freq: Two times a day (BID) | RESPIRATORY_TRACT | 1 refills | Status: DC

## 2023-06-26 MED ORDER — FUROSEMIDE 20 MG PO TABS: ORAL_TABLET | ORAL | 0 refills | Status: DC

## 2023-06-26 NOTE — Progress Notes (Signed)
Complete physical exam   Patient: [name]   DOB: [birth year]   47 y.o. Female  MRN: [id number] Visit Date: 06/26/2023  Today's healthcare provider: Shirlee Latch, MD   Chief Complaint  Patient presents with   Annual Exam   Asthma    Patient states the her asthma has gotten worse, is now using at least once a day now, used to be just exercise induced. States to have had an asthma attack when she went out on a walk. States she would like to have nebulizer again for when inhaler does not help.   Nail Problem    Would like to have prescription again for fungus that has reoccurred.   Subjective    [name] is a 47 y.o. female who presents today for a complete physical exam.  She reports consuming a general diet.  She generally feels well. She reports sleeping well. She does have additional problems to discuss today.  HPI  Discussed the use of AI scribe software for clinical note transcription with the patient, who gave verbal consent to proceed.   HPI     Asthma    Additional comments: Patient states the her asthma has gotten worse, is now using at least once a day now, used to be just exercise induced. States to have had an asthma attack when she went out on a walk. States she would like to have nebulizer again for when inhaler does not help.        Nail Problem    Additional comments: Would like to have prescription again for fungus that has reoccurred.      Last edited by Lubertha Basque, CMA on 06/26/2023 11:15 AM.      Discussed the use of AI scribe software for clinical note transcription with the patient, who gave verbal consent to proceed.  History of Present Illness   The patient, with a history of ADHD, asthma, and toenail fungus, presents with worsening asthma symptoms requiring daily use of a rescue inhaler. The patient reports that their asthma, previously only triggered by exercise and laughter, has become more severe and unpredictable, with  attacks induced by exposure to cleaning chemicals and sudden temperature changes. The patient also reports a recent severe asthma attack during a walk, which was particularly distressing as she did not have their inhaler with them.  The patient also reports a toenail fungus that has become painful and has not responded to topical treatments. They have a history of needing terbinafine for similar issues in the past.  The patient also discusses struggles with weight gain, which they attribute to hormonal changes related to perimenopause and the use of a hormone patch. They report gaining weight rapidly when the dose of the patch was increased, and are now slowly losing weight on a lower dose.  The patient also mentions having had COVID-19 twice, with mild symptoms each time. They express a reluctance to receive vaccinations due to a history of severe facial reaction and an autoimmune condition.       Past Medical History:  Diagnosis Date   ADD (attention deficit disorder)    ADHD    Allergy 02/1993   penecillin/throat swelling   Anemia    Asthma    Bone spur    "front of spine" - effects right shoulder (sometimes)   Central serous chorioretinopathy, right eye    Depression    Deviated nasal septum    Dysplastic nevus 08/21/2019   Right lower  abdomen. Moderate atypia, margins free.   History of sinus surgery 09/28/2020   Hypothyroidism    Nondisplaced fracture of fifth left metatarsal bone    PTSD (post-traumatic stress disorder)    RA (rheumatoid arthritis) (HCC) 2016   positive ANA   Rheumatoid arthritis (HCC) 12/09/2016   Sinusitis    Past Surgical History:  Procedure Laterality Date   ARTHROPLASTY  10/02/2020   C5-6 C6-7    BACK SURGERY     CESAREAN SECTION     DILATION AND CURETTAGE OF UTERUS     (x2)   FRONTAL SINUS EXPLORATION Bilateral 11/19/2015   Procedure: FRONTAL SINUS EXPLORATION;  Surgeon: Vernie Murders, MD;  [facility];  Service: ENT;   Laterality: Bilateral;   IMAGE GUIDED SINUS SURGERY N/A 11/19/2015   Procedure: IMAGE GUIDED SINUS SURGERY;  Surgeon: Vernie Murders, MD;  Location: [facility];  Service: ENT;  Laterality: N/A;  GAVE DISK TO CE CE 09/22   JOINT REPLACEMENT  10/01/20   Double cervical disc arthoplasty   MAXILLARY ANTROSTOMY Bilateral 11/19/2015   Procedure: MAXILLARY ANTROSTOMY;  Surgeon: Vernie Murders, MD;  Location: [facility];  Service: ENT;  Laterality: Bilateral;   SEPTOPLASTY N/A 11/19/2015   Procedure: SEPTOPLASTY;  Surgeon: Vernie Murders, MD;  Location: [facility];  Service: ENT;  Laterality: N/A;   SPHENOIDECTOMY Bilateral 11/19/2015   Procedure: Selina Cooley;  Surgeon: Vernie Murders, MD;  Location: [facility];  Service: ENT;  Laterality: Bilateral;   SPINE SURGERY  10/02/2020   cervical myelopathy-double/two-level arthoplasty   TONSILLECTOMY     Social History   Socioeconomic History   Marital status: Divorced    Spouse name: Not on file   Number of children: 2   Years of education: Not on file   Highest education level: Not on file  Occupational History    Employer: [employer]   Tobacco Use   Smoking status: Never   Smokeless tobacco: Never  Vaping Use   Vaping status: Never Used  Substance and Sexual Activity   Alcohol use: Yes    Alcohol/week: 7.0 standard drinks of alcohol    Types: 7 Glasses of wine per week    Comment: Per week   Drug use: Never   Sexual activity: Yes    Birth control/protection: Pill  Other Topics Concern   Not on file  Social History Narrative   Not on file   Social Determinants of Health   Financial Resource Strain: Low Risk  (11/08/2021)   Received from [facility], [facility]   Overall Financial Resource Strain (CARDIA)    Difficulty of Paying Living Expenses: Not very hard  Food Insecurity: Food Insecurity Present (11/08/2021)   Received from [facility], [facility]   Hunger Vital Sign    Worried About Running Out of Food in the Last Year: Sometimes true    Ran Out of Food in the Last Year: Never true  Transportation Needs: No Transportation Needs (11/08/2021)   Received from [facility], [facility]   [facility] - Transportation    In the past 12 months, has lack of transportation kept you from medical appointments or from getting medications?: No    Lack of Transportation (Non-Medical): No  Physical Activity: Sufficiently Active (11/08/2021)   Received from [facility], [facility]   Exercise Vital Sign    Days of Exercise per Week: 5  days    Minutes of Exercise per Session: 60 min  Stress: No Stress Concern Present (11/08/2021)   Received from [facility], [facility]   [employer] [facility] [employer] - Occupational Stress Questionnaire    Feeling of Stress : Only a little  Social Connections: Not on file  Intimate Partner Violence: Not on file   Family Status  Relation Name Status   Mother mother Deceased   PGF paternal grandfather (Not Specified)   Cousin  (Not Specified)   Father father Alive   Brother brother Alive   Son  Alive   Daughter  Alive       legally blind in left eye    MGM maternal grandmother (Not Specified)   PGM paternal grandmother (Not Specified)   Mat Uncle maternbal uncle (Not Specified)   Son Son (Not Specified)   Neg Hx  (Not Specified)  No partnership data on file   Family History  Problem Relation Age of Onset   Rheum arthritis Mother    Alcohol abuse Mother    Arthritis Mother    Asthma Mother    Cancer Mother    Depression Mother    Cancer Paternal Grandfather    Cancer Cousin    Vascular Disease Father    Hypertension Father    Alcohol abuse Brother        history of    Drug abuse Brother        history of    ADD / ADHD Brother     Depression Brother    Arthritis Maternal Grandmother    Stroke Paternal Grandmother    Arthritis Maternal Uncle    Asthma Son    Breast cancer Neg Hx    Allergies  Allergen Reactions   Penicillins Swelling    Pt states caused throat to swell    Sulfa Antibiotics Other (See Comments)    Lips tingled    Patient Care Team: Erasmo Downer, MD as PCP - General (Family Medicine)   Medications: Outpatient Medications Prior to Visit  Medication Sig   albuterol (ACCUNEB) 0.63 MG/3ML nebulizer solution Take 1 ampule by nebulization every 6 (six) hours as needed for wheezing.   albuterol (VENTOLIN HFA) 108 (90 Base) MCG/ACT inhaler Inhale 2 puffs into the lungs every 6 (six) hours as needed for wheezing or shortness of breath.   buPROPion (WELLBUTRIN XL) 300 MG 24 hr tablet Take by mouth.   clonazePAM (KLONOPIN) 0.5 MG tablet Take 0.5 mg by mouth daily as needed.   ELIDEL 1 % cream Apply topically 2 (two) times daily.   estradiol (CLIMARA - DOSED IN MG/24 HR) 0.025 mg/24hr patch Place 0.025 mg onto the skin once a week.   fluconazole (DIFLUCAN) 150 MG tablet Take 1 tablet (150 mg total) by mouth every 3 (three) days as needed.   furosemide (LASIX) 20 MG tablet TAKE 1 TABLET BY MOUTH ONCE DAILY AS NEEDED FOR SWELLING   LINZESS 145 MCG CAPS capsule Take 145 mcg by mouth daily.   lisdexamfetamine (VYVANSE) 50 MG capsule Take 1 capsule (50 mg total) by mouth daily.   meloxicam (MOBIC) 15 MG tablet TAKE 1 TABLET BY MOUTH EVERY DAY WITH THE LARGEST MEAL   methocarbamol (ROBAXIN) 500 MG tablet Take by mouth as needed.   mometasone (ELOCON) 0.1 % cream Apply topically.   naratriptan (AMERGE) 2.5 MG tablet Take 1 tablet (2.5 mg total) by mouth as needed for migraine. Take one (1) tablet at  onset of headache; if returns or does not resolve, may repeat after 4 hours; do not exceed five (5) mg in 24 hours.   norethindrone-ethinyl estradiol-iron (LOESTRIN FE) 1.5-30 MG-MCG tablet Take 1 tablet by  mouth daily.   progesterone (PROMETRIUM) 100 MG capsule Take 100 mg by mouth at bedtime.   SUMAtriptan (IMITREX) 100 MG tablet TK 1 T PO AOS OF HEADACHE. REPEAT IN 2 H AS NEEDED. MAX DOSE 2 T IN 24 H   traZODone (DESYREL) 50 MG tablet Take 75 mg by mouth at bedtime.   cyanocobalamin (,VITAMIN B-12,) 1000 MCG/ML injection cyanocobalamin (vit B-12) 1,000 mcg/mL injection solution (Patient not taking: Reported on 06/26/2023)   eplerenone (INSPRA) 25 MG tablet Take 1 tablet by mouth 2 (two) times daily.   estradiol (ESTRACE) 1 MG tablet Take 1 mg by mouth daily. (Patient not taking: Reported on 06/26/2023)   methotrexate (RHEUMATREX) 2.5 MG tablet 15 mg once a week. Every Monday 5 tablets a day (Patient not taking: Reported on 06/26/2023)   trimethoprim-polymyxin b (POLYTRIM) ophthalmic solution Place 1 drop into the left eye every 6 (six) hours. (Patient not taking: Reported on 06/26/2023)   [DISCONTINUED] terbinafine (LAMISIL) 250 MG tablet Take 1 tablet (250 mg total) by mouth daily.   [DISCONTINUED] terbinafine (LAMISIL) 250 MG tablet Take 1 tablet (250 mg total) by mouth daily.   No facility-administered medications prior to visit.    Review of Systems per HPI      Objective    BP 125/85 (BP Location: Left Arm, Patient Position: Sitting, Cuff Size: Normal)   Pulse (!) 101   Temp 98.1 F (36.7 C) (Oral)   Resp 14   Ht 5\' 7"  (1.702 m)   Wt 145 lb 4.8 oz (65.9 kg)   SpO2 99%   BMI 22.76 kg/m       Physical Exam Vitals reviewed.  Constitutional:      General: She is not in acute distress.    Appearance: Normal appearance. She is well-developed. She is not diaphoretic.  HENT:     Head: Normocephalic and atraumatic.     Right Ear: Tympanic membrane, ear canal and external ear normal.     Left Ear: Tympanic membrane, ear canal and external ear normal.     Nose: Nose normal.     Mouth/Throat:     Mouth: Mucous membranes are moist.     Pharynx: Oropharynx is clear. No  oropharyngeal exudate.  Eyes:     General: No scleral icterus.    Conjunctiva/sclera: Conjunctivae normal.     Pupils: Pupils are equal, round, and reactive to light.  Neck:     Thyroid: No thyromegaly.  Cardiovascular:     Rate and Rhythm: Normal rate and regular rhythm.     Heart sounds: Normal heart sounds. No murmur heard. Pulmonary:     Effort: Pulmonary effort is normal. No respiratory distress.     Breath sounds: Normal breath sounds. No wheezing or rales.  Abdominal:     General: There is no distension.     Palpations: Abdomen is soft.     Tenderness: There is no abdominal tenderness.  Musculoskeletal:        General: No deformity.     Cervical back: Neck supple.     Right lower leg: No edema.     Left lower leg: No edema.  Lymphadenopathy:     Cervical: No cervical adenopathy.  Skin:    General: Skin is warm and dry.  Findings: No rash.  Neurological:     Mental Status: She is alert and oriented to person, place, and time. Mental status is at baseline.     Gait: Gait normal.  Psychiatric:        Mood and Affect: Mood normal.        Behavior: Behavior normal.        Thought Content: Thought content normal.       Last depression screening scores    05/12/2022    3:05 PM 11/17/2021    9:51 AM 05/11/2021    9:41 AM  PHQ 2/9 Scores  PHQ - 2 Score 3 2 2   PHQ- 9 Score 11 13 10    Last fall risk screening    05/12/2022    3:04 PM  Fall Risk   Falls in the past year? 0  Number falls in past yr: 0  Injury with Fall? 0  Risk for fall due to : History of fall(s)  Follow up Falls evaluation completed   Last Audit-C alcohol use screening    05/12/2022    3:04 PM  Alcohol Use Disorder Test (AUDIT)  1. How often do you have a drink containing alcohol? 4  2. How many drinks containing alcohol do you have on a typical day when you are drinking? 0  3. How often do you have six or more drinks on one occasion? 1  AUDIT-C Score 5  4. How often during the last year  have you found that you were not able to stop drinking once you had started? 0  5. How often during the last year have you failed to do what was normally expected from you because of drinking? 0  6. How often during the last year have you needed a first drink in the morning to get yourself going after a heavy drinking session? 0  7. How often during the last year have you had a feeling of guilt of remorse after drinking? 0  8. How often during the last year have you been unable to remember what happened the night before because you had been drinking? 0  9. Have you or someone else been injured as a result of your drinking? 0  10. Has a relative or friend or a doctor or another health worker been concerned about your drinking or suggested you cut down? 0  Alcohol Use Disorder Identification Test Final Score (AUDIT) 5   A score of 3 or more in women, and 4 or more in men indicates increased risk for alcohol abuse, EXCEPT if all of the points are from question 1   No results found for any visits on 06/26/23.  Assessment & Plan    Routine Health Maintenance and Physical Exam  Exercise Activities and Dietary recommendations  Goals   None     Immunization History  Administered Date(s) Administered   Hepatitis A, Adult 10/26/2018   Moderna Sars-Covid-2 Vaccination 06/26/2020   Tdap 05/11/2021    Health Maintenance  Topic Date Due   COVID-19 Vaccine (2 - Moderna risk series) 07/24/2020   PAP SMEAR-Modifier  12/22/2022   MAMMOGRAM  03/09/2023   INFLUENZA VACCINE  07/13/2023   DTaP/Tdap/Td (2 - Td or Tdap) 05/12/2031   Colonoscopy  01/23/2033   Hepatitis C Screening  Completed   HIV Screening  Completed   HPV VACCINES  Aged Out    Discussed health benefits of physical activity, and encouraged her to engage in regular exercise appropriate for her age and  condition.  Problem List Items Addressed This Visit       Respiratory   Asthma   Relevant Medications    fluticasone-salmeterol (ADVAIR DISKUS) 250-50 MCG/ACT AEPB     Endocrine   Hypothyroidism   Relevant Orders   TSH     Musculoskeletal and Integument   Rheumatoid arthritis (HCC)    F/b Rheum - doing well currently        Other   Moderate episode of recurrent major depressive disorder (HCC)    F/b psychiatry No changes to meds      Relevant Medications   traZODone (DESYREL) 50 MG tablet   H/O macrocytic anemia   Relevant Orders   CBC with Differential/Platelet   Other Visit Diagnoses     Encounter for annual physical exam    -  Primary   Relevant Orders   TSH   CBC with Differential/Platelet   Comprehensive metabolic panel   Lipid panel   Breast cancer screening by mammogram       Relevant Orders   MM 3D SCREENING MAMMOGRAM BILATERAL BREAST   Onychomycosis       Relevant Medications   terbinafine (LAMISIL) 250 MG tablet           Asthma: Increased frequency of symptoms and recent severe attack. Discussed the need for a controller inhaler due to daily use of rescue inhaler. -Start Advair, one puff twice a day.  Onychomycosis: Complaint of toenail fungus that is causing discomfort. Has tried topical treatments without success. -Prescribe Terbinafine, to be taken for 12 weeks.  Hyperlipidemia: History of slightly elevated cholesterol levels. Discussed the importance of considering overall cardiovascular risk rather than focusing solely on cholesterol numbers. -Order lipid panel and calculate 10-year risk of heart disease and stroke.  Menopause: Recent weight gain and fatigue possibly related to hormone therapy. Discussed the importance of maintaining physical activity and managing symptoms. -Continue current hormone therapy regimen and monitor symptoms.  General Health Maintenance: -Obtain records of recent Pap smear from [facility]. -Order mammogram to be done at [facility]. -Order routine labs including CBC, CMP, cholesterol, and thyroid. -Schedule  one-year physical. -Declined flu shot and COVID booster due to history of severe facial reaction.        Return in about 1 year (around 06/25/2024) for CPE.     I, Shirlee Latch, MD, have reviewed all documentation for this visit. The documentation on 06/26/23 for the exam, diagnosis, procedures, and orders are all accurate and complete.   , Marzella Schlein, MD, MPH [facility]

## 2023-06-26 NOTE — Assessment & Plan Note (Signed)
F/b Rheum - doing well currently

## 2023-06-26 NOTE — Assessment & Plan Note (Signed)
F/b psychiatry No changes to meds

## 2023-07-08 ENCOUNTER — Telehealth: Payer: Medicaid Other | Admitting: Physician Assistant

## 2023-07-08 DIAGNOSIS — B379 Candidiasis, unspecified: Secondary | ICD-10-CM

## 2023-07-08 DIAGNOSIS — J310 Chronic rhinitis: Secondary | ICD-10-CM

## 2023-07-08 DIAGNOSIS — T3695XA Adverse effect of unspecified systemic antibiotic, initial encounter: Secondary | ICD-10-CM

## 2023-07-08 DIAGNOSIS — J320 Chronic maxillary sinusitis: Secondary | ICD-10-CM | POA: Diagnosis not present

## 2023-07-08 DIAGNOSIS — J342 Deviated nasal septum: Secondary | ICD-10-CM | POA: Diagnosis not present

## 2023-07-08 MED ORDER — FLUCONAZOLE 150 MG PO TABS
150.0000 mg | ORAL_TABLET | ORAL | 0 refills | Status: DC | PRN
Start: 2023-07-08 — End: 2023-10-11

## 2023-07-08 MED ORDER — CEPHALEXIN 500 MG PO CAPS
500.0000 mg | ORAL_CAPSULE | Freq: Two times a day (BID) | ORAL | 0 refills | Status: DC
Start: 2023-07-08 — End: 2023-10-11

## 2023-07-08 NOTE — Progress Notes (Signed)

## 2023-07-31 ENCOUNTER — Inpatient Hospital Stay: Admission: RE | Admit: 2023-07-31 | Payer: Medicaid Other | Source: Ambulatory Visit

## 2023-08-07 DIAGNOSIS — Z862 Personal history of diseases of the blood and blood-forming organs and certain disorders involving the immune mechanism: Secondary | ICD-10-CM | POA: Diagnosis not present

## 2023-08-07 DIAGNOSIS — E039 Hypothyroidism, unspecified: Secondary | ICD-10-CM | POA: Diagnosis not present

## 2023-08-07 DIAGNOSIS — Z0000 Encounter for general adult medical examination without abnormal findings: Secondary | ICD-10-CM | POA: Diagnosis not present

## 2023-08-07 DIAGNOSIS — N951 Menopausal and female climacteric states: Secondary | ICD-10-CM | POA: Diagnosis not present

## 2023-08-08 ENCOUNTER — Telehealth: Payer: Self-pay

## 2023-08-08 DIAGNOSIS — N289 Disorder of kidney and ureter, unspecified: Secondary | ICD-10-CM

## 2023-08-08 LAB — COMPREHENSIVE METABOLIC PANEL
Albumin: 4.1 g/dL (ref 3.9–4.9)
Alkaline Phosphatase: 53 IU/L (ref 44–121)
BUN/Creatinine Ratio: 12 (ref 9–23)
Bilirubin Total: 0.6 mg/dL (ref 0.0–1.2)

## 2023-08-08 LAB — LIPID PANEL
LDL Chol Calc (NIH): 135 mg/dL — ABNORMAL HIGH (ref 0–99)
VLDL Cholesterol Cal: 15 mg/dL (ref 5–40)

## 2023-08-08 LAB — CBC WITH DIFFERENTIAL/PLATELET
Immature Grans (Abs): 0 10*3/uL (ref 0.0–0.1)
Lymphs: 37 %
MCH: 32.9 pg (ref 26.6–33.0)

## 2023-08-08 LAB — TSH: TSH: 2.25 u[IU]/mL (ref 0.450–4.500)

## 2023-08-08 NOTE — Telephone Encounter (Signed)
-----   Message from Shirlee Latch sent at 08/08/2023  8:46 AM EDT ----- Normal/stable labs, except: high cholesterol  The 10-year ASCVD risk score (Arnett DK, et al., 2019) is: 0.8%  (heart disease and stroke) which is low. No need for medications, but recommend diet low in saturated fat and regular exercise - 30 min at least 5 times per week. Kidney function is slightly elevated. Recommend hydrating well and recheck BMP in 1 week.

## 2023-08-15 DIAGNOSIS — N951 Menopausal and female climacteric states: Secondary | ICD-10-CM | POA: Diagnosis not present

## 2023-08-15 DIAGNOSIS — R6882 Decreased libido: Secondary | ICD-10-CM | POA: Diagnosis not present

## 2023-08-15 DIAGNOSIS — R232 Flushing: Secondary | ICD-10-CM | POA: Diagnosis not present

## 2023-08-15 DIAGNOSIS — Z6821 Body mass index (BMI) 21.0-21.9, adult: Secondary | ICD-10-CM | POA: Diagnosis not present

## 2023-08-21 ENCOUNTER — Other Ambulatory Visit: Payer: Self-pay | Admitting: Family Medicine

## 2023-08-21 NOTE — Telephone Encounter (Signed)
Medication Refill - Medication:   Disp Refills Start End   albuterol (VENTOLIN HFA) 108 (90 Base) MCG/ACT inhaler         Has the patient contacted their pharmacy? Yes.     Preferred Pharmacy (with phone number or street name):  [facility], [place] - [address] Phone: [phone]  Fax: [phone]      Has the patient been seen for an appointment in the last year OR does the patient have an upcoming appointment? Yes.    Please assist patient further

## 2023-08-22 MED ORDER — ALBUTEROL SULFATE HFA 108 (90 BASE) MCG/ACT IN AERS: 2.0000 | INHALATION_SPRAY | Freq: Four times a day (QID) | RESPIRATORY_TRACT | 2 refills | Status: DC | PRN

## 2023-08-22 NOTE — Telephone Encounter (Signed)
Requested Prescriptions  Pending Prescriptions Disp Refills   albuterol (VENTOLIN HFA) 108 (90 Base) MCG/ACT inhaler 18 g 2    Sig: Inhale 2 puffs into the lungs every 6 (six) hours as needed for wheezing or shortness of breath.     Pulmonology:  Beta Agonists 2 Passed - 08/21/2023 12:05 PM      Passed - Last BP in normal range    BP Readings from Last 1 Encounters:  06/26/23 125/85         Passed - Last Heart Rate in normal range    Pulse Readings from Last 1 Encounters:  06/26/23 (!) 101         Passed - Valid encounter within last 12 months    Recent Outpatient Visits           1 month ago Encounter for annual physical exam   [facility] [place], Marzella Schlein, MD   1 year ago Encounter for annual physical exam   [place] [facility] [place], Marzella Schlein, MD   1 year ago Adenopathy, cervical   [place] [facility] Alfredia Ferguson, PA-C   2 years ago Rheumatoid arthritis, involving unspecified site, unspecified whether rheumatoid factor present [facility])   [place] [facility] [place], Marzella Schlein, MD   2 years ago Chronic fatigue   [place] [facility] [place], Marzella Schlein, MD

## 2023-08-23 ENCOUNTER — Inpatient Hospital Stay: Admission: RE | Admit: 2023-08-23 | Payer: Medicaid Other | Source: Ambulatory Visit

## 2023-08-23 ENCOUNTER — Telehealth: Payer: Medicaid Other | Admitting: Physician Assistant

## 2023-08-23 DIAGNOSIS — H00011 Hordeolum externum right upper eyelid: Secondary | ICD-10-CM | POA: Diagnosis not present

## 2023-08-23 MED ORDER — ERYTHROMYCIN 5 MG/GM OP OINT: 1.0000 | TOPICAL_OINTMENT | Freq: Three times a day (TID) | OPHTHALMIC | 0 refills | Status: AC

## 2023-08-23 NOTE — Progress Notes (Signed)
  E-Visit for Stye   We are sorry that you are not feeling well. Here is how we plan to help!  Based on what you have shared with me it looks like you have a stye.  A stye is an inflammation of the eyelid.  It is often a red, painful lump near the edge of the eyelid that may look like a boil or a pimple.  A stye develops when an infection occurs at the base of an eyelash.   We have made appropriate suggestions for you based upon your presentation: Your symptoms may indicate an infected stye. Apply warm compresses.   The use of anti-inflammatory and antibiotic eye drops for a week will help resolve this condition.  I have sent in erythromycin ophthalmic to use as directed.  If your symptoms do not improve over the next two to three days you should be seen in your doctor's office.  HOME CARE:  Wash your hands often! Let the stye open on its own. Don't squeeze or open it. Don't rub your eyes. This can irritate your eyes and let in bacteria.  If you need to touch your eyes, wash your hands first. Don't wear eye makeup or contact lenses until the area has healed.  GET HELP RIGHT AWAY IF:  Your symptoms do not improve. You develop blurred or loss of vision. Your symptoms worsen (increased discharge, pain or redness).   Thank you for choosing an e-visit.  Your e-visit answers were reviewed by a board certified advanced clinical practitioner to complete your personal care plan. Depending upon the condition, your plan could have included both over the counter or prescription medications.  Please review your pharmacy choice. Make sure the pharmacy is open so you can pick up prescription now. If there is a problem, you may contact your provider through [employer] and have the prescription routed to another pharmacy.  Your safety is important to [place]. If you have drug allergies check your prescription carefully.   For the next 24 hours you can use MyChart to ask questions about today's visit,  request a non-urgent call back, or ask for a work or school excuse. You will get an email in the next two days asking about your experience. I hope that your e-visit has been valuable and will speed your recovery.

## 2023-08-23 NOTE — Progress Notes (Signed)
I have spent 5 minutes in review of e-visit questionnaire, review and updating patient chart, medical decision making and response to patient.   William Cody Martin, PA-C    

## 2023-09-13 DIAGNOSIS — F4312 Post-traumatic stress disorder, chronic: Secondary | ICD-10-CM | POA: Diagnosis not present

## 2023-09-13 DIAGNOSIS — F419 Anxiety disorder, unspecified: Secondary | ICD-10-CM | POA: Diagnosis not present

## 2023-09-13 DIAGNOSIS — G43101 Migraine with aura, not intractable, with status migrainosus: Secondary | ICD-10-CM | POA: Diagnosis not present

## 2023-09-13 DIAGNOSIS — F902 Attention-deficit hyperactivity disorder, combined type: Secondary | ICD-10-CM | POA: Diagnosis not present

## 2023-09-26 ENCOUNTER — Other Ambulatory Visit: Payer: Self-pay | Admitting: Family Medicine

## 2023-09-26 NOTE — Telephone Encounter (Signed)
Requested medication (s) are due for refill today - yes  Requested medication (s) are on the active medication list -yes  Future visit scheduled -no  Last refill: 06/26/23 #30 3RF  Notes to clinic: off protocol- provider review   Requested Prescriptions  Pending Prescriptions Disp Refills   terbinafine (LAMISIL) 250 MG tablet [Pharmacy Med Name: TERBINAFINE 250MG ] 30 tablet 2    Sig: Take 1 tablet (250 mg total) by mouth daily.     Off-Protocol Failed - 09/26/2023  2:15 PM      Failed - Medication not assigned to a protocol, review manually.      Passed - Valid encounter within last 12 months    Recent Outpatient Visits           3 months ago Encounter for annual physical exam   [facility] [place], Marzella Schlein, MD   1 year ago Encounter for annual physical exam   [place] [facility] [place], Marzella Schlein, MD   1 year ago Adenopathy, cervical   [facility] Alfred[facility] Ferguson, PA-C   2 years ago Rheumatoid arthritis, involving unspecified site, unspecified whether rheumatoid factor present [facility])   [place] [facility] [place], Marzella Schlein, MD   2 years ago Chronic fatigue   [place] [facility] [place], Marzella Schlein, MD                 Requested Prescriptions  Pending Prescriptions Disp Refills   terbinafine (LAMISIL) 250 MG tablet [Pharmacy Med Name: TERBINAFINE 250MG ] 30 tablet 2    Sig: Take 1 tablet (250 mg total) by mouth daily.     Off-Protocol Failed - 09/26/2023  2:15 PM      Failed - Medication not assigned to a protocol, review manually.      Passed - Valid encounter within last 12 months    Recent Outpatient Visits           3 months ago Encounter for annual physical exam   [place] [facility] [place], Marzella Schlein, MD   1 year ago Encounter for annual physical exam   [place], Marzella Schlein,  MD   1 year ago Adenopathy, cervical   [place] [facility] Alfredia Ferguson, PA-C   2 years ago Rheumatoid arthritis, involving unspecified site, unspecified whether rheumatoid factor present [facility])   [place] [facility]ery[facility] Flock, Marzella Schlein, MD   2 years ago Chronic fatigue   [place] [facility] [place], Marzella Schlein, MD

## 2023-10-11 ENCOUNTER — Telehealth: Payer: Medicaid Other | Admitting: Family Medicine

## 2023-10-11 DIAGNOSIS — J320 Chronic maxillary sinusitis: Secondary | ICD-10-CM | POA: Diagnosis not present

## 2023-10-11 DIAGNOSIS — J342 Deviated nasal septum: Secondary | ICD-10-CM

## 2023-10-11 DIAGNOSIS — T3695XA Adverse effect of unspecified systemic antibiotic, initial encounter: Secondary | ICD-10-CM | POA: Diagnosis not present

## 2023-10-11 DIAGNOSIS — J310 Chronic rhinitis: Secondary | ICD-10-CM

## 2023-10-11 DIAGNOSIS — B379 Candidiasis, unspecified: Secondary | ICD-10-CM

## 2023-10-11 MED ORDER — FLUCONAZOLE 150 MG PO TABS
150.0000 mg | ORAL_TABLET | ORAL | 0 refills | Status: DC | PRN
Start: 2023-10-11 — End: 2024-01-18

## 2023-10-11 MED ORDER — DOXYCYCLINE HYCLATE 100 MG PO TABS
100.0000 mg | ORAL_TABLET | Freq: Two times a day (BID) | ORAL | 0 refills | Status: DC
Start: 2023-10-11 — End: 2023-10-11

## 2023-10-11 MED ORDER — CEPHALEXIN 500 MG PO CAPS
500.0000 mg | ORAL_CAPSULE | Freq: Two times a day (BID) | ORAL | 0 refills | Status: DC
Start: 2023-10-11 — End: 2024-01-04

## 2023-10-11 NOTE — Progress Notes (Signed)
E-Visit for Sinus Problems  We are sorry that you are not feeling well.  Here is how we plan to help!  Based on what you have shared with me it looks like you have sinusitis.  Sinusitis is inflammation and infection in the sinus cavities of the head.  Based on your presentation I believe you most likely have Acute Bacterial Sinusitis.  This is an infection caused by bacteria and is treated with antibiotics. I have prescribed Keflex to be taken as directed, I will order you Diflucan as well. You may use an oral decongestant such as Mucinex D or if you have glaucoma or high blood pressure use plain Mucinex. Saline nasal spray help and can safely be used as often as needed for congestion.  If you develop worsening sinus pain, fever or notice severe headache and vision changes, or if symptoms are not better after completion of antibiotic, please schedule an appointment with a health care provider.    Sinus infections are not as easily transmitted as other respiratory infection, however we still recommend that you avoid close contact with loved ones, especially the very young and elderly.  Remember to wash your hands thoroughly throughout the day as this is the number one way to prevent the spread of infection!  Home Care: Only take medications as instructed by your medical team. Complete the entire course of an antibiotic. Do not take these medications with alcohol. A steam or ultrasonic humidifier can help congestion.  You can place a towel over your head and breathe in the steam from hot water coming from a faucet. Avoid close contacts especially the very young and the elderly. Cover your mouth when you cough or sneeze. Always remember to wash your hands.  Get Help Right Away If: You develop worsening fever or sinus pain. You develop a severe head ache or visual changes. Your symptoms persist after you have completed your treatment plan.  Make sure you Understand these instructions. Will watch  your condition. Will get help right away if you are not doing well or get worse.  Thank you for choosing an e-visit.  Your e-visit answers were reviewed by a board certified advanced clinical practitioner to complete your personal care plan. Depending upon the condition, your plan could have included both over the counter or prescription medications.  Please review your pharmacy choice. Make sure the pharmacy is open so you can pick up prescription now. If there is a problem, you may contact your provider through [employer] and have the prescription routed to another pharmacy.  Your safety is important to [place]. If you have drug allergies check your prescription carefully.   For the next 24 hours you can use MyChart to ask questions about today's visit, request a non-urgent call back, or ask for a work or school excuse. You will get an email in the next two days asking about your experience. I hope that your e-visit has been valuable and will speed your recovery.  I provided 5 minutes of non face-to-face time during this encounter for chart review, medication and order placement, as well as and documentation.

## 2023-10-19 ENCOUNTER — Encounter: Payer: Self-pay | Admitting: Family Medicine

## 2023-11-10 DIAGNOSIS — N951 Menopausal and female climacteric states: Secondary | ICD-10-CM | POA: Diagnosis not present

## 2023-11-16 DIAGNOSIS — Z6822 Body mass index (BMI) 22.0-22.9, adult: Secondary | ICD-10-CM | POA: Diagnosis not present

## 2023-11-16 DIAGNOSIS — R6882 Decreased libido: Secondary | ICD-10-CM | POA: Diagnosis not present

## 2023-11-16 DIAGNOSIS — N951 Menopausal and female climacteric states: Secondary | ICD-10-CM | POA: Diagnosis not present

## 2023-11-16 DIAGNOSIS — R232 Flushing: Secondary | ICD-10-CM | POA: Diagnosis not present

## 2023-11-20 DIAGNOSIS — H00011 Hordeolum externum right upper eyelid: Secondary | ICD-10-CM | POA: Diagnosis not present

## 2023-11-20 DIAGNOSIS — J45909 Unspecified asthma, uncomplicated: Secondary | ICD-10-CM | POA: Diagnosis not present

## 2023-11-20 DIAGNOSIS — E039 Hypothyroidism, unspecified: Secondary | ICD-10-CM | POA: Diagnosis not present

## 2023-11-20 DIAGNOSIS — Z880 Allergy status to penicillin: Secondary | ICD-10-CM | POA: Diagnosis not present

## 2023-11-20 DIAGNOSIS — Z882 Allergy status to sulfonamides status: Secondary | ICD-10-CM | POA: Diagnosis not present

## 2023-11-20 DIAGNOSIS — Z7951 Long term (current) use of inhaled steroids: Secondary | ICD-10-CM | POA: Diagnosis not present

## 2023-11-21 DIAGNOSIS — N6489 Other specified disorders of breast: Secondary | ICD-10-CM | POA: Diagnosis not present

## 2023-11-21 DIAGNOSIS — Z1231 Encounter for screening mammogram for malignant neoplasm of breast: Secondary | ICD-10-CM | POA: Diagnosis not present

## 2023-11-21 LAB — HM MAMMOGRAPHY

## 2023-11-30 DIAGNOSIS — N6489 Other specified disorders of breast: Secondary | ICD-10-CM | POA: Diagnosis not present

## 2023-12-28 DIAGNOSIS — F419 Anxiety disorder, unspecified: Secondary | ICD-10-CM | POA: Diagnosis not present

## 2023-12-28 DIAGNOSIS — F902 Attention-deficit hyperactivity disorder, combined type: Secondary | ICD-10-CM | POA: Diagnosis not present

## 2023-12-28 DIAGNOSIS — G43101 Migraine with aura, not intractable, with status migrainosus: Secondary | ICD-10-CM | POA: Diagnosis not present

## 2023-12-28 DIAGNOSIS — F4312 Post-traumatic stress disorder, chronic: Secondary | ICD-10-CM | POA: Diagnosis not present

## 2024-01-01 ENCOUNTER — Other Ambulatory Visit: Payer: Self-pay | Admitting: Family Medicine

## 2024-01-02 ENCOUNTER — Telehealth: Payer: Medicaid Other | Admitting: Family Medicine

## 2024-01-02 NOTE — Telephone Encounter (Signed)
Requested medication (s) are due for refill today - provider review   Requested medication (s) are on the active medication list -yes  Future visit scheduled -yes  Last refill: 09/26/23 #30 2RF  Notes to clinic: non delegated Rx  Requested Prescriptions  Pending Prescriptions Disp Refills   terbinafine (LAMISIL) 250 MG tablet [Pharmacy Med Name: TERBINAFINE HCL 250 MG TABLET] 30 tablet 1    Sig: TAKE 1 TABLET (250 MG TOTAL) BY MOUTH DAILY.     Off-Protocol Failed - 01/02/2024  8:50 AM      Failed - Medication not assigned to a protocol, review manually.      Passed - Valid encounter within last 12 months    Recent Outpatient Visits           6 months ago Encounter for annual physical exam   [place], Marzella Schlein, MD   1 year ago Encounter for annual physical exam   [place] [facility] [place], Marzella Schlein, MD   2 years ago Adenopathy, cervical   [place] [facility] Alfredia Ferguson, PA-C   2 years ago Rheumatoid arthritis, involving unspecified site, unspecified whether rheumatoid factor present [facility])   [place] [facility] [place], Marzella Schlein, MD   2 years ago Chronic fatigue   [place] [facility] [place], Marzella Schlein, MD       Future Appointments             In 2 days Bacigal[place], Marzella Schlein, MD [facility]               Requested Prescriptions  Pending Prescriptions Disp Refills   terbinafine (LAMISIL) 250 MG tablet [Pharmacy Med Name: TERBINAFINE HCL 250 MG TABLET] 30 tablet 1    Sig: TAKE 1 TABLET (250 MG TOTAL) BY MOUTH DAILY.     Off-Protocol Failed - 01/02/2024  8:50 AM      Failed - Medication not assigned to a protocol, review manually.      Passed - Valid encounter within last 12 months    Recent Outpatient Visits           6 months ago Encounter for annual physical exam   [place] [facility] [place], Marzella Schlein, MD   1 year ago Encounter for annual physical exam   [place] [facility] [place], Marzella Schlein, MD   2 years ago Adenopathy, cervical   [place] [facility] Alfredia Ferguson, PA-C   2 years ago Rheumatoid arthritis, involving unspecified site, unspecified whether rheumatoid factor present [facility])   [place] [facility], Marzella Schlein, MD   2 years ago Chronic fatigue   [place] [facility] [place], Marzella Schlein, MD       Future Appointments             In 2 days Bacigalupo, Marzella Schlein, MD [facility]

## 2024-01-04 ENCOUNTER — Encounter: Payer: Self-pay | Admitting: Family Medicine

## 2024-01-04 ENCOUNTER — Telehealth: Payer: Medicaid Other | Admitting: Family Medicine

## 2024-01-04 DIAGNOSIS — E782 Mixed hyperlipidemia: Secondary | ICD-10-CM | POA: Insufficient documentation

## 2024-01-04 HISTORY — DX: Mixed hyperlipidemia: E78.2

## 2024-01-04 MED ORDER — LISDEXAMFETAMINE DIMESYLATE 70 MG PO CAPS: 70.0000 mg | ORAL_CAPSULE | Freq: Every day | ORAL | Status: DC

## 2024-01-04 NOTE — Progress Notes (Signed)
MyChart Video Visit    Virtual Visit via Video Note   This format is felt to be most appropriate for this patient at this time. Physical exam was limited by quality of the video and audio technology used for the visit.    Patient location: home Provider location: [facility] Persons involved in the visit: patient, provider  I discussed the limitations of evaluation and management by telemedicine and the availability of in person appointments. The patient expressed understanding and agreed to proceed.  Patient: [name]   DOB: 1976-07-22   48 y.o. Female  MRN: [id number] Visit Date: 01/04/2024  Today's healthcare provider: Shirlee Latch, MD   No chief complaint on file.  Subjective    HPI   Discussed the use of AI scribe software for clinical note transcription with the patient, who gave verbal consent to proceed.  History of Present Illness   [name], a 48 year old patient with a history of migraines and perimenopause, presents with concerns about her elevated cholesterol levels. She reports that her cholesterol levels have been high since she started perimenopause and despite being on hormone replacement therapy, her cholesterol remains elevated. She is worried about the potential impact on her heart health. She maintains a healthy lifestyle, including regular exercise and a clean diet, but has noticed a decrease in her activity level due to her busy work schedule and other commitments. She also reports experiencing migraines, which have increased since the onset of perimenopause. She is currently on a complex medication regimen, which has undergone several changes recently.        The 10-year ASCVD risk score (Arnett DK, et al., 2019) is: 0.9%    Review of Systems      Objective    There were no vitals taken for this visit.      Physical Exam Constitutional:      General: She is not in acute distress.    Appearance: Normal  appearance.  HENT:     Head: Normocephalic.  Pulmonary:     Effort: Pulmonary effort is normal. No respiratory distress.  Neurological:     Mental Status: She is alert and oriented to person, place, and time. Mental status is at baseline.        Assessment & Plan     Problem List Items Addressed This Visit       Other   Moderate mixed hyperlipidemia not requiring statin therapy - Primary        Hyperlipidemia Mildly elevated cholesterol levels since perimenopause. Currently on hormone replacement therapy (transdermal estradiol patch and oral progesterone). No significant cardiovascular risk factors (e.g., no diabetes, normotensive). ASCVD risk <1% over the next ten years. Current benefit of statin initiation does not outweigh potential side effects. Emphasized continued monitoring and lifestyle modifications. Discussed cholesterol as one marker of heart disease risk and potential future use of cardiac CTs for further risk stratification. - Continue current hormone replacement therapy - Monitor cholesterol levels annually - Reassess ASCVD risk annually - Encourage regular physical activity and healthy diet  Medication Management Reviewed and updated current medication list. Discontinued unnecessary medications. Confirmed current dosages and usage of active medications. - Update medication list in the chart - Discontinue Keflex, B12, Inspra, Estrace pill, Advair, methotrexate, Elidel cream - Continue trazodone 75 mg nightly - Continue albuterol nebs and inhaler PRN - Continue Wellbutrin XL 300 mg daily - Continue clonazepam 0.5 mg PRN - Continue estradiol patch 0.025 mg twice a week - Continue Lasix  20 mg PRN - Continue sumatriptan and naratriptan for migraines - Continue Linzess 145 mcg daily - Update Vyvanse to 70 mg daily - Continue meloxicam 15 mg daily - Continue Robaxin PRN - Continue mometasone cream PRN - Continue birth control pill - Continue progesterone 100 mg  nightly - Continue Lamisil for toenail fungus - Add Zoloft 25-50 mg daily - Add NP thyroid 15 mg daily  General Health Maintenance Emphasized the importance of regular physical activity, healthy diet, self-care, and stress management. - Encourage regular physical activity - Encourage healthy diet - Practice self-care and set boundaries to manage stress  Follow-up - Schedule physical examination for late July or early August.        Meds ordered this encounter  Medications   lisdexamfetamine (VYVANSE) 70 MG capsule    Sig: Take 1 capsule (70 mg total) by mouth daily.    Do not fill <30 days from last refill     Return in about 6 months (around 07/03/2024) for CPE.     I discussed the assessment and treatment plan with the patient. The patient was provided an opportunity to ask questions and all were answered. The patient agreed with the plan and demonstrated an understanding of the instructions.   The patient was advised to call back or seek an in-person evaluation if the symptoms worsen or if the condition fails to improve as anticipated.   Total time spent on today's visit was greater than 30 minutes, including both face-to-face  (virtually) time and nonface-to-face time personally spent on review of chart, discussing labs and goals, treatment options, answering patient's questions, and coordinating care.   Shirlee Latch, MD [facility] [phone] (phone) [phone] (fax)  [facility]

## 2024-01-05 DIAGNOSIS — M79642 Pain in left hand: Secondary | ICD-10-CM | POA: Diagnosis not present

## 2024-01-08 DIAGNOSIS — M189 Osteoarthritis of first carpometacarpal joint, unspecified: Secondary | ICD-10-CM | POA: Diagnosis not present

## 2024-01-08 DIAGNOSIS — M1812 Unilateral primary osteoarthritis of first carpometacarpal joint, left hand: Secondary | ICD-10-CM | POA: Diagnosis not present

## 2024-01-18 ENCOUNTER — Telehealth: Payer: Medicaid Other | Admitting: Physician Assistant

## 2024-01-18 DIAGNOSIS — J019 Acute sinusitis, unspecified: Secondary | ICD-10-CM

## 2024-01-18 DIAGNOSIS — B9689 Other specified bacterial agents as the cause of diseases classified elsewhere: Secondary | ICD-10-CM

## 2024-01-18 MED ORDER — SULFAMETHOXAZOLE-TRIMETHOPRIM 800-160 MG PO TABS
1.0000 | ORAL_TABLET | Freq: Two times a day (BID) | ORAL | 0 refills | Status: DC
Start: 2024-01-18 — End: 2024-04-11

## 2024-01-18 MED ORDER — FLUCONAZOLE 150 MG PO TABS
ORAL_TABLET | ORAL | 0 refills | Status: DC
Start: 2024-01-18 — End: 2024-04-11

## 2024-01-18 NOTE — Progress Notes (Signed)
 E-Visit for Sinus Problems  We are sorry that you are not feeling well.  Here is how we plan to help!  Based on what you have shared with me it looks like you have sinusitis.  Sinusitis is inflammation and infection in the sinus cavities of the head.  Based on your presentation I believe you most likely have Acute Bacterial Sinusitis.  This is an infection caused by bacteria and is treated with antibiotics. I have prescribed Bactrim  to take twice daily as directed. I have also sent in Diflucan  in case of yeast from antibiotic use.    You may use an oral decongestant such as Mucinex D or if you have glaucoma or high blood pressure use plain Mucinex. Saline nasal spray help and can safely be used as often as needed for congestion.  If you develop worsening sinus pain, fever or notice severe headache and vision changes, or if symptoms are not better after completion of antibiotic, please schedule an appointment with a health care provider.    Sinus infections are not as easily transmitted as other respiratory infection, however we still recommend that you avoid close contact with loved ones, especially the very young and elderly.  Remember to wash your hands thoroughly throughout the day as this is the number one way to prevent the spread of infection!  Home Care: Only take medications as instructed by your medical team. Complete the entire course of an antibiotic. Do not take these medications with alcohol. A steam or ultrasonic humidifier can help congestion.  You can place a towel over your head and breathe in the steam from hot water coming from a faucet. Avoid close contacts especially the very young and the elderly. Cover your mouth when you cough or sneeze. Always remember to wash your hands.  Get Help Right Away If: You develop worsening fever or sinus pain. You develop a severe head ache or visual changes. Your symptoms persist after you have completed your treatment plan.  Make sure  you Understand these instructions. Will watch your condition. Will get help right away if you are not doing well or get worse.  Thank you for choosing an e-visit.  Your e-visit answers were reviewed by a board certified advanced clinical practitioner to complete your personal care plan. Depending upon the condition, your plan could have included both over the counter or prescription medications.  Please review your pharmacy choice. Make sure the pharmacy is open so you can pick up prescription now. If there is a problem, you may contact your provider through [employer] and have the prescription routed to another pharmacy.  Your safety is important to us . If you have drug allergies check your prescription carefully.   For the next 24 hours you can use MyChart to ask questions about today's visit, request a non-urgent call back, or ask for a work or school excuse. You will get an email in the next two days asking about your experience. I hope that your e-visit has been valuable and will speed your recovery.

## 2024-01-18 NOTE — Progress Notes (Signed)
 I have spent 5 minutes in review of e-visit questionnaire, review and updating patient chart, medical decision making and response to patient.   Piedad Climes, PA-C

## 2024-01-18 NOTE — Progress Notes (Signed)
 Message sent to patient requesting further input regarding current symptoms. Awaiting patient response.

## 2024-01-25 ENCOUNTER — Other Ambulatory Visit: Payer: Self-pay | Admitting: Family Medicine

## 2024-01-26 NOTE — Telephone Encounter (Signed)
Requested medication (s) are due for refill today: yes  Requested medication (s) are on the active medication list: yes  Last refill:  06/26/23 #90/0  Future visit scheduled: no  Notes to clinic:  Unable to refill per protocol due to failed labs, no updated results.      Requested Prescriptions  Pending Prescriptions Disp Refills   furosemide (LASIX) 20 MG tablet [Pharmacy Med Name: FUROSEMIDE 20 MG TABLET] 90 tablet 0    Sig: TAKE 1 TABLET BY MOUTH ONCE DAILY AS NEEDED FOR SWELLING     Cardiovascular:  Diuretics - Loop Failed - 01/26/2024 10:13 AM      Failed - K in normal range and within 180 days    Potassium  Date Value Ref Range Status  08/07/2023 3.4 (L) 3.5 - 5.2 mmol/L Final         Failed - Cr in normal range and within 180 days    Creat  Date Value Ref Range Status  04/10/2018 0.99 0.50 - 1.10 mg/dL Final   Creatinine, Ser  Date Value Ref Range Status  08/07/2023 1.23 (H) 0.57 - 1.00 mg/dL Final         Failed - Mg Level in normal range and within 180 days    No results found for: "MG"       Passed - Ca in normal range and within 180 days    Calcium  Date Value Ref Range Status  08/07/2023 8.8 8.7 - 10.2 mg/dL Final         Passed - Na in normal range and within 180 days    Sodium  Date Value Ref Range Status  08/07/2023 136 134 - 144 mmol/L Final         Passed - Cl in normal range and within 180 days    Chloride  Date Value Ref Range Status  08/07/2023 97 96 - 106 mmol/L Final         Passed - Last BP in normal range    BP Readings from Last 1 Encounters:  06/26/23 125/85         Passed - Valid encounter within last 6 months    Recent Outpatient Visits           3 weeks ago Moderate mixed hyperlipidemia not requiring statin therapy   [facility], Marzella Schlein, MD   7 months ago Encounter for annual physical exam   [place] [facility] [place], Marzella Schlein, MD   1 year ago Encounter for  annual physical exam   [place] [facility] [place], Marzella Schlein, MD   2 years ago Adenopathy, cervical   [place] [facility] Alfred[facility] Ferguson, PA-C   2 years ago Rheumatoid arthritis, involving unspecified site, unspecified whether rheumatoid factor present [facility])   [facility] [place], Marzella Schlein, MD

## 2024-03-18 DIAGNOSIS — Z01419 Encounter for gynecological examination (general) (routine) without abnormal findings: Secondary | ICD-10-CM | POA: Diagnosis not present

## 2024-03-18 DIAGNOSIS — N959 Unspecified menopausal and perimenopausal disorder: Secondary | ICD-10-CM | POA: Diagnosis not present

## 2024-03-18 DIAGNOSIS — N951 Menopausal and female climacteric states: Secondary | ICD-10-CM | POA: Diagnosis not present

## 2024-03-26 DIAGNOSIS — F419 Anxiety disorder, unspecified: Secondary | ICD-10-CM | POA: Diagnosis not present

## 2024-03-26 DIAGNOSIS — F4312 Post-traumatic stress disorder, chronic: Secondary | ICD-10-CM | POA: Diagnosis not present

## 2024-03-26 DIAGNOSIS — F902 Attention-deficit hyperactivity disorder, combined type: Secondary | ICD-10-CM | POA: Diagnosis not present

## 2024-04-11 ENCOUNTER — Telehealth: Admitting: Physician Assistant

## 2024-04-11 DIAGNOSIS — J019 Acute sinusitis, unspecified: Secondary | ICD-10-CM

## 2024-04-11 DIAGNOSIS — B9689 Other specified bacterial agents as the cause of diseases classified elsewhere: Secondary | ICD-10-CM | POA: Diagnosis not present

## 2024-04-11 MED ORDER — DOXYCYCLINE HYCLATE 100 MG PO TABS: 100.0000 mg | ORAL_TABLET | Freq: Two times a day (BID) | ORAL | 0 refills | Status: DC

## 2024-04-11 MED ORDER — FLUCONAZOLE 150 MG PO TABS: 150.0000 mg | ORAL_TABLET | ORAL | 0 refills | Status: DC | PRN

## 2024-04-11 MED ORDER — AZITHROMYCIN 250 MG PO TABS: ORAL_TABLET | ORAL | 0 refills | Status: AC

## 2024-04-11 MED ORDER — IPRATROPIUM BROMIDE 0.03 % NA SOLN: 2.0000 | Freq: Two times a day (BID) | NASAL | 0 refills | Status: DC

## 2024-04-11 NOTE — Progress Notes (Signed)
 E-Visit for Sinus Problems  We are sorry that you are not feeling well.  Here is how we plan to help!  Based on what you have shared with me it looks like you have sinusitis.  Sinusitis is inflammation and infection in the sinus cavities of the head.  Based on your presentation I believe you most likely have Acute Bacterial Sinusitis.  This is an infection caused by bacteria and is treated with antibiotics. I have prescribed Doxycycline  100mg  by mouth twice a day for 10 days. You may use an oral decongestant such as Mucinex D or if you have glaucoma or high blood pressure use plain Mucinex. Saline nasal spray help and can safely be used as often as needed for congestion I have added on a nasal sterois spray, atrovent , to use as directed as well.  If you develop worsening sinus pain, fever or notice severe headache and vision changes, or if symptoms are not better after completion of antibiotic, please schedule an appointment with a health care provider.    Sinus infections are not as easily transmitted as other respiratory infection, however we still recommend that you avoid close contact with loved ones, especially the very young and elderly.  Remember to wash your hands thoroughly throughout the day as this is the number one way to prevent the spread of infection!  Home Care: Only take medications as instructed by your medical team. Complete the entire course of an antibiotic. Do not take these medications with alcohol. A steam or ultrasonic humidifier can help congestion.  You can place a towel over your head and breathe in the steam from hot water coming from a faucet. Avoid close contacts especially the very young and the elderly. Cover your mouth when you cough or sneeze. Always remember to wash your hands.  Get Help Right Away If: You develop worsening fever or sinus pain. You develop a severe head ache or visual changes. Your symptoms persist after you have completed your treatment  plan.  Make sure you Understand these instructions. Will watch your condition. Will get help right away if you are not doing well or get worse.  Thank you for choosing an e-visit.  Your e-visit answers were reviewed by a board certified advanced clinical practitioner to complete your personal care plan. Depending upon the condition, your plan could have included both over the counter or prescription medications.  Please review your pharmacy choice. Make sure the pharmacy is open so you can pick up prescription now. If there is a problem, you may contact your provider through [employer] and have the prescription routed to another pharmacy.  Your safety is important to us . If you have drug allergies check your prescription carefully.   For the next 24 hours you can use MyChart to ask questions about today's visit, request a non-urgent call back, or ask for a work or school excuse. You will get an email in the next two days asking about your experience. I hope that your e-visit has been valuable and will speed your recovery.

## 2024-04-11 NOTE — Progress Notes (Signed)
 I have spent 5 minutes in review of e-visit questionnaire, review and updating patient chart, medical decision making and response to patient.   Piedad Climes, PA-C

## 2024-04-15 ENCOUNTER — Telehealth: Payer: Self-pay | Admitting: Family Medicine

## 2024-04-15 NOTE — Telephone Encounter (Signed)
 Pt wants an appt to scheduled.[name] can schedule with any PCP, except Dr. Shann Darnel.per  patient.

## 2024-05-06 ENCOUNTER — Encounter: Payer: Self-pay | Admitting: Family Medicine

## 2024-05-07 ENCOUNTER — Other Ambulatory Visit: Payer: Self-pay | Admitting: Family Medicine

## 2024-05-07 MED ORDER — SCOPOLAMINE 1 MG/3DAYS TD PT72: 1.0000 | MEDICATED_PATCH | TRANSDERMAL | 0 refills | Status: DC

## 2024-05-07 NOTE — Telephone Encounter (Signed)
 Please see the pt Rx request below

## 2024-05-30 ENCOUNTER — Telehealth: Admitting: Family Medicine

## 2024-05-30 DIAGNOSIS — S90414A Abrasion, right lesser toe(s), initial encounter: Secondary | ICD-10-CM

## 2024-05-30 DIAGNOSIS — L089 Local infection of the skin and subcutaneous tissue, unspecified: Secondary | ICD-10-CM | POA: Diagnosis not present

## 2024-05-30 DIAGNOSIS — T148XXA Other injury of unspecified body region, initial encounter: Secondary | ICD-10-CM

## 2024-05-30 MED ORDER — FLUCONAZOLE 150 MG PO TABS: 150.0000 mg | ORAL_TABLET | ORAL | 0 refills | Status: DC | PRN

## 2024-05-30 MED ORDER — CEPHALEXIN 500 MG PO CAPS: 500.0000 mg | ORAL_CAPSULE | Freq: Three times a day (TID) | ORAL | 0 refills | Status: AC

## 2024-05-30 NOTE — Progress Notes (Signed)
 Virtual Visit Consent   Icie Kuznicki Trager, you are scheduled for a virtual visit with a [facility] provider today. Just as with appointments in the office, your consent must be obtained to participate. Your consent will be active for this visit and any virtual visit you may have with one of our providers in the next 365 days. If you have a MyChart account, a copy of this consent can be sent to you electronically.  As this is a virtual visit, video technology does not allow for your provider to perform a traditional examination. This may limit your provider's ability to fully assess your condition. If your provider identifies any concerns that need to be evaluated in person or the need to arrange testing (such as labs, EKG, etc.), we will make arrangements to do so. Although advances in technology are sophisticated, we cannot ensure that it will always work on either your end or our end. If the connection with a video visit is poor, the visit may have to be switched to a telephone visit. With either a video or telephone visit, we are not always able to ensure that we have a secure connection.  By engaging in this virtual visit, you consent to the provision of healthcare and authorize for your insurance to be billed (if applicable) for the services provided during this visit. Depending on your insurance coverage, you may receive a charge related to this service.  I need to obtain your verbal consent now. Are you willing to proceed with your visit today? Annaleigha Woo White has provided verbal consent on 05/30/2024 for a virtual visit (video or telephone). Albertha Huger, FNP  Date: 05/30/2024 7:37 PM   Virtual Visit via Video Note   I, Albertha Huger, connected with  ZAMARIAH SEABORN  ([id number], 02-19-1976) on 05/30/24 at  7:30 PM EDT by a video-enabled telemedicine application and verified that I am speaking with the correct person using two identifiers.  Location: Patient: Virtual Visit Location Patient:  Home Provider: Virtual Visit Location Provider: Home Office   I discussed the limitations of evaluation and management by telemedicine and the availability of in person appointments. The patient expressed understanding and agreed to proceed.    History of Present Illness: DONIQUE HAMMONDS is a 48 y.o. who identifies as a female who was assigned female at birth, and is being seen today for an infection in the rt second toe from an abrasion from a bed slat when moving bed. No fever. It has become more red and is draining yellow. No fever. 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   Conductive hearing loss of left ear with unrestricted hearing of right ear 06/15/2021   Tinnitus of both ears 06/15/2021   Chronic fatigue 05/11/2021   Chronic gout of right hand 05/11/2021   Left elbow pain 11/30/2020   Cervical disc disorder with myelopathy of mid-cervical region 07/09/2020   Cervical radiculitis 05/13/2020   Sprain of interphalangeal joint of finger 02/19/2020   Chronic constipation 12/31/2019   Deviated nasal septum 09/24/2019   Obstruction of nasal valve 09/24/2019   Chronic rhinitis 09/24/2019   GAD (generalized anxiety disorder) 03/15/2019   Moderate episode of recurrent major depressive disorder (HCC) 03/15/2019   Rheumatoid arthritis (HCC) 11/01/2018  Primary osteoarthritis of both hands 05/11/2018   Primary osteoarthritis of both feet 05/11/2018   Raynaud's disease without gangrene 05/11/2018   Asthma 04/20/2018   Family history of rheumatoid arthritis 04/10/2018   Dry mouth 01/23/2018   PTSD (post-traumatic stress disorder) 10/16/2017   Insomnia 10/16/2017   Migraines 10/16/2017   ADHD (attention deficit hyperactivity  disorder)    Uterine leiomyoma 09/22/2017   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   Hypothyroidism 03/30/2017   Cervical spondylosis with myelopathy 06/12/2015    Allergies:  Allergies  Allergen Reactions   Penicillins Swelling    Pt states caused throat to swell    Doxycycline  Nausea And Vomiting   Sulfa  Antibiotics Other (See Comments)    Lips tingled  Lips tingled. Has tolerated since   Medications:  Current Outpatient Medications:    cephALEXin  (KEFLEX ) 500 MG capsule, Take 1 capsule (500 mg total) by mouth 3 (three) times daily for 7 days., Disp: 21 capsule, Rfl: 0   albuterol  (ACCUNEB ) 0.63 MG/3ML nebulizer solution, Take 1 ampule by nebulization every 6 (six) hours as needed for wheezing., Disp: , Rfl:    buPROPion  (WELLBUTRIN  XL) 300 MG 24 hr tablet, Take by mouth., Disp: , Rfl:    clonazePAM  (KLONOPIN ) 0.5 MG tablet, Take 0.5 mg by mouth daily as needed., Disp: , Rfl:    estradiol  (CLIMARA  - DOSED IN MG/24 HR) 0.025 mg/24hr patch, Place 0.025 mg onto the skin once a week., Disp: , Rfl:    fluconazole  (DIFLUCAN ) 150 MG tablet, Take 1 tablet (150 mg total) by mouth every 3 (three) days as needed for up to 2 doses., Disp: 2 tablet, Rfl: 0   furosemide  (LASIX ) 20 MG tablet, TAKE 1 TABLET BY MOUTH ONCE DAILY AS NEEDED FOR SWELLING, Disp: 90 tablet, Rfl: 1   ipratropium (ATROVENT ) 0.03 % nasal spray, Place 2 sprays into both nostrils every 12 (twelve) hours., Disp: 30 mL, Rfl: 0   LINZESS 145 MCG CAPS capsule, Take 145 mcg by mouth daily., Disp: , Rfl:    lisdexamfetamine (VYVANSE ) 70 MG capsule, Take 1 capsule (70 mg total) by mouth daily., Disp: , Rfl:    meloxicam  (MOBIC ) 15 MG tablet, TAKE 1 TABLET BY MOUTH EVERY DAY WITH THE LARGEST MEAL, Disp: 90 tablet, Rfl: 0   methocarbamol (ROBAXIN) 500 MG tablet, Take by mouth as needed., Disp: , Rfl:    mometasone  (ELOCON ) 0.1 % cream, Apply topically., Disp: , Rfl:    naratriptan  (AMERGE) 2.5 MG tablet, Take 1 tablet (2.5 mg total) by  mouth as needed for migraine. Take one (1) tablet at onset of headache; if returns or does not resolve, may repeat after 4 hours; do not exceed five (5) mg in 24 hours., Disp: 10 tablet, Rfl: 5   norethindrone -ethinyl estradiol -iron (LOESTRIN FE) 1.5-30 MG-MCG tablet, Take 1 tablet by mouth daily., Disp: , Rfl:    progesterone  (PROMETRIUM ) 100 MG capsule, Take 100 mg by mouth at bedtime., Disp: , Rfl:    scopolamine  (TRANSDERM-SCOP) 1 MG/3DAYS, Place 1 patch (1.5 mg total) onto the skin every 3 (three) days., Disp: 5 patch, Rfl: 0   sertraline (ZOLOFT) 50 MG tablet, Take 25 mg by mouth daily., Disp: , Rfl:    SUMAtriptan  (IMITREX ) 100 MG tablet, TK 1 T PO AOS OF HEADACHE. REPEAT IN 2 H AS NEEDED. MAX DOSE 2 T IN 24 H, Disp: 10 tablet, Rfl: 5   terbinafine  (LAMISIL ) 250 MG tablet, TAKE 1 TABLET (250 MG TOTAL) BY MOUTH DAILY., Disp: 30  tablet, Rfl: 0   thyroid  (ARMOUR) 30 MG tablet, Take 15 mg by mouth daily before breakfast., Disp: , Rfl:    traZODone  (DESYREL ) 50 MG tablet, Take 75 mg by mouth at bedtime., Disp: , Rfl:    VENTOLIN  HFA 108 (90 Base) MCG/ACT inhaler, INHALE 2 PUFFS INTO THE LUNGS EVERY 6 (SIX) HOURS AS NEEDED FOR WHEEZING OR SHORTNESS OF BREATH., Disp: 18 g, Rfl: 1  Observations/Objective: Patient is well-developed, well-nourished in no acute distress.  Resting comfortably  at home.  Head is normocephalic, atraumatic.  No labored breathing.  Speech is clear and coherent with logical content.  Patient is alert and oriented at baseline.    Assessment and Plan: There are no diagnoses linked to this encounter. Keep toe clean with peroxide or warm soapy water, apply bacitracin, UC if appearance worsens.  Follow Up Instructions: I discussed the assessment and treatment plan with the patient. The patient was provided an opportunity to ask questions and all were answered. The patient agreed with the plan and demonstrated an understanding of the instructions.  A copy of instructions  were sent to the patient via MyChart unless otherwise noted below.     The patient was advised to call back or seek an in-person evaluation if the symptoms worsen or if the condition fails to improve as anticipated.    Jamarquis Crull, FNP

## 2024-05-30 NOTE — Patient Instructions (Signed)
Wound Infection A wound infection happens when germs start to grow in a wound. Infection can cause the wound to break open or get worse. Wound infections need treatment. If a wound infection is not treated, serious problems can happen. These problems could include getting an infection in your blood (septicemia) or bones (osteomyelitis). What are the causes? A wound infection is most often caused by bacteria growing in a wound. Other germs, like yeast and fungi, can also cause wound infections. What increases the risk? The following factors may make you more likely to develop a wound infection: A weak body defense system (immune system). Diabetes. Taking steroid medicines for a long time (chronic use). Smoking. Being an older adult. Obesity. Taking chemotherapy medicines. Poor nutrition. What are the signs or symptoms? Symptoms of a wound infection include: More redness, swelling, or pain at the wound site. More blood or fluid at the wound site. A bad smell coming from a wound or bandage (dressing). Fever. Feeling tired (fatigued). Warmth at or around the wound. Pus at the wound site. How is this diagnosed? A wound infection is diagnosed based on your symptoms, medical history, and physical exam. You may also have a wound culture, blood tests, or both. How is this treated? This condition is usually treated with antibiotics and medicines that lower inflammation. The infection should get better in 24-48 hours after starting antibiotics. After 24-48 hours, redness around the wound should stop spreading. The wound should also be less painful. If the condition is severe, you may need to stay at the hospital and get antibiotics through an IV. Follow these instructions at home: Medicines Take or apply over-the-counter and prescription medicines only as told by your health care provider. If you were prescribed antibiotics, take or apply them as told by your provider. Do not stop using the  antibiotic even if you start to feel better. Wound care  Clean the wound each day or as told by your provider. Wash the wound with mild soap and water. Rinse the wound with water to remove all soap. Pat the wound dry with a clean towel. Do not rub it. Follow instructions from your provider about how to take care of your wound. Make sure you: Wash your hands with soap and water for at least 20 seconds before and after you change your dressing. If soap and water are not available, use hand sanitizer. Change your dressing as told by your provider. Leave stitches (sutures), skin glue, or tape strips in place. These skin closures may need to stay in place for 2 weeks or longer. If tape strip edges start to loosen and curl up, you may trim the loose edges. Do not remove tape strips completely unless your provider tells you to do that. Check your wound every day for signs of infection. Check for: More redness, swelling, or pain. More fluid or blood. Warmth. Pus or a bad smell. General instructions Drink enough fluid to keep your pee (urine) pale yellow. Do not take baths, swim, or use a hot tub until your provider approves. Ask your provider if you may take showers. You may only be allowed to take sponge baths. Raise (elevate) the wound area above the level of your heart while you are sitting or lying down. Do not scratch or pick at the wound. Keep all follow-up visits. These visits help your provider make sure a more serious infection is not developing. Contact a health care provider if: Your infection does not get better in 24-48 hours.  You have signs of infection. You have a fever. Your wound gets larger, turns dark in color, or becomes more painful. You feel generally sick (malaise) with muscle aches and weakness. Your symptoms get worse. You have vomiting or diarrhea that does not stop. Get help right away if: Your wound that was closed breaks open. You see red streaks coming from the  infected area. This information is not intended to replace advice given to you by your health care provider. Make sure you discuss any questions you have with your health care provider. Document Revised: 09/01/2022 Document Reviewed: 09/01/2022 Elsevier Patient Education  2024 [employer].

## 2024-06-23 ENCOUNTER — Telehealth: Admitting: Family

## 2024-06-23 DIAGNOSIS — L255 Unspecified contact dermatitis due to plants, except food: Secondary | ICD-10-CM

## 2024-06-23 MED ORDER — TRIAMCINOLONE ACETONIDE 0.1 % EX CREA: 1.0000 | TOPICAL_CREAM | Freq: Two times a day (BID) | CUTANEOUS | 0 refills | Status: DC

## 2024-06-23 MED ORDER — PREDNISONE 10 MG (21) PO TBPK: ORAL_TABLET | ORAL | 0 refills | Status: DC

## 2024-06-23 NOTE — Progress Notes (Signed)
 E-Visit for [employer]  We are sorry that you are not feeing well.  Here is how we plan to help!  Based on what you have shared with me it looks like you have had an allergic reaction to the oily resin from a group of plants.  This resin is very sticky, so it easily attaches to your skin, clothing, tools equipment, and pet's fur.    This blistering rash is often called poison ivy rash although it can come from contact with the leaves, stems and roots of poison ivy, poison oak and poison sumac.  The oily resin contains urushiol (u-ROO-she-ol) that produces a skin rash on exposed skin.  The severity of the rash depends on the amount of urushiol that gets on your skin.  A section of skin with more urushiol on it may develop a rash sooner.  The rash usually develops 12-48 hours after exposure and can last two to three weeks.  Your skin must come in direct contact with the plant's oil to be affected.  Blister fluid doesn't spread the rash.  However, if you come into contact with a piece of clothing or pet fur that has urushiol on it, the rash may spread out.  You can also transfer the oil to other parts of your body with your fingers.  Often the rash looks like a straight line because of the way the plant brushes against your skin.  Since your rash is widespread or has resulted in a large number of blisters, I have prescribed an oral corticosteroid.  Please follow these recommendations:  I have sent a prednisone  dose pack to your chosen pharmacy. Be sure to follow the instructions carefully and complete the entire prescription. You may use Benadryl or Caladryl topical lotions to sooth the itch and remember cool, not hot, showers and baths can help relieve the itching!  Place cool, wet compresses on the affected area for 15-30 minutes several times a day.  You may also take oral antihistamines, such as diphenhydramine (Benadryl, others), which may also help you sleep better.  Watch your skin for any purulent  (pus) drainage or red streaking from the site.  If this occurs, contact your provider.  You may require an antibiotic for a skin infection.  Make sure that the clothes you were wearing as well as any towels or sheets that may have come in contact with the oil (urushiol) are washed in detergent and hot water.       I have developed the following plan to treat your condition I am prescribing a course of steroids  and I am prescribing triamcinolone  0.1 % cream -- apply to the affected area(s) in a thin layer, twice daily for up to 14 days. Do not apply to face, privates or armpit regions.    What can you do to prevent this rash?  Avoid the plants.  Learn how to identify poison ivy, poison oak and poison sumac in all seasons.  When hiking or engaging in other activities that might expose you to these plants, try to stay on cleared pathways.  If camping, make sure you pitch your tent in an area free of these plants.  Keep pets from running through wooded areas so that urushiol doesn't accidentally stick to their fur, which you may touch.  Remove or kill the plants.  In your yard, you can get rid of poison ivy by applying an herbicide or pulling it out of the ground, including the roots, while wearing  heavy gloves.  Afterward remove the gloves and thoroughly wash them and your hands.  Don't burn poison ivy or related plants because the urushiol can be carried by smoke.  Wear protective clothing.  If needed, protect your skin by wearing socks, boots, pants, long sleeves and vinyl gloves.  Wash your skin right away.  Washing off the oil with soap and water within 30 minutes of exposure may reduce your chances of getting a poison ivy rash.  Even washing after an hour or so can help reduce the severity of the rash.  If you walk through some poison ivy and then later touch your shoes, you may get some urushiol on your hands, which may then transfer to your face or body by touching or rubbing.  If the contaminated  object isn't cleaned, the urushiol on it can still cause a skin reaction years later.    Be careful not to reuse towels after you have washed your skin.  Also carefully wash clothing in detergent and hot water to remove all traces of the oil.  Handle contaminated clothing carefully so you don't transfer the urushiol to yourself, furniture, rugs or appliances.  Remember that pets can carry the oil on their fur and paws.  If you think your pet may be contaminated with urushiol, put on some long rubber gloves and give your pet a bath.  Finally, be careful not to burn these plants as the smoke can contain traces of the oil.  Inhaling the smoke may result in difficulty breathing. If that occurred you should see a physician as soon as possible.  See your doctor right away if:  The reaction is severe or widespread You inhaled the smoke from burning poison ivy and are having difficulty breathing Your skin continues to swell The rash affects your eyes, mouth or genitals Blisters are oozing pus You develop a fever greater than 100 F (37.8 C) The rash doesn't get better within a few weeks.  If you scratch the poison ivy rash, bacteria under your fingernails may cause the skin to become infected.  See your doctor if pus starts oozing from the blisters.  Treatment generally includes antibiotics.  Poison ivy treatments are usually limited to self-care methods.  And the rash typically goes away on its own in two to three weeks.     If the rash is widespread or results in a large number of blisters, your doctor may prescribe an oral corticosteroid, such as prednisone .  If a bacterial infection has developed at the rash site, your doctor may give you a prescription for an oral antibiotic.  MAKE SURE YOU  Understand these instructions. Will watch your condition. Will get help right away if you are not doing well or get worse.   Thank you for choosing an e-visit.  Your e-visit answers were reviewed  by a board certified advanced clinical practitioner to complete your personal care plan. Depending upon the condition, your plan could have included both over the counter or prescription medications.  Please review your pharmacy choice. Make sure the pharmacy is open so you can pick up prescription now. If there is a problem, you may contact your provider through [employer] and have the prescription routed to another pharmacy.  Your safety is important to us . If you have drug allergies check your prescription carefully.   For the next 24 hours you can use MyChart to ask questions about today's visit, request a non-urgent call back, or ask for a work  or school excuse. You will get an email in the next two days asking about your experience. I hope that your e-visit has been valuable and will speed your recovery.   Approximately 5 minutes was spent documenting and reviewing patient's chart.

## 2024-06-24 DIAGNOSIS — G43101 Migraine with aura, not intractable, with status migrainosus: Secondary | ICD-10-CM | POA: Diagnosis not present

## 2024-06-24 DIAGNOSIS — F419 Anxiety disorder, unspecified: Secondary | ICD-10-CM | POA: Diagnosis not present

## 2024-06-24 DIAGNOSIS — F902 Attention-deficit hyperactivity disorder, combined type: Secondary | ICD-10-CM | POA: Diagnosis not present

## 2024-06-24 DIAGNOSIS — F4312 Post-traumatic stress disorder, chronic: Secondary | ICD-10-CM | POA: Diagnosis not present

## 2024-06-27 ENCOUNTER — Encounter: Admitting: Family Medicine

## 2024-07-02 DIAGNOSIS — Z01419 Encounter for gynecological examination (general) (routine) without abnormal findings: Secondary | ICD-10-CM | POA: Diagnosis not present

## 2024-07-02 DIAGNOSIS — Z1331 Encounter for screening for depression: Secondary | ICD-10-CM | POA: Diagnosis not present

## 2024-07-02 DIAGNOSIS — A64 Unspecified sexually transmitted disease: Secondary | ICD-10-CM | POA: Diagnosis not present

## 2024-07-02 DIAGNOSIS — N959 Unspecified menopausal and perimenopausal disorder: Secondary | ICD-10-CM | POA: Diagnosis not present

## 2024-07-02 DIAGNOSIS — Z0000 Encounter for general adult medical examination without abnormal findings: Secondary | ICD-10-CM | POA: Diagnosis not present

## 2024-07-11 DIAGNOSIS — M180 Bilateral primary osteoarthritis of first carpometacarpal joints: Secondary | ICD-10-CM | POA: Diagnosis not present

## 2024-07-24 ENCOUNTER — Telehealth: Admitting: Physician Assistant

## 2024-07-24 DIAGNOSIS — B379 Candidiasis, unspecified: Secondary | ICD-10-CM

## 2024-07-24 DIAGNOSIS — T3695XA Adverse effect of unspecified systemic antibiotic, initial encounter: Secondary | ICD-10-CM

## 2024-07-24 DIAGNOSIS — J019 Acute sinusitis, unspecified: Secondary | ICD-10-CM

## 2024-07-24 DIAGNOSIS — B9689 Other specified bacterial agents as the cause of diseases classified elsewhere: Secondary | ICD-10-CM | POA: Diagnosis not present

## 2024-07-24 MED ORDER — FLUCONAZOLE 150 MG PO TABS: 150.0000 mg | ORAL_TABLET | ORAL | 0 refills | Status: DC | PRN

## 2024-07-24 MED ORDER — LEVOFLOXACIN 500 MG PO TABS: 500.0000 mg | ORAL_TABLET | Freq: Every day | ORAL | 0 refills | Status: AC

## 2024-07-24 NOTE — Patient Instructions (Signed)
 Alan JONELLE Dry, thank you for joining Delon CHRISTELLA Dickinson, PA-C for today's virtual visit.  While this provider is not your primary care provider (PCP), if your PCP is located in our provider database this encounter information will be shared with them immediately following your visit.   A [facility] MyChart account gives you access to today's visit and all your visits, tests, and labs performed at [facility]  click here if you don't have a  MyChart account or go to [email or web address]  Consent: (Patient) Clotine Heiner Paluch provided verbal consent for this virtual visit at the beginning of the encounter.  Current Medications:  Current Outpatient Medications:    fluconazole  (DIFLUCAN ) 150 MG tablet, Take 1 tablet (150 mg total) by mouth every 3 (three) days as needed., Disp: 3 tablet, Rfl: 0   levofloxacin  (LEVAQUIN ) 500 MG tablet, Take 1 tablet (500 mg total) by mouth daily for 7 days., Disp: 7 tablet, Rfl: 0   albuterol  (ACCUNEB ) 0.63 MG/3ML nebulizer solution, Take 1 ampule by nebulization every 6 (six) hours as needed for wheezing., Disp: , Rfl:    buPROPion  (WELLBUTRIN  XL) 300 MG 24 hr tablet, Take by mouth., Disp: , Rfl:    clonazePAM  (KLONOPIN ) 0.5 MG tablet, Take 0.5 mg by mouth daily as needed., Disp: , Rfl:    estradiol  (CLIMARA  - DOSED IN MG/24 HR) 0.025 mg/24hr patch, Place 0.025 mg onto the skin once a week., Disp: , Rfl:    furosemide  (LASIX ) 20 MG tablet, TAKE 1 TABLET BY MOUTH ONCE DAILY AS NEEDED FOR SWELLING, Disp: 90 tablet, Rfl: 1   ipratropium (ATROVENT ) 0.03 % nasal spray, Place 2 sprays into both nostrils every 12 (twelve) hours., Disp: 30 mL, Rfl: 0   LINZESS 145 MCG CAPS capsule, Take 145 mcg by mouth daily., Disp: , Rfl:    lisdexamfetamine (VYVANSE ) 70 MG capsule, Take 1 capsule (70 mg total) by mouth daily., Disp: , Rfl:    meloxicam  (MOBIC ) 15 MG tablet, TAKE 1 TABLET BY MOUTH EVERY DAY WITH THE LARGEST MEAL, Disp: 90 tablet, Rfl: 0    methocarbamol (ROBAXIN) 500 MG tablet, Take by mouth as needed., Disp: , Rfl:    mometasone  (ELOCON ) 0.1 % cream, Apply topically., Disp: , Rfl:    naratriptan  (AMERGE) 2.5 MG tablet, Take 1 tablet (2.5 mg total) by mouth as needed for migraine. Take one (1) tablet at onset of headache; if returns or does not resolve, may repeat after 4 hours; do not exceed five (5) mg in 24 hours., Disp: 10 tablet, Rfl: 5   norethindrone -ethinyl estradiol -iron (LOESTRIN FE) 1.5-30 MG-MCG tablet, Take 1 tablet by mouth daily., Disp: , Rfl:    predniSONE  (STERAPRED UNI-PAK 21 TAB) 10 MG (21) TBPK tablet, Use as directed, Disp: 21 tablet, Rfl: 0   progesterone  (PROMETRIUM ) 100 MG capsule, Take 100 mg by mouth at bedtime., Disp: , Rfl:    scopolamine  (TRANSDERM-SCOP) 1 MG/3DAYS, Place 1 patch (1.5 mg total) onto the skin every 3 (three) days., Disp: 5 patch, Rfl: 0   sertraline (ZOLOFT) 50 MG tablet, Take 25 mg by mouth daily., Disp: , Rfl:    SUMAtriptan  (IMITREX ) 100 MG tablet, TK 1 T PO AOS OF HEADACHE. REPEAT IN 2 H AS NEEDED. MAX DOSE 2 T IN 24 H, Disp: 10 tablet, Rfl: 5   terbinafine  (LAMISIL ) 250 MG tablet, TAKE 1 TABLET (250 MG TOTAL) BY MOUTH DAILY., Disp: 30 tablet, Rfl: 0   thyroid  (ARMOUR) 30 MG tablet, Take 15 mg by  mouth daily before breakfast., Disp: , Rfl:    traZODone  (DESYREL ) 50 MG tablet, Take 75 mg by mouth at bedtime., Disp: , Rfl:    triamcinolone  cream (KENALOG ) 0.1 %, Apply 1 Application topically 2 (two) times daily., Disp: 30 g, Rfl: 0   VENTOLIN  HFA 108 (90 Base) MCG/ACT inhaler, INHALE 2 PUFFS INTO THE LUNGS EVERY 6 (SIX) HOURS AS NEEDED FOR WHEEZING OR SHORTNESS OF BREATH., Disp: 18 g, Rfl: 1   Medications ordered in this encounter:  Meds ordered this encounter  Medications   levofloxacin  (LEVAQUIN ) 500 MG tablet    Sig: Take 1 tablet (500 mg total) by mouth daily for 7 days.    Dispense:  7 tablet    Refill:  0    Supervising Provider:   LAMPTEY, PHILIP O [[id number]]   fluconazole   (DIFLUCAN ) 150 MG tablet    Sig: Take 1 tablet (150 mg total) by mouth every 3 (three) days as needed.    Dispense:  3 tablet    Refill:  0    Supervising Provider:   LAMPTEY, PHILIP O [[id number]]     *If you need refills on other medications prior to your next appointment, please contact your pharmacy*  Follow-Up: Call back or seek an in-person evaluation if the symptoms worsen or if the condition fails to improve as anticipated.  [facility] [phone]  Other Instructions Sinus Infection, Adult A sinus infection, also called sinusitis, is inflammation of your sinuses. Sinuses are hollow spaces in the bones around your face. Your sinuses are located: Around your eyes. In the middle of your forehead. Behind your nose. In your cheekbones. Mucus normally drains out of your sinuses. When your nasal tissues become inflamed or swollen, mucus can become trapped or blocked. This allows bacteria, viruses, and fungi to grow, which leads to infection. Most infections of the sinuses are caused by a virus. A sinus infection can develop quickly. It can last for up to 4 weeks (acute) or for more than 12 weeks (chronic). A sinus infection often develops after a cold. What are the causes? This condition is caused by anything that creates swelling in the sinuses or stops mucus from draining. This includes: Allergies. Asthma. Infection from bacteria or viruses. Deformities or blockages in your nose or sinuses. Abnormal growths in the nose (nasal polyps). Pollutants, such as chemicals or irritants in the air. Infection from fungi. This is rare. What increases the risk? You are more likely to develop this condition if you: Have a weak body defense system (immune system). Do a lot of swimming or diving. Overuse nasal sprays. Smoke. What are the signs or symptoms? The main symptoms of this condition are pain and a feeling of pressure around the affected sinuses. Other symptoms  include: Stuffy nose or congestion that makes it difficult to breathe through your nose. Thick yellow or greenish drainage from your nose. Tenderness, swelling, and warmth over the affected sinuses. A cough that may get worse at night. Decreased sense of smell and taste. Extra mucus that collects in the throat or the back of the nose (postnasal drip) causing a sore throat or bad breath. Tiredness (fatigue). Fever. How is this diagnosed? This condition is diagnosed based on: Your symptoms. Your medical history. A physical exam. Tests to find out if your condition is acute or chronic. This may include: Checking your nose for nasal polyps. Viewing your sinuses using a device that has a light (endoscope). Testing for allergies or bacteria. Imaging  tests, such as an MRI or CT scan. In rare cases, a bone biopsy may be done to rule out more serious types of fungal sinus disease. How is this treated? Treatment for a sinus infection depends on the cause and whether your condition is chronic or acute. If caused by a virus, your symptoms should go away on their own within 10 days. You may be given medicines to relieve symptoms. They include: Medicines that shrink swollen nasal passages (decongestants). A spray that eases inflammation of the nostrils (topical intranasal corticosteroids). Rinses that help get rid of thick mucus in your nose (nasal saline washes). Medicines that treat allergies (antihistamines). Over-the-counter pain relievers. If caused by bacteria, your health care provider may recommend waiting to see if your symptoms improve. Most bacterial infections will get better without antibiotic medicine. You may be given antibiotics if you have: A severe infection. A weak immune system. If caused by narrow nasal passages or nasal polyps, surgery may be needed. Follow these instructions at home: Medicines Take, use, or apply over-the-counter and prescription medicines only as told by  your health care provider. These may include nasal sprays. If you were prescribed an antibiotic medicine, take it as told by your health care provider. Do not stop taking the antibiotic even if you start to feel better. Hydrate and humidify  Drink enough fluid to keep your urine pale yellow. Staying hydrated will help to thin your mucus. Use a cool mist humidifier to keep the humidity level in your home above 50%. Inhale steam for 10-15 minutes, 3-4 times a day, or as told by your health care provider. You can do this in the bathroom while a hot shower is running. Limit your exposure to cool or dry air. Rest Rest as much as possible. Sleep with your head raised (elevated). Make sure you get enough sleep each night. General instructions  Apply a warm, moist washcloth to your face 3-4 times a day or as told by your health care provider. This will help with discomfort. Use nasal saline washes as often as told by your health care provider. Wash your hands often with soap and water to reduce your exposure to germs. If soap and water are not available, use hand sanitizer. Do not smoke. Avoid being around people who are smoking (secondhand smoke). Keep all follow-up visits. This is important. Contact a health care provider if: You have a fever. Your symptoms get worse. Your symptoms do not improve within 10 days. Get help right away if: You have a severe headache. You have persistent vomiting. You have severe pain or swelling around your face or eyes. You have vision problems. You develop confusion. Your neck is stiff. You have trouble breathing. These symptoms may be an emergency. Get help right away. Call 911. Do not wait to see if the symptoms will go away. Do not drive yourself to the hospital. Summary A sinus infection is soreness and inflammation of your sinuses. Sinuses are hollow spaces in the bones around your face. This condition is caused by nasal tissues that become inflamed  or swollen. The swelling traps or blocks the flow of mucus. This allows bacteria, viruses, and fungi to grow, which leads to infection. If you were prescribed an antibiotic medicine, take it as told by your health care provider. Do not stop taking the antibiotic even if you start to feel better. Keep all follow-up visits. This is important. This information is not intended to replace advice given to you by your health  care provider. Make sure you discuss any questions you have with your health care provider. Document Revised: 11/02/2021 Document Reviewed: 11/02/2021 Elsevier Patient Education  2024 Elsevier Inc.   If you have been instructed to have an in-person evaluation today at a local Urgent Care facility, please use the link below. It will take you to a list of all of our available [facility], including address, phone number and hours of operation. Please do not delay care.  [facility]  If you or a family member do not have a primary care provider, use the link below to schedule a visit and establish care. When you choose a [facility] primary care physician or advanced practice provider, you gain a long-term partner in health. Find a Primary Care Provider  Learn more about [facility] in-office and virtual care options: [facility] - Get Care Now

## 2024-07-24 NOTE — Progress Notes (Signed)
 Virtual Visit Consent   [name], you are scheduled for a virtual visit with a [facility] provider today. Just as with appointments in the office, your consent must be obtained to participate. Your consent will be active for this visit and any virtual visit you may have with one of our providers in the next 365 days. If you have a MyChart account, a copy of this consent can be sent to you electronically.  As this is a virtual visit, video technology does not allow for your provider to perform a traditional examination. This may limit your provider's ability to fully assess your condition. If your provider identifies any concerns that need to be evaluated in person or the need to arrange testing (such as labs, EKG, etc.), we will make arrangements to do so. Although advances in technology are sophisticated, we cannot ensure that it will always work on either your end or our end. If the connection with a video visit is poor, the visit may have to be switched to a telephone visit. With either a video or telephone visit, we are not always able to ensure that we have a secure connection.  By engaging in this virtual visit, you consent to the provision of healthcare and authorize for your insurance to be billed (if applicable) for the services provided during this visit. Depending on your insurance coverage, you may receive a charge related to this service.  I need to obtain your verbal consent now. Are you willing to proceed with your visit today? [name] has provided verbal consent on 07/24/2024 for a virtual visit (video or telephone). Theresa CHRISTELLA Dickinson, PA-C  Date: 07/24/2024 7:49 PM   Virtual Visit via Video Note   I, [name], connected with  [name]  ([id number], September 06, 1976) on 07/24/24 at  7:45 PM EDT by a video-enabled telemedicine application and verified that I am speaking with the correct person using two identifiers.  Location: Patient: Virtual Visit  Location Patient: Home Provider: Virtual Visit Location Provider: Home Office   I discussed the limitations of evaluation and management by telemedicine and the availability of in person appointments. The patient expressed understanding and agreed to proceed.    History of Present Illness: [name] is a 48 y.o. who identifies as a female who was assigned female at birth, and is being seen today for sinusitis.  HPI: Sinusitis This is a recurrent problem. The current episode started 1 to 4 weeks ago (2 weeks; has chronic sinusits and has had multiple sinoplasty/septoplasty without any improvements)). The problem has been gradually worsening since onset. There has been no fever. The pain is moderate. Associated symptoms include congestion, ear pain (and fullness), headaches and sinus pressure. Pertinent negatives include no chills, coughing or sore throat. Treatments tried: saline nasal rinses, dayquil, sudafed, allergy medication. The treatment provided no relief.     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   Conductive hearing loss of left ear with unrestricted hearing of right ear 06/15/2021   Tinnitus of both ears 06/15/2021   Chronic fatigue 05/11/2021   Chronic gout of right hand 05/11/2021   Left elbow pain 11/30/2020   Cervical disc disorder with myelopathy of mid-cervical region 07/09/2020   Cervical radiculitis 05/13/2020   Sprain  of interphalangeal joint of finger 02/19/2020   Chronic constipation 12/31/2019   Deviated nasal septum 09/24/2019   Obstruction of nasal valve 09/24/2019   Chronic rhinitis 09/24/2019   GAD (generalized anxiety disorder) 03/15/2019   Moderate episode of recurrent major depressive disorder (HCC)  03/15/2019   Rheumatoid arthritis (HCC) 11/01/2018   Primary osteoarthritis of both hands 05/11/2018   Primary osteoarthritis of both feet 05/11/2018   Raynaud's disease without gangrene 05/11/2018   Asthma 04/20/2018   Family history of rheumatoid arthritis 04/10/2018   Dry mouth 01/23/2018   PTSD (post-traumatic stress disorder) 10/16/2017   Insomnia 10/16/2017   Migraines 10/16/2017   ADHD (attention deficit hyperactivity disorder)    Uterine leiomyoma 09/22/2017   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   Hypothyroidism 03/30/2017   Cervical spondylosis with myelopathy 06/12/2015    Allergies:  Allergies  Allergen Reactions   Penicillins Swelling    Pt states caused throat to swell    Doxycycline  Nausea And Vomiting   Medications:  Current Outpatient Medications:    fluconazole  (DIFLUCAN ) 150 MG tablet, Take 1 tablet (150 mg total) by mouth every 3 (three) days as needed., Disp: 3 tablet, Rfl: 0   levofloxacin  (LEVAQUIN ) 500 MG tablet, Take 1 tablet (500 mg total) by mouth daily for 7 days., Disp: 7 tablet, Rfl: 0   albuterol  (ACCUNEB ) 0.63 MG/3ML nebulizer solution, Take 1 ampule by nebulization every 6 (six) hours as needed for wheezing., Disp: , Rfl:    buPROPion  (WELLBUTRIN  XL) 300 MG 24 hr tablet, Take by mouth., Disp: , Rfl:    clonazePAM  (KLONOPIN ) 0.5 MG tablet, Take 0.5 mg by mouth daily as needed., Disp: , Rfl:    estradiol  (CLIMARA  - DOSED IN MG/24 HR) 0.025 mg/24hr patch, Place 0.025 mg onto the skin once a week., Disp: , Rfl:    furosemide  (LASIX ) 20 MG tablet, TAKE 1 TABLET BY MOUTH ONCE DAILY AS NEEDED FOR SWELLING, Disp: 90 tablet, Rfl: 1   ipratropium (ATROVENT ) 0.03 % nasal spray, Place 2 sprays into both nostrils every 12 (twelve) hours., Disp: 30 mL, Rfl: 0   LINZESS 145 MCG CAPS capsule, Take 145 mcg by mouth daily., Disp: , Rfl:    lisdexamfetamine (VYVANSE ) 70 MG capsule, Take 1 capsule (70 mg total) by mouth daily., Disp: , Rfl:    meloxicam  (MOBIC ) 15 MG  tablet, TAKE 1 TABLET BY MOUTH EVERY DAY WITH THE LARGEST MEAL, Disp: 90 tablet, Rfl: 0   methocarbamol (ROBAXIN) 500 MG tablet, Take by mouth as needed., Disp: , Rfl:    mometasone  (ELOCON ) 0.1 % cream, Apply topically., Disp: , Rfl:    naratriptan  (AMERGE) 2.5 MG tablet, Take 1 tablet (2.5 mg total) by mouth as needed for migraine. Take one (1) tablet at onset of headache; if returns or does not resolve, may repeat after 4 hours; do not exceed five (5) mg in 24 hours., Disp: 10 tablet, Rfl: 5   norethindrone -ethinyl estradiol -iron (LOESTRIN FE) 1.5-30 MG-MCG tablet, Take 1 tablet by mouth daily., Disp: , Rfl:    predniSONE  (STERAPRED UNI-PAK 21 TAB) 10 MG (21) TBPK tablet, Use as directed, Disp: 21 tablet, Rfl: 0   progesterone  (PROMETRIUM ) 100 MG capsule, Take 100 mg by mouth at bedtime., Disp: , Rfl:    scopolamine  (TRANSDERM-SCOP) 1 MG/3DAYS, Place 1 patch (1.5 mg total) onto the skin every 3 (three) days., Disp: 5 patch, Rfl: 0   sertraline (ZOLOFT) 50 MG tablet, Take 25 mg by mouth daily., Disp: , Rfl:  SUMAtriptan  (IMITREX ) 100 MG tablet, TK 1 T PO AOS OF HEADACHE. REPEAT IN 2 H AS NEEDED. MAX DOSE 2 T IN 24 H, Disp: 10 tablet, Rfl: 5   terbinafine  (LAMISIL ) 250 MG tablet, TAKE 1 TABLET (250 MG TOTAL) BY MOUTH DAILY., Disp: 30 tablet, Rfl: 0   thyroid  (ARMOUR) 30 MG tablet, Take 15 mg by mouth daily before breakfast., Disp: , Rfl:    traZODone  (DESYREL ) 50 MG tablet, Take 75 mg by mouth at bedtime., Disp: , Rfl:    triamcinolone  cream (KENALOG ) 0.1 %, Apply 1 Application topically 2 (two) times daily., Disp: 30 g, Rfl: 0   VENTOLIN  HFA 108 (90 Base) MCG/ACT inhaler, INHALE 2 PUFFS INTO THE LUNGS EVERY 6 (SIX) HOURS AS NEEDED FOR WHEEZING OR SHORTNESS OF BREATH., Disp: 18 g, Rfl: 1  Observations/Objective: Patient is well-developed, well-nourished in no acute distress.  Resting comfortably at home.  Head is normocephalic, atraumatic.  No labored breathing.  Speech is clear and coherent  with logical content.  Patient is alert and oriented at baseline.    Assessment and Plan: 1. Acute bacterial sinusitis (Primary) - levofloxacin  (LEVAQUIN ) 500 MG tablet; Take 1 tablet (500 mg total) by mouth daily for 7 days.  Dispense: 7 tablet; Refill: 0  2. Antibiotic-induced yeast infection - fluconazole  (DIFLUCAN ) 150 MG tablet; Take 1 tablet (150 mg total) by mouth every 3 (three) days as needed.  Dispense: 3 tablet; Refill: 0  - Worsening symptoms that have not responded to OTC medications.  - Will give Levaquin  - Diflucan  given as prophylaxis as patient tends to get vaginal yeast infections with antibiotic use - Steam and humidifier can help - Stay well hydrated and get plenty of rest.  - Seek in person evaluation if no symptom improvement or if symptoms worsen   Follow Up Instructions: I discussed the assessment and treatment plan with the patient. The patient was provided an opportunity to ask questions and all were answered. The patient agreed with the plan and demonstrated an understanding of the instructions.  A copy of instructions were sent to the patient via MyChart unless otherwise noted below.    The patient was advised to call back or seek an in-person evaluation if the symptoms worsen or if the condition fails to improve as anticipated.    Theresa CHRISTELLA Dickinson, PA-C

## 2024-08-02 ENCOUNTER — Ambulatory Visit: Payer: Self-pay

## 2024-08-02 NOTE — Telephone Encounter (Signed)
 FYI Only or Action Required?: FYI only for provider.  Patient was last seen in primary care on 05/30/2024 by Blair, Diane W, FNP.  Called Nurse Triage reporting Hypotension.  Symptoms began about a month ago.  Interventions attempted: Rest, hydration, or home remedies.  Symptoms are: gradually worsening.  Triage Disposition: See PCP When Office is Open (Within 3 Days)  Patient/caregiver understands and will follow disposition?: Yes     Copied from CRM #[id number]. Topic: Clinical - Red Word Triage >> Aug 02, 2024 11:35 AM Myrick T wrote: Red Word that prompted transfer to Nurse Triage: patient said her blood pressure has been low since Wednesday. The last time she checked it was 88/52 80 and she has been dizzy Reason for Disposition  Fall in systolic BP > 20 mmHg after standing up  Answer Assessment - Initial Assessment Questions Currently taking  BP readings on ankle 80/50, feels fatigue as well. States symptoms happen often when standing. Pt states she felt swimmy headed. States symptoms ongoing for 1 month.Pt states she has been under a lot of stress lately and had to move back home due to relationship issues.   1. BLOOD PRESSURE: What is your blood pressure? Did you take at least two measurements 5 minutes apart?     88/52 pulse 73, 86/57 pulse 84 2. ONSET: When did you take your blood pressure?     Last time time around 1140 3. HOW: How did you take your blood pressure? (e.g., visiting nurse, automatic home BP monitor)     Used at home BP cuff on wrist/ ankle  4. HISTORY: Do you have a history of low blood pressure? What is your blood pressure normally?     94/68 5. MEDICINES: Are you taking any medicines for blood pressure? If Yes, ask: Have they been changed recently?     No  6. PULSE RATE: Do you know what your pulse rate is?      States her pulse feels faint right now. Pulse readings 73, 84 7. OTHER SYMPTOMS: Have you been sick recently? Have you had  a recent injury?     Lower back pain used lidocaine  patch. Pt states she had a migraine Wednesday and took sumatriptan  for symptoms.  Protocols used: Blood Pressure - Low-A-AH

## 2024-08-06 ENCOUNTER — Encounter: Payer: Self-pay | Admitting: Family Medicine

## 2024-08-06 ENCOUNTER — Ambulatory Visit: Admitting: Family Medicine

## 2024-08-06 VITALS — BP 101/63 | HR 89 | Temp 98.3°F | Ht 67.0 in | Wt 139.8 lb

## 2024-08-06 DIAGNOSIS — Z0001 Encounter for general adult medical examination with abnormal findings: Secondary | ICD-10-CM

## 2024-08-06 DIAGNOSIS — M069 Rheumatoid arthritis, unspecified: Secondary | ICD-10-CM

## 2024-08-06 DIAGNOSIS — Z0000 Encounter for general adult medical examination without abnormal findings: Secondary | ICD-10-CM

## 2024-08-06 DIAGNOSIS — E039 Hypothyroidism, unspecified: Secondary | ICD-10-CM

## 2024-08-06 DIAGNOSIS — F411 Generalized anxiety disorder: Secondary | ICD-10-CM

## 2024-08-06 DIAGNOSIS — F331 Major depressive disorder, recurrent, moderate: Secondary | ICD-10-CM

## 2024-08-06 DIAGNOSIS — E782 Mixed hyperlipidemia: Secondary | ICD-10-CM

## 2024-08-06 DIAGNOSIS — R5383 Other fatigue: Secondary | ICD-10-CM

## 2024-08-06 DIAGNOSIS — J4540 Moderate persistent asthma, uncomplicated: Secondary | ICD-10-CM

## 2024-08-06 MED ORDER — TERBINAFINE HCL 250 MG PO TABS: 250.0000 mg | ORAL_TABLET | Freq: Every day | ORAL | 0 refills | Status: AC

## 2024-08-06 NOTE — Progress Notes (Signed)
 Complete physical exam   Patient: [name]   DOB: 01-02-1976   48 y.o. Female  MRN: [id number] Visit Date: 08/06/2024  Today's healthcare provider: Jon Eva, MD   Chief Complaint  Patient presents with   Acute Visit    Just started Wednesday blood pressure has been real low, irregular heart beat, headaches and extremed fatigue.  In the bed Wednesday, Thursday and Friday. Also legs feel really heavy, light headed and tends to be SOB.   Annual Exam    Diet- Normal Exercise- Just started back last week, lift weight and cardio Overall Feeling- Like crap Sleep- Not well since this has started Concerns- See above for Acute Visit   Subjective    [name] is a 48 y.o. female who presents today for a complete physical exam.   Discussed the use of AI scribe software for clinical note transcription with the patient, who gave verbal consent to proceed.  History of Present Illness   [name] is a 48 year old female who presents with fatigue, dizziness, and low blood pressure.  She experiences fatigue, dizziness, and low blood pressure since Wednesday, with symptoms of feeling 'swimmy headed,' dizzy, and lightheaded. These symptoms occur intermittently since her early thirties, often when standing up quickly, and have led to fainting. On Wednesday, she had to squat and sit due to dizziness and felt extreme fatigue with heavy, jelly-like legs.  Blood pressure readings taken with her father's cuff showed 84/50 and as low as 77/44, lower than her usual 110/60s. A pulse oximeter reading was 88% with a high pulse rate. She has not taken Adderall today to ensure accurate readings.  She has a history of migraines, with one occurrence in the past three months. No respiratory or urinary symptoms, and no recent bleeding. A COVID-19 test was negative, though expired. She was treated for a sinus infection with Levaquin  two weeks ago and still has ear  congestion.  She drinks water frequently and has not taken Lasix  recently. Sleep is poor since symptoms began, feeling too tired to fall asleep until exhausted. Work stress is present due to being behind on notes.  She has macrocytosis and previously required B12 injections. Menstrual cycles stopped since March, and she discontinued birth control pills. She prefers Adderall over Vyvanse  for effectiveness.        Last depression screening scores    05/12/2022    3:05 PM 11/17/2021    9:51 AM 05/11/2021    9:41 AM  PHQ 2/9 Scores  PHQ - 2 Score 3 2 2   PHQ- 9 Score 11 13 10    Last fall risk screening    05/12/2022    3:04 PM  Fall Risk   Falls in the past year? 0  Number falls in past yr: 0  Injury with Fall? 0  Risk for fall due to : History of fall(s)  Follow up Falls evaluation completed      Data saved with a previous flowsheet row definition        Medications: Outpatient Medications Prior to Visit  Medication Sig   fluconazole  (DIFLUCAN ) 150 MG tablet Take 1 tablet (150 mg total) by mouth every 3 (three) days as needed.   furosemide  (LASIX ) 20 MG tablet TAKE 1 TABLET BY MOUTH ONCE DAILY AS NEEDED FOR SWELLING (Patient taking differently: Take 20 mg by mouth as needed. TAKE 1 TABLET BY MOUTH ONCE DAILY AS NEEDED FOR SWELLING)   LINZESS 145 MCG  CAPS capsule Take 145 mcg by mouth daily.   meloxicam  (MOBIC ) 15 MG tablet TAKE 1 TABLET BY MOUTH EVERY DAY WITH THE LARGEST MEAL (Patient taking differently: Take 15 mg by mouth as needed.)   progesterone  (PROMETRIUM ) 100 MG capsule Take 100 mg by mouth at bedtime.   SUMAtriptan  (IMITREX ) 100 MG tablet TK 1 T PO AOS OF HEADACHE. REPEAT IN 2 H AS NEEDED. MAX DOSE 2 T IN 24 H   traZODone  (DESYREL ) 50 MG tablet Take 75 mg by mouth at bedtime.   VENTOLIN  HFA 108 (90 Base) MCG/ACT inhaler INHALE 2 PUFFS INTO THE LUNGS EVERY 6 (SIX) HOURS AS NEEDED FOR WHEEZING OR SHORTNESS OF BREATH.   [DISCONTINUED] albuterol  (ACCUNEB ) 0.63 MG/3ML  nebulizer solution Take 1 ampule by nebulization every 6 (six) hours as needed for wheezing.   [DISCONTINUED] buPROPion  (WELLBUTRIN  XL) 300 MG 24 hr tablet Take by mouth.   [DISCONTINUED] terbinafine  (LAMISIL ) 250 MG tablet TAKE 1 TABLET (250 MG TOTAL) BY MOUTH DAILY.   [DISCONTINUED] clonazePAM  (KLONOPIN ) 0.5 MG tablet Take 0.5 mg by mouth daily as needed.   [DISCONTINUED] estradiol  (CLIMARA  - DOSED IN MG/24 HR) 0.025 mg/24hr patch Place 0.025 mg onto the skin once a week.   [DISCONTINUED] ipratropium (ATROVENT ) 0.03 % nasal spray Place 2 sprays into both nostrils every 12 (twelve) hours.   [DISCONTINUED] lisdexamfetamine (VYVANSE ) 70 MG capsule Take 1 capsule (70 mg total) by mouth daily.   [DISCONTINUED] methocarbamol (ROBAXIN) 500 MG tablet Take by mouth as needed.   [DISCONTINUED] mometasone  (ELOCON ) 0.1 % cream Apply topically.   [DISCONTINUED] naratriptan  (AMERGE) 2.5 MG tablet Take 1 tablet (2.5 mg total) by mouth as needed for migraine. Take one (1) tablet at onset of headache; if returns or does not resolve, may repeat after 4 hours; do not exceed five (5) mg in 24 hours.   [DISCONTINUED] norethindrone -ethinyl estradiol -iron (LOESTRIN FE) 1.5-30 MG-MCG tablet Take 1 tablet by mouth daily.   [DISCONTINUED] predniSONE  (STERAPRED UNI-PAK 21 TAB) 10 MG (21) TBPK tablet Use as directed   [DISCONTINUED] scopolamine  (TRANSDERM-SCOP) 1 MG/3DAYS Place 1 patch (1.5 mg total) onto the skin every 3 (three) days.   [DISCONTINUED] sertraline (ZOLOFT) 50 MG tablet Take 25 mg by mouth daily.   [DISCONTINUED] thyroid  (ARMOUR) 30 MG tablet Take 15 mg by mouth daily before breakfast.   [DISCONTINUED] triamcinolone  cream (KENALOG ) 0.1 % Apply 1 Application topically 2 (two) times daily.   No facility-administered medications prior to visit.    Review of Systems    Objective    BP 101/63 (BP Location: Left Arm, Patient Position: Sitting, Cuff Size: Normal)   Pulse 89   Temp 98.3 F (36.8 C) (Oral)    Ht 5' 7 (1.702 m)   Wt 139 lb 12.8 oz (63.4 kg)   SpO2 100%   BMI 21.90 kg/m    Physical Exam Vitals reviewed.  Constitutional:      General: She is not in acute distress.    Appearance: Normal appearance. She is well-developed. She is not diaphoretic.  HENT:     Head: Normocephalic and atraumatic.     Right Ear: Tympanic membrane, ear canal and external ear normal.     Left Ear: Tympanic membrane, ear canal and external ear normal.     Nose: Nose normal.     Mouth/Throat:     Mouth: Mucous membranes are moist.     Pharynx: Oropharynx is clear. No oropharyngeal exudate.  Eyes:     General: No scleral icterus.  Conjunctiva/sclera: Conjunctivae normal.     Pupils: Pupils are equal, round, and reactive to light.  Neck:     Thyroid : No thyromegaly.  Cardiovascular:     Rate and Rhythm: Normal rate and regular rhythm.     Heart sounds: Normal heart sounds. No murmur heard. Pulmonary:     Effort: Pulmonary effort is normal. No respiratory distress.     Breath sounds: Normal breath sounds. No wheezing or rales.  Abdominal:     General: There is no distension.     Palpations: Abdomen is soft.     Tenderness: There is no abdominal tenderness.  Musculoskeletal:        General: No deformity.     Cervical back: Neck supple.     Right lower leg: No edema.     Left lower leg: No edema.  Lymphadenopathy:     Cervical: No cervical adenopathy.  Skin:    General: Skin is warm and dry.     Findings: No rash.  Neurological:     Mental Status: She is alert and oriented to person, place, and time. Mental status is at baseline.     Gait: Gait normal.  Psychiatric:        Mood and Affect: Mood normal.        Behavior: Behavior normal.        Thought Content: Thought content normal.      No results found for any visits on 08/06/24.  Assessment & Plan    Routine Health Maintenance and Physical Exam  Exercise Activities and Dietary recommendations  Goals   None      Immunization History  Administered Date(s) Administered   Hepatitis A, Adult 10/26/2018   Moderna Sars-Covid-2 Vaccination 06/26/2020   Tdap 05/11/2021    Health Maintenance  Topic Date Due   Hepatitis B Vaccines 19-59 Average Risk (1 of 3 - 19+ 3-dose series) Never done   COVID-19 Vaccine (2 - Moderna risk series) 07/24/2020   Cervical Cancer Screening (HPV/Pap Cotest)  12/22/2022   MAMMOGRAM  03/09/2023   INFLUENZA VACCINE  03/11/2025 (Originally 07/12/2024)   DTaP/Tdap/Td (2 - Td or Tdap) 05/12/2031   Colonoscopy  01/23/2033   Hepatitis C Screening  Completed   HIV Screening  Completed   HPV VACCINES  Aged Out   Meningococcal B Vaccine  Aged Out   Pneumococcal Vaccine  Discontinued    Discussed health benefits of physical activity, and encouraged her to engage in regular exercise appropriate for her age and condition.  Problem List Items Addressed This Visit       Respiratory   Asthma     Endocrine   RESOLVED: Hypothyroidism   Relevant Orders   TSH     Musculoskeletal and Integument   Rheumatoid arthritis (HCC)     Other   Moderate episode of recurrent major depressive disorder (HCC)   RESOLVED: GAD (generalized anxiety disorder)   RESOLVED: Moderate mixed hyperlipidemia not requiring statin therapy   Relevant Orders   Comprehensive metabolic panel with GFR   Lipid panel   Other Visit Diagnoses       Encounter for annual physical exam    -  Primary   Relevant Orders   Comprehensive metabolic panel with GFR   CBC w/Diff/Platelet   Lipid panel   TSH   B12   VITAMIN D  25 Hydroxy (Vit-D Deficiency, Fractures)     Malaise and fatigue       Relevant Orders   Comprehensive metabolic panel with  GFR   CBC w/Diff/Platelet   TSH   B12   VITAMIN D  25 Hydroxy (Vit-D Deficiency, Fractures)           Adult Wellness Visit Routine wellness visit conducted. Recent mammogram and Pap smear results were normal. Reviewed vaccination history, including hepatitis  B vaccination [place] [facility]. Discussed the need for a flu shot in the upcoming season. - Pull mammogram and Pap smear results into the chart - Order flu shot when available  Symptomatic hypotension with fatigue and dizziness Recent onset of symptomatic hypotension with associated fatigue and dizziness. Blood pressure readings as low as 77/44 mmHg. Symptoms include lightheadedness, exhaustion, and difficulty ascending stairs. Differential diagnosis includes dehydration, infection, and anemia. No current medications contributing to hypotension. No respiratory or urinary symptoms suggestive of infection. Negative COVID test, though expired. History of macrocytosis noted. Labs will help clarify the cause. - Order CBC to assess for anemia and infection - Order CMP to evaluate kidney and liver function, electrolytes, and blood sugar - Order thyroid  function tests - Order B12 and vitamin D  levels  Chronic macrocytosis Chronic macrocytosis with no recent changes in condition. To be evaluated further with current blood work. - Evaluate macrocytosis with CBC results  Chronic sinus symptoms post-sinus infection Persistent sinus symptoms following a recent sinus infection treated with Levaquin . Symptoms include ear congestion and residual sinus issues. - Evaluate sinus symptoms during physical examination  Attention deficit disorder, currently off medication Attention deficit disorder, currently not on medication. Previously tried Vyvanse  without success, prefers Adderall. Plans to resume Adderall pending receipt of last note from previous provider. - Obtain last note from previous provider for Adderall prescription - Schedule follow-up visit for Adderall management every four months  Menopausal transition (amenorrhea since March) Amenorrhea since March, likely related to menopausal transition.       Return in about 1 year (around 08/06/2025) for CPE.      Jon Eva, MD  [facility] [phone] (phone) [phone] (fax)  [facility]

## 2024-08-07 ENCOUNTER — Encounter: Payer: Self-pay | Admitting: Family Medicine

## 2024-08-07 DIAGNOSIS — R002 Palpitations: Secondary | ICD-10-CM

## 2024-08-08 ENCOUNTER — Telehealth: Payer: Self-pay

## 2024-08-08 NOTE — Telephone Encounter (Signed)
     Copied from CRM [id number]. Topic: General - Other >> Aug 07, 2024  5:21 PM [name] wrote: Reason for CRM: Patient is calling in because she has to come in for labs tomorrow and she wants to know if she can go ahead and get the heart monitor put on after or before her labs. Patient says Dr. B requested for her to wear one, and she doesn't want to wait.

## 2024-08-08 NOTE — Telephone Encounter (Signed)
 Zio order has been placed and the pt is aware

## 2024-08-08 NOTE — Telephone Encounter (Signed)
 We didn't talk about a heart monitor. We said we were going to start with labs

## 2024-08-08 NOTE — Telephone Encounter (Addendum)
 Patient returned call, please cb

## 2024-08-08 NOTE — Telephone Encounter (Signed)
 Left message for patient to call  I don't see that a monitor was ordered-do you what she is talking about?

## 2024-08-08 NOTE — Telephone Encounter (Signed)
 See phone note - we didn't discuss the monitor, but can go ahead and order Zio patch x14 d.  Let her know this will mail to the house and she self-applies.

## 2024-09-04 ENCOUNTER — Encounter: Payer: Self-pay | Admitting: Family Medicine

## 2024-09-05 NOTE — Telephone Encounter (Signed)
 Please check on the status by calling Zio

## 2024-09-06 ENCOUNTER — Ambulatory Visit: Attending: Family Medicine

## 2024-09-06 ENCOUNTER — Other Ambulatory Visit: Payer: Self-pay

## 2024-09-06 DIAGNOSIS — R002 Palpitations: Secondary | ICD-10-CM

## 2024-09-06 NOTE — Progress Notes (Unsigned)
 EP to read.

## 2024-09-08 ENCOUNTER — Encounter: Payer: Self-pay | Admitting: Family Medicine

## 2024-09-08 DIAGNOSIS — F909 Attention-deficit hyperactivity disorder, unspecified type: Secondary | ICD-10-CM

## 2024-09-08 DIAGNOSIS — F411 Generalized anxiety disorder: Secondary | ICD-10-CM

## 2024-09-08 DIAGNOSIS — F5101 Primary insomnia: Secondary | ICD-10-CM

## 2024-09-08 DIAGNOSIS — F331 Major depressive disorder, recurrent, moderate: Secondary | ICD-10-CM

## 2024-09-09 MED ORDER — GABAPENTIN 300 MG PO CAPS: 300.0000 mg | ORAL_CAPSULE | Freq: Two times a day (BID) | ORAL | 1 refills | Status: AC | PRN

## 2024-09-09 MED ORDER — BUPROPION HCL ER (SR) 100 MG PO TB12: 200.0000 mg | ORAL_TABLET | Freq: Every day | ORAL | 1 refills | Status: AC

## 2024-09-09 MED ORDER — AMPHETAMINE-DEXTROAMPHETAMINE 20 MG PO TABS: 20.0000 mg | ORAL_TABLET | Freq: Two times a day (BID) | ORAL | 0 refills | Status: AC

## 2024-09-09 MED ORDER — CLONAZEPAM 0.5 MG PO TABS: 0.5000 mg | ORAL_TABLET | Freq: Two times a day (BID) | ORAL | 2 refills | Status: AC | PRN

## 2024-09-09 MED ORDER — TRAZODONE HCL 150 MG PO TABS: 75.0000 mg | ORAL_TABLET | Freq: Every evening | ORAL | 1 refills | Status: AC | PRN

## 2024-09-09 MED ORDER — SUMATRIPTAN SUCCINATE 100 MG PO TABS: ORAL_TABLET | ORAL | 5 refills | Status: AC

## 2024-09-24 ENCOUNTER — Telehealth: Admitting: Physician Assistant

## 2024-09-24 DIAGNOSIS — B379 Candidiasis, unspecified: Secondary | ICD-10-CM | POA: Diagnosis not present

## 2024-09-24 DIAGNOSIS — J019 Acute sinusitis, unspecified: Secondary | ICD-10-CM | POA: Diagnosis not present

## 2024-09-24 DIAGNOSIS — T3695XA Adverse effect of unspecified systemic antibiotic, initial encounter: Secondary | ICD-10-CM | POA: Diagnosis not present

## 2024-09-24 DIAGNOSIS — B9689 Other specified bacterial agents as the cause of diseases classified elsewhere: Secondary | ICD-10-CM | POA: Diagnosis not present

## 2024-09-25 MED ORDER — FLUCONAZOLE 150 MG PO TABS: 150.0000 mg | ORAL_TABLET | ORAL | 0 refills | Status: DC | PRN

## 2024-09-25 MED ORDER — LEVOFLOXACIN 500 MG PO TABS: 500.0000 mg | ORAL_TABLET | Freq: Every day | ORAL | 0 refills | Status: AC

## 2024-09-25 NOTE — Progress Notes (Signed)
 E-Visit for Sinus Problems  We are sorry that you are not feeling well.  Here is how we plan to help!  Based on what you have shared with me it looks like you have sinusitis.  Sinusitis is inflammation and infection in the sinus cavities of the head.  Based on your presentation I believe you most likely have Acute Bacterial Sinusitis.  This is an infection caused by bacteria and is treated with antibiotics. I have prescribed Levofloxicin 500mg  by mouth once daily for 7 days. You may use an oral decongestant such as Mucinex D or if you have glaucoma or high blood pressure use plain Mucinex. Saline nasal spray help and can safely be used as often as needed for congestion.  If you develop worsening sinus pain, fever or notice severe headache and vision changes, or if symptoms are not better after completion of antibiotic, please schedule an appointment with a health care provider.    Sinus infections are not as easily transmitted as other respiratory infection, however we still recommend that you avoid close contact with loved ones, especially the very young and elderly.  Remember to wash your hands thoroughly throughout the day as this is the number one way to prevent the spread of infection!  Diflucan  given as prophylaxis as patient tends to get vaginal yeast infections with antibiotic use.  Home Care: Only take medications as instructed by your medical team. Complete the entire course of an antibiotic. Do not take these medications with alcohol. A steam or ultrasonic humidifier can help congestion.  You can place a towel over your head and breathe in the steam from hot water coming from a faucet. Avoid close contacts especially the very young and the elderly. Cover your mouth when you cough or sneeze. Always remember to wash your hands.  Get Help Right Away If: You develop worsening fever or sinus pain. You develop a severe head ache or visual changes. Your symptoms persist after you have  completed your treatment plan.  Make sure you Understand these instructions. Will watch your condition. Will get help right away if you are not doing well or get worse.  Your e-visit answers were reviewed by a board certified advanced clinical practitioner to complete your personal care plan.  Depending on the condition, your plan could have included both over the counter or prescription medications.  If there is a problem please reply  once you have received a response from your provider.  Your safety is important to us .  If you have drug allergies check your prescription carefully.    You can use MyChart to ask questions about today's visit, request a non-urgent call back, or ask for a work or school excuse for 24 hours related to this e-Visit. If it has been greater than 24 hours you will need to follow up with your provider, or enter a new e-Visit to address those concerns.  You will get an e-mail in the next two days asking about your experience.  I hope that your e-visit has been valuable and will speed your recovery. Thank you for using e-visits.  I have spent 5 minutes in review of e-visit questionnaire, review and updating patient chart, medical decision making and response to patient.   Delon CHRISTELLA Dickinson, PA-C

## 2024-10-24 ENCOUNTER — Other Ambulatory Visit: Payer: Self-pay | Admitting: Family Medicine

## 2024-11-06 ENCOUNTER — Telehealth: Admitting: Family Medicine

## 2024-11-06 DIAGNOSIS — J019 Acute sinusitis, unspecified: Secondary | ICD-10-CM

## 2024-11-06 DIAGNOSIS — T3695XA Adverse effect of unspecified systemic antibiotic, initial encounter: Secondary | ICD-10-CM | POA: Diagnosis not present

## 2024-11-06 DIAGNOSIS — B379 Candidiasis, unspecified: Secondary | ICD-10-CM

## 2024-11-06 DIAGNOSIS — B9689 Other specified bacterial agents as the cause of diseases classified elsewhere: Secondary | ICD-10-CM | POA: Diagnosis not present

## 2024-11-08 MED ORDER — LEVOFLOXACIN 500 MG PO TABS: 500.0000 mg | ORAL_TABLET | Freq: Every day | ORAL | 0 refills | Status: AC

## 2024-11-08 MED ORDER — PREDNISONE 10 MG (21) PO TBPK: ORAL_TABLET | ORAL | 0 refills | Status: AC

## 2024-11-08 MED ORDER — FLUCONAZOLE 150 MG PO TABS: 150.0000 mg | ORAL_TABLET | ORAL | 0 refills | Status: AC | PRN

## 2024-11-08 NOTE — Progress Notes (Signed)
 E-Visit for Sinus Problems  We are sorry that you are not feeling well.  Here is how we plan to help!  Based on what you have shared with me it looks like you have sinusitis.  Sinusitis is inflammation and infection in the sinus cavities of the head.  Based on your presentation I believe you most likely have Acute Bacterial Sinusitis.  This is an infection caused by bacteria and is treated with antibiotics. I have prescribed Levofloxicin 500mg  by mouth once daily for 7 days. You may use an oral decongestant such as Mucinex D or if you have glaucoma or high blood pressure use plain Mucinex. Saline nasal spray help and can safely be used as often as needed for congestion.  If you develop worsening sinus pain, fever or notice severe headache and vision changes, or if symptoms are not better after completion of antibiotic, please schedule an appointment with a health care provider.  Prednisone  and fluconazole  also sent to the pharmacy.   Sinus infections are not as easily transmitted as other respiratory infection, however we still recommend that you avoid close contact with loved ones, especially the very young and elderly.  Remember to wash your hands thoroughly throughout the day as this is the number one way to prevent the spread of infection!  Home Care: Only take medications as instructed by your medical team. Complete the entire course of an antibiotic. Do not take these medications with alcohol. A steam or ultrasonic humidifier can help congestion.  You can place a towel over your head and breathe in the steam from hot water coming from a faucet. Avoid close contacts especially the very young and the elderly. Cover your mouth when you cough or sneeze. Always remember to wash your hands.  Get Help Right Away If: You develop worsening fever or sinus pain. You develop a severe head ache or visual changes. Your symptoms persist after you have completed your treatment plan.  Make sure  you Understand these instructions. Will watch your condition. Will get help right away if you are not doing well or get worse.  Your e-visit answers were reviewed by a board certified advanced clinical practitioner to complete your personal care plan.  Depending on the condition, your plan could have included both over the counter or prescription medications.  If there is a problem please reply  once you have received a response from your provider.  Your safety is important to us .  If you have drug allergies check your prescription carefully.    You can use MyChart to ask questions about today's visit, request a non-urgent call back, or ask for a work or school excuse for 24 hours related to this e-Visit. If it has been greater than 24 hours you will need to follow up with your provider, or enter a new e-Visit to address those concerns.  You will get an e-mail in the next two days asking about your experience.  I hope that your e-visit has been valuable and will speed your recovery. Thank you for using e-visits.  I have spent 5 minutes in review of e-visit questionnaire, review and updating patient chart, medical decision making and response to patient.   Roosvelt Mater, PA-C

## 2024-11-24 ENCOUNTER — Encounter

## 2025-01-15 ENCOUNTER — Other Ambulatory Visit: Payer: Self-pay | Admitting: Obstetrics and Gynecology

## 2025-01-15 DIAGNOSIS — Z1231 Encounter for screening mammogram for malignant neoplasm of breast: Secondary | ICD-10-CM

## 2025-02-05 ENCOUNTER — Encounter

## 2025-08-11 ENCOUNTER — Encounter: Admitting: Family Medicine
